# Patient Record
Sex: Female | Born: 1940
Health system: Southern US, Community
[De-identification: ages and names within clinical notes are randomized; demographics above are authoritative.]

## PROBLEM LIST (undated history)

## (undated) DIAGNOSIS — F329 Major depressive disorder, single episode, unspecified: Secondary | ICD-10-CM

## (undated) DIAGNOSIS — E785 Hyperlipidemia, unspecified: Secondary | ICD-10-CM

## (undated) DIAGNOSIS — F32A Depression, unspecified: Secondary | ICD-10-CM

## (undated) DIAGNOSIS — R413 Other amnesia: Secondary | ICD-10-CM

## (undated) DIAGNOSIS — I10 Essential (primary) hypertension: Secondary | ICD-10-CM

## (undated) HISTORY — DX: Hyperlipidemia, unspecified: E78.5

## (undated) HISTORY — DX: Depression, unspecified: F32.A

## (undated) HISTORY — DX: Major depressive disorder, single episode, unspecified: F32.9

## (undated) HISTORY — DX: Essential (primary) hypertension: I10

## (undated) HISTORY — DX: Other amnesia: R41.3

---

## 2004-11-10 ENCOUNTER — Ambulatory Visit: Payer: Self-pay | Admitting: Family Medicine

## 2005-11-30 ENCOUNTER — Ambulatory Visit: Payer: Self-pay | Admitting: Family Medicine

## 2005-12-26 ENCOUNTER — Emergency Department: Payer: Self-pay | Admitting: General Practice

## 2005-12-27 ENCOUNTER — Other Ambulatory Visit: Payer: Self-pay

## 2006-04-18 ENCOUNTER — Emergency Department: Payer: Self-pay | Admitting: Emergency Medicine

## 2006-07-07 ENCOUNTER — Ambulatory Visit: Payer: Self-pay | Admitting: Gastroenterology

## 2007-01-03 LAB — HM PAP SMEAR

## 2007-01-11 ENCOUNTER — Ambulatory Visit: Payer: Self-pay | Admitting: Family Medicine

## 2008-02-14 ENCOUNTER — Ambulatory Visit: Payer: Self-pay | Admitting: Family Medicine

## 2009-02-14 ENCOUNTER — Ambulatory Visit: Payer: Self-pay | Admitting: Family Medicine

## 2009-12-18 ENCOUNTER — Ambulatory Visit: Payer: Self-pay | Admitting: Internal Medicine

## 2010-01-16 ENCOUNTER — Ambulatory Visit: Payer: Self-pay | Admitting: Internal Medicine

## 2010-01-17 ENCOUNTER — Ambulatory Visit: Payer: Self-pay | Admitting: Internal Medicine

## 2010-01-27 ENCOUNTER — Ambulatory Visit: Payer: Self-pay | Admitting: Internal Medicine

## 2010-02-16 ENCOUNTER — Ambulatory Visit: Payer: Self-pay | Admitting: Family Medicine

## 2010-02-17 ENCOUNTER — Ambulatory Visit: Payer: Self-pay | Admitting: Internal Medicine

## 2010-03-18 ENCOUNTER — Ambulatory Visit: Payer: Self-pay | Admitting: Gastroenterology

## 2010-03-23 ENCOUNTER — Ambulatory Visit: Payer: Self-pay | Admitting: Gastroenterology

## 2010-04-23 ENCOUNTER — Ambulatory Visit: Payer: Self-pay | Admitting: Internal Medicine

## 2010-05-20 ENCOUNTER — Ambulatory Visit: Payer: Self-pay | Admitting: Internal Medicine

## 2010-07-23 ENCOUNTER — Ambulatory Visit: Payer: Self-pay | Admitting: Internal Medicine

## 2010-08-18 ENCOUNTER — Ambulatory Visit: Payer: Self-pay | Admitting: Internal Medicine

## 2010-08-20 ENCOUNTER — Ambulatory Visit: Payer: Self-pay | Admitting: Family Medicine

## 2010-08-21 ENCOUNTER — Ambulatory Visit: Payer: Self-pay | Admitting: Internal Medicine

## 2011-01-21 ENCOUNTER — Ambulatory Visit: Payer: Self-pay | Admitting: Internal Medicine

## 2011-02-16 ENCOUNTER — Ambulatory Visit: Payer: Self-pay | Admitting: Gastroenterology

## 2011-02-18 ENCOUNTER — Ambulatory Visit: Payer: Self-pay | Admitting: Internal Medicine

## 2011-04-26 ENCOUNTER — Ambulatory Visit: Payer: Self-pay | Admitting: Family Medicine

## 2011-07-22 ENCOUNTER — Ambulatory Visit: Payer: Self-pay | Admitting: Internal Medicine

## 2011-07-22 LAB — CBC CANCER CENTER
Basophil #: 0 x10 3/mm (ref 0.0–0.1)
Basophil %: 1.3 %
Eosinophil #: 0.1 x10 3/mm (ref 0.0–0.7)
Eosinophil %: 2 %
HCT: 42 % (ref 35.0–47.0)
HGB: 14.5 g/dL (ref 12.0–16.0)
Lymphocyte #: 1 x10 3/mm (ref 1.0–3.6)
Lymphocyte %: 28.9 %
MCHC: 34.6 g/dL (ref 32.0–36.0)
Monocyte #: 0.5 x10 3/mm (ref 0.0–0.7)
Neutrophil #: 1.9 x10 3/mm (ref 1.4–6.5)
Neutrophil %: 54.6 %
Platelet: 108 x10 3/mm — ABNORMAL LOW (ref 150–440)
RBC: 4.6 10*6/uL (ref 3.80–5.20)
RDW: 14.5 % (ref 11.5–14.5)
WBC: 3.5 x10 3/mm — ABNORMAL LOW (ref 3.6–11.0)

## 2011-07-22 LAB — COMPREHENSIVE METABOLIC PANEL
Alkaline Phosphatase: 58 U/L (ref 50–136)
Anion Gap: 8 (ref 7–16)
BUN: 9 mg/dL (ref 7–18)
Bilirubin,Total: 0.5 mg/dL (ref 0.2–1.0)
Chloride: 98 mmol/L (ref 98–107)
Creatinine: 0.61 mg/dL (ref 0.60–1.30)
EGFR (Non-African Amer.): >60
Glucose: 76 mg/dL (ref 65–99)
Sodium: 137 mmol/L (ref 136–145)
Total Protein: 7.6 g/dL (ref 6.4–8.2)

## 2011-08-18 ENCOUNTER — Ambulatory Visit: Payer: Self-pay | Admitting: Internal Medicine

## 2011-08-19 LAB — CBC CANCER CENTER
Basophil %: 0.6 %
Eosinophil #: 0.1 x10 3/mm (ref 0.0–0.7)
Eosinophil %: 1.7 %
HGB: 14.1 g/dL (ref 12.0–16.0)
Lymphocyte #: 1.2 x10 3/mm (ref 1.0–3.6)
Lymphocyte %: 28.3 %
MCV: 92 fL (ref 80–100)
Monocyte #: 0.6 x10 3/mm (ref 0.2–0.9)
Neutrophil #: 2.3 x10 3/mm (ref 1.4–6.5)
RBC: 4.6 10*6/uL (ref 3.80–5.20)
RDW: 13.7 % (ref 11.5–14.5)

## 2011-09-18 ENCOUNTER — Ambulatory Visit: Payer: Self-pay | Admitting: Internal Medicine

## 2011-10-20 ENCOUNTER — Ambulatory Visit: Payer: Self-pay | Admitting: Internal Medicine

## 2011-10-20 LAB — CBC CANCER CENTER
Basophil %: 0.6 %
Eosinophil #: 0.1 x10 3/mm (ref 0.0–0.7)
Eosinophil %: 1.8 %
HGB: 14.2 g/dL (ref 12.0–16.0)
Lymphocyte #: 0.8 x10 3/mm — ABNORMAL LOW (ref 1.0–3.6)
Lymphocyte %: 24.7 %
MCV: 92 fL (ref 80–100)
Monocyte #: 0.5 x10 3/mm (ref 0.2–0.9)
Neutrophil %: 58.9 %
Platelet: 107 x10 3/mm — ABNORMAL LOW (ref 150–440)
RBC: 4.65 10*6/uL (ref 3.80–5.20)
WBC: 3.3 x10 3/mm — ABNORMAL LOW (ref 3.6–11.0)

## 2011-10-20 LAB — HEPATIC FUNCTION PANEL A (ARMC)
Alkaline Phosphatase: 53 U/L (ref 50–136)
Bilirubin, Direct: 0.2 mg/dL (ref 0.00–0.20)
Bilirubin,Total: 0.6 mg/dL (ref 0.2–1.0)
SGOT(AST): 44 U/L — ABNORMAL HIGH (ref 15–37)
SGPT (ALT): 49 U/L
Total Protein: 7.5 g/dL (ref 6.4–8.2)

## 2011-11-18 ENCOUNTER — Ambulatory Visit: Payer: Self-pay | Admitting: Internal Medicine

## 2011-12-21 IMAGING — US ABDOMEN ULTRASOUND
1 series · 17 of 25 positions shown · non-contrast
Comparison: none

REASON FOR EXAM: abn lfts leg edema eval for liver or spleen abnormality
COMMENTS:

[Series 1: abdomen ultrasound · 17 of 55 slices shown]
[im 1/55]
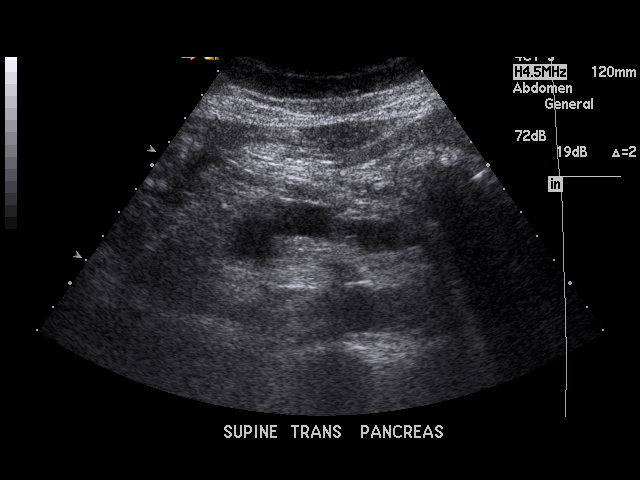
[im 5/55]
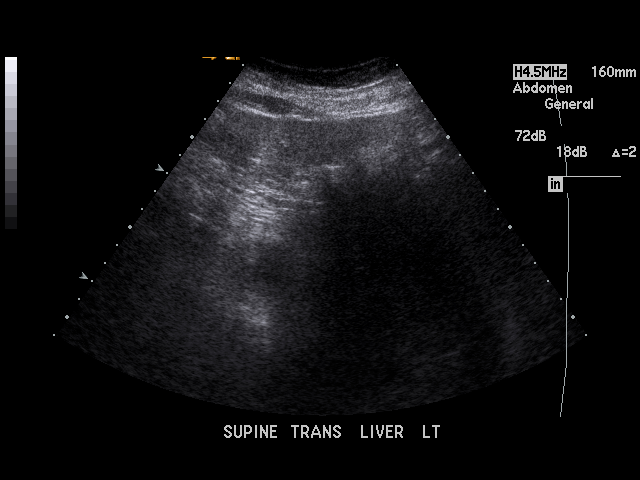
[im 7/55]
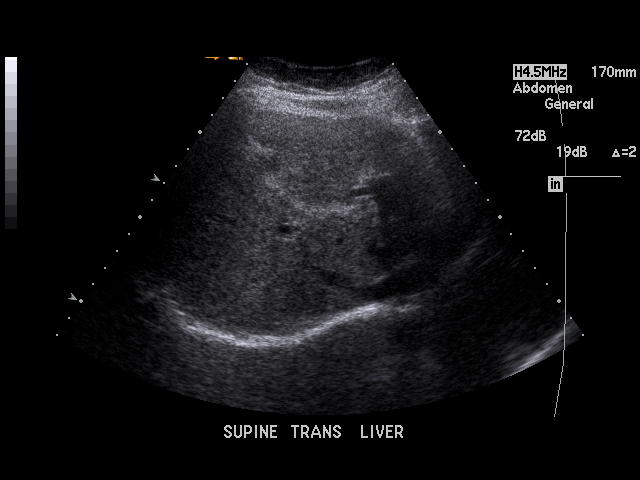
[im 12/55]
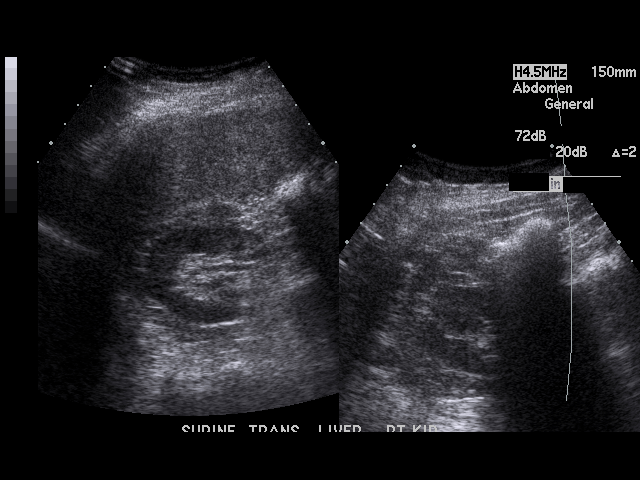
[im 14/55]
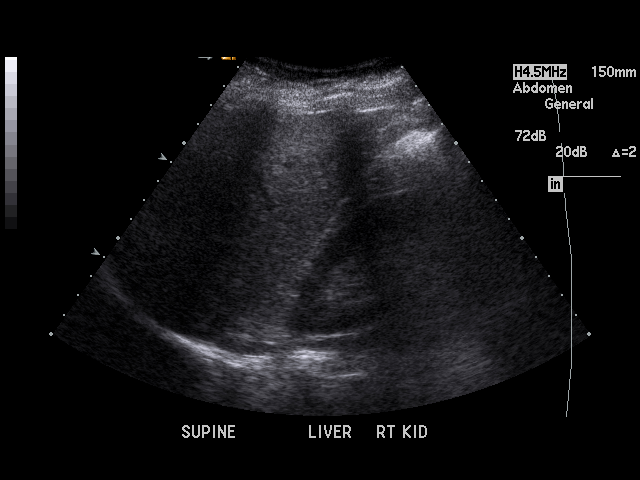
[im 19/55]
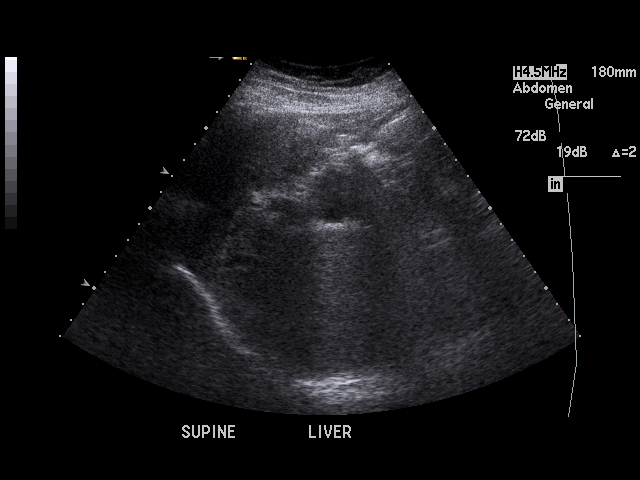
[im 21/55]
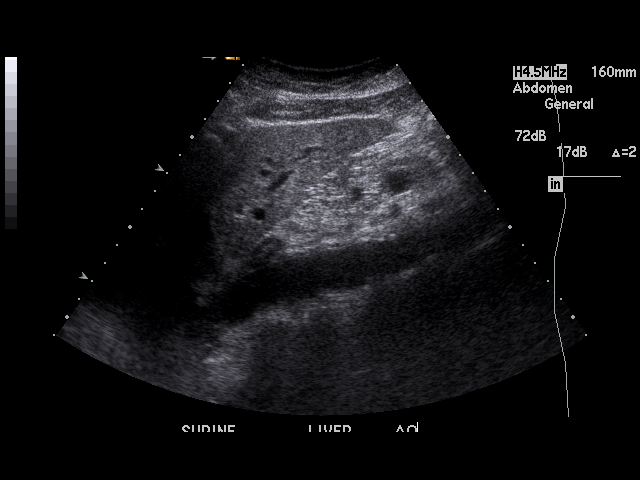
[im 25/55]
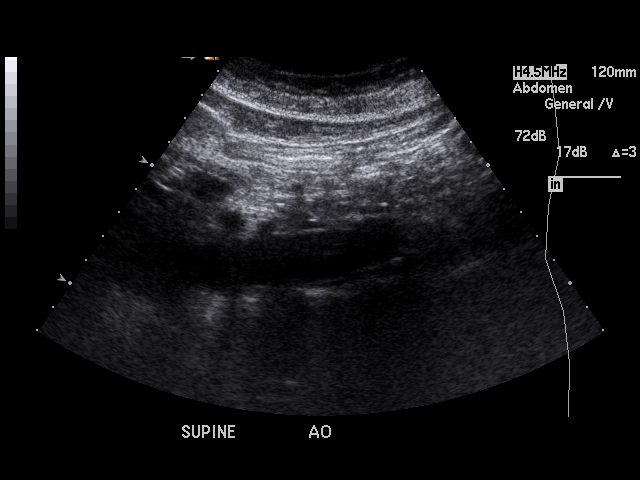
[im 28/55]
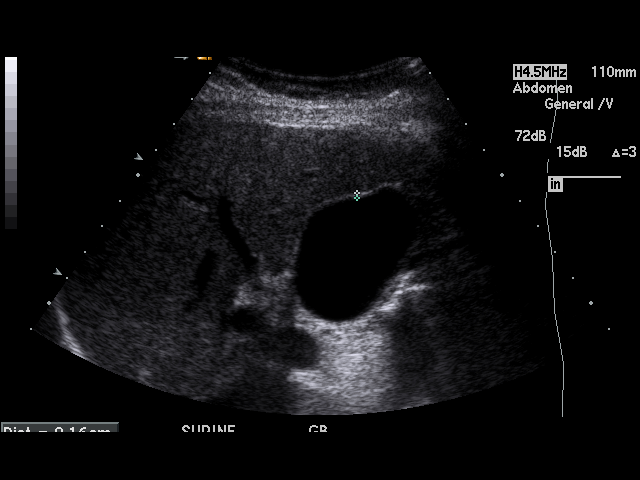
[im 30/55]
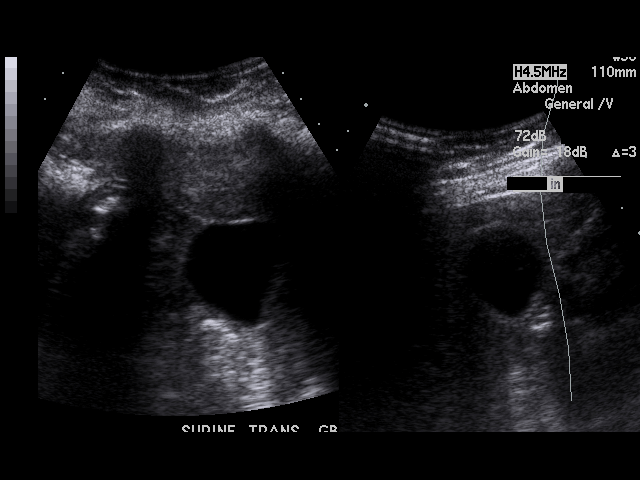
[im 34/55]
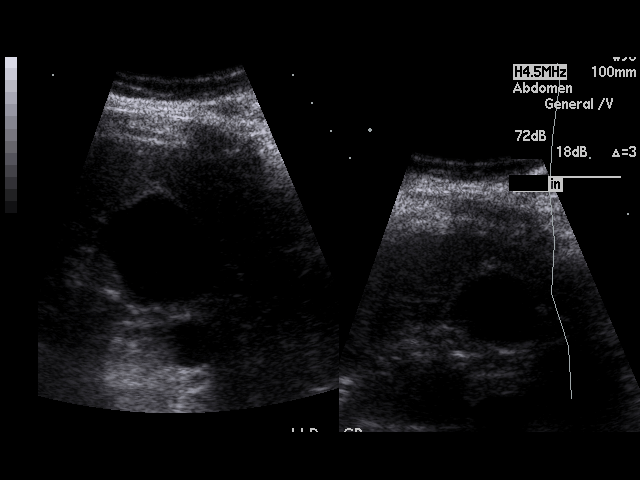
[im 37/55]
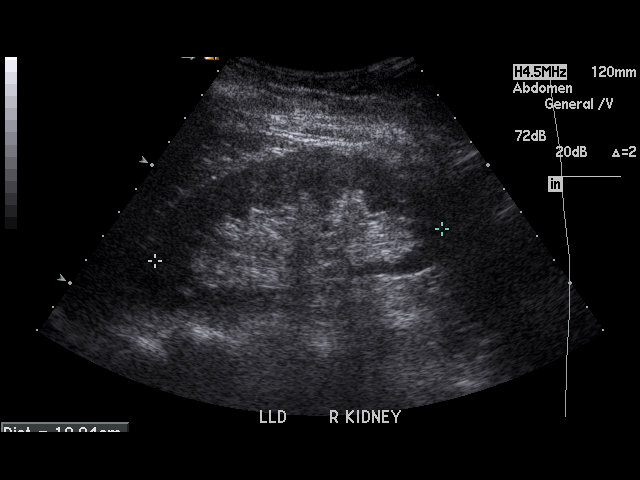
[im 41/55]
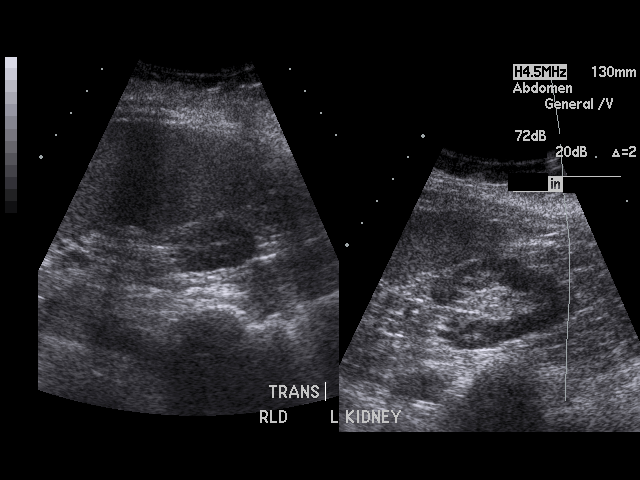
[im 43/55]
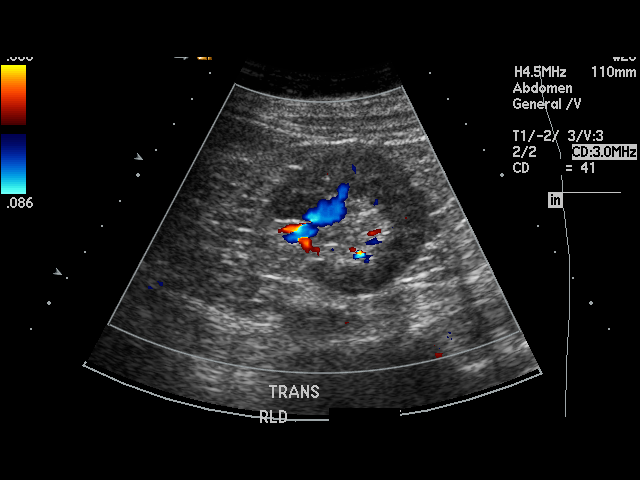
[im 48/55]
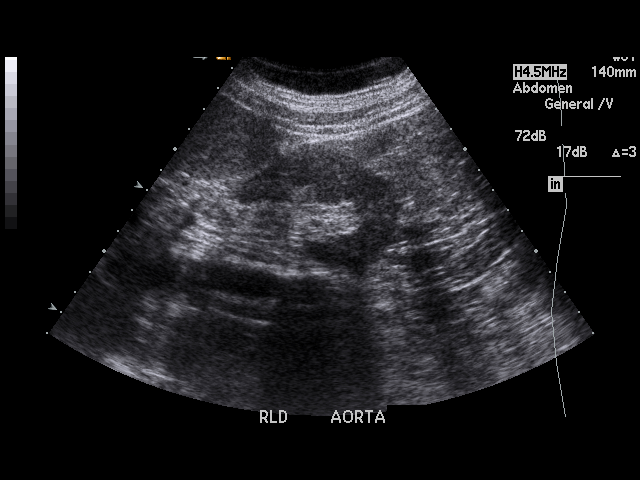
[im 50/55]
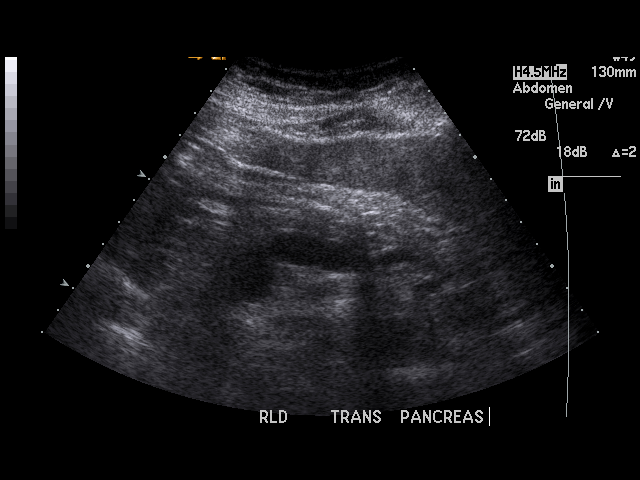
[im 55/55]
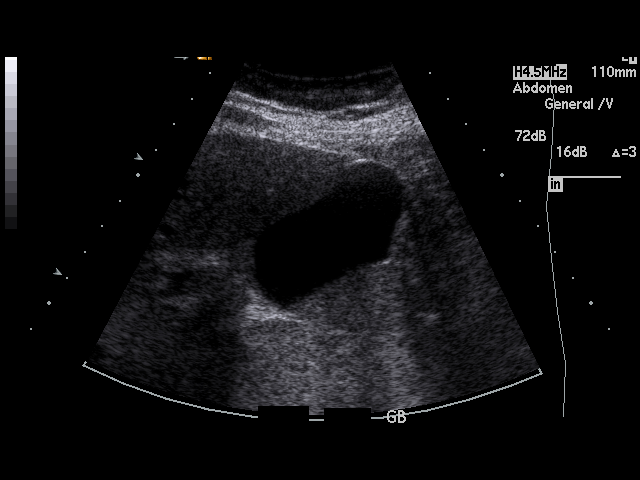

[17 of 25 positions shown; findings below may reference images not displayed]

PROCEDURE:     US  - US ABDOMEN GENERAL SURVEY  - January 27, 2010 [DATE]

RESULT:     The liver exhibits mildly increased echotexture which suggests
fatty infiltrative change. There is no focal mass nor ductal dilation.
Portal venous flow is normal in direction toward the liver. The gallbladder
is adequately distended with no evidence of stones, wall thickening, or
pericholecystic fluid. There is no positive sonographic Murphy's sign. The
common bile duct is normal at just under 3 mm in diameter. Portions of the
pancreas are obscured by bowel gas but the visualized portions appear normal.

There is splenomegaly with maximal measured dimension of 17 centimeters. The
abdominal aorta appears normal in caliber. The kidneys are normal in size
and contour with no evidence of obstruction.
IMPRESSION: 1. There is splenomegaly.
2. There are changes that suggests fatty infiltration of the liver.
3. I see no evidence of gallstones or common bile ductal dilation.

## 2011-12-28 IMAGING — CT CT ABD-PELV W/ CM
1 of 2 series · 15 of 32 positions shown, 19 images · non-contrast
Comparison: none

REASON FOR EXAM: Hx Abn LFT Splenomegaly Eval Liver Disease or Adenopathy
COMMENTS:

PROCEDURE:     KCT - KCT ABDOMEN/PELVIS W  - February 03, 2010 [DATE]
RESULT:
TECHNIQUE: Helical 5 mm sections are obtained from the lung bases through
the pubic symphysis status post intravenous administration of 85 mL
[id number] and oral contrast.

[Series 2: abd with 5.0 i40f · axial · 0.80mm/px · z∈[-1046,-641]mm · 15 of 89 slices shown, 19 images]
[im 4/89  soft-tissue]
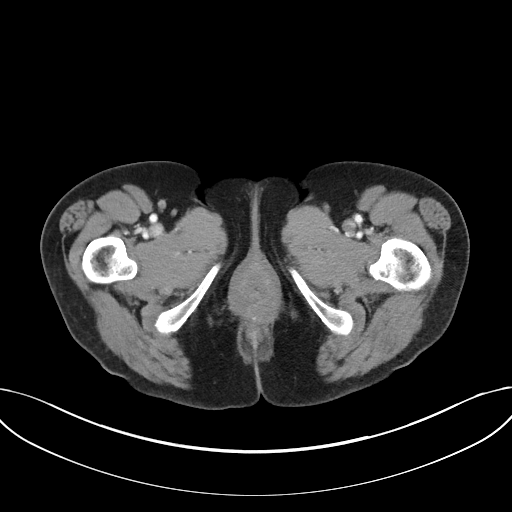
[im 4/89  bone]
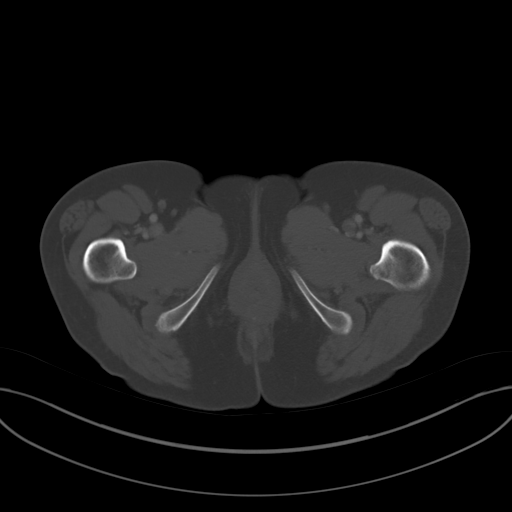
[im 12/89  soft-tissue]
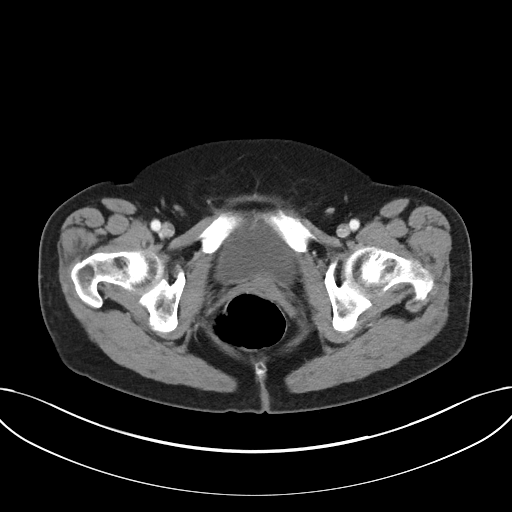
[im 19/89  soft-tissue]
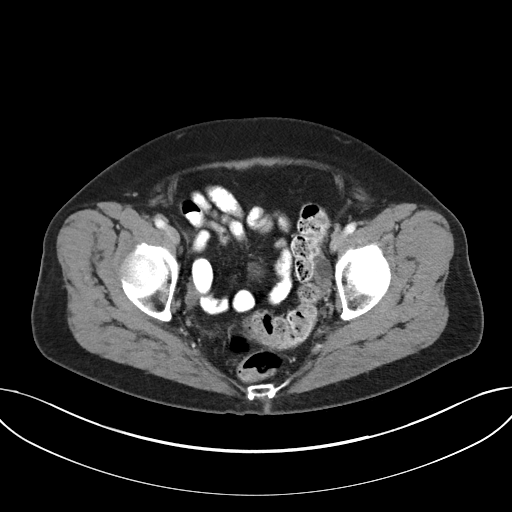
[im 26/89  soft-tissue]
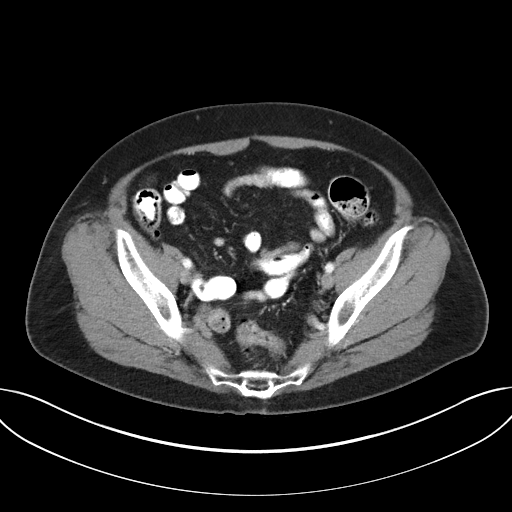
[im 30/89  soft-tissue]
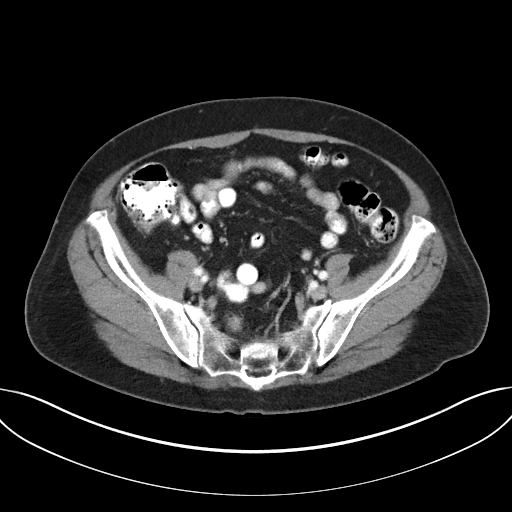
[im 37/89  soft-tissue]
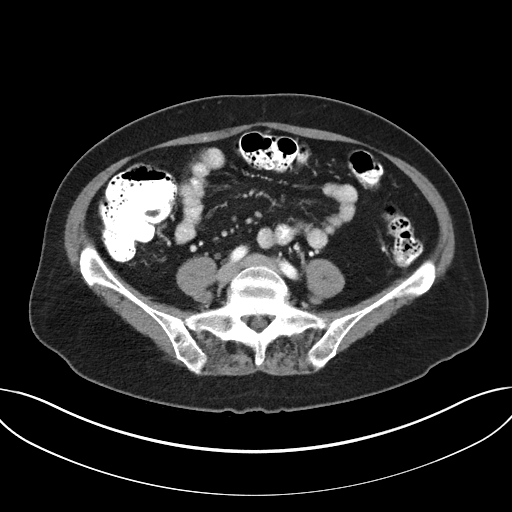
[im 45/89  soft-tissue]
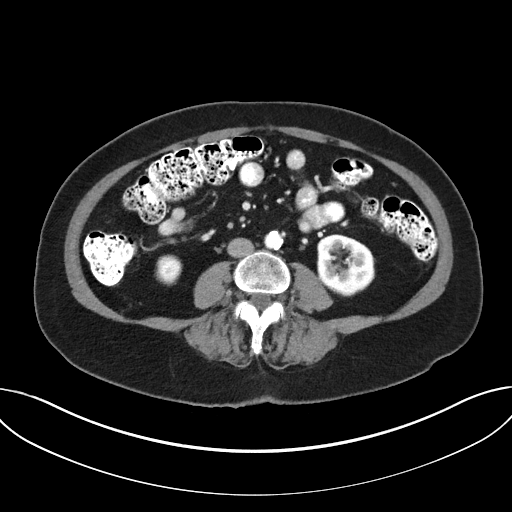
[im 52/89  soft-tissue]
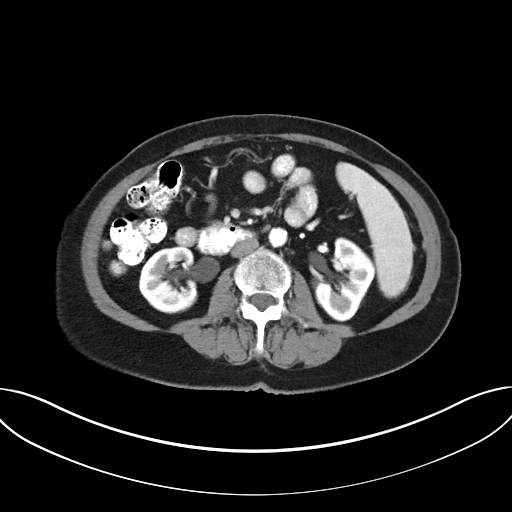
[im 59/89  soft-tissue]
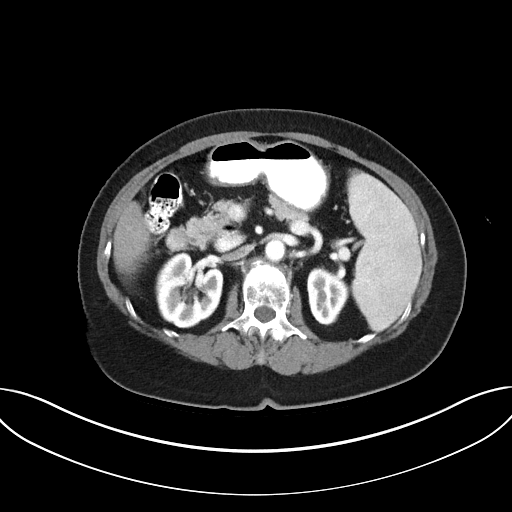
[im 59/89  bone]
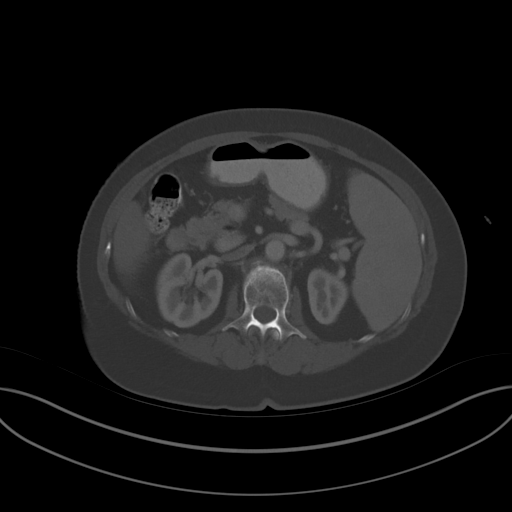
[im 63/89  soft-tissue]
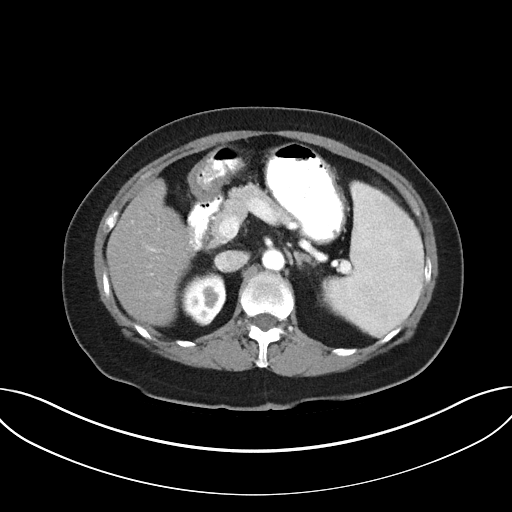
[im 70/89  soft-tissue]
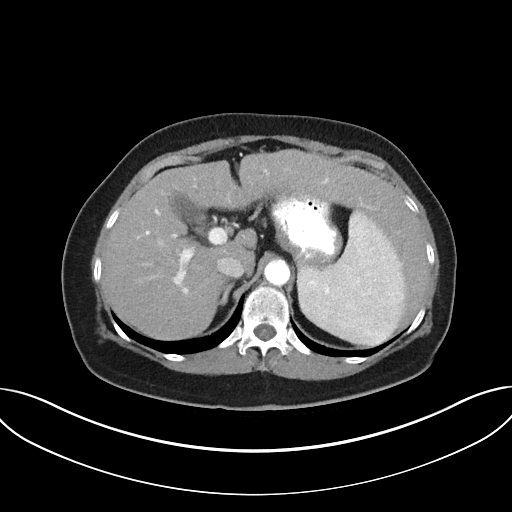
[im 74/89  lung]
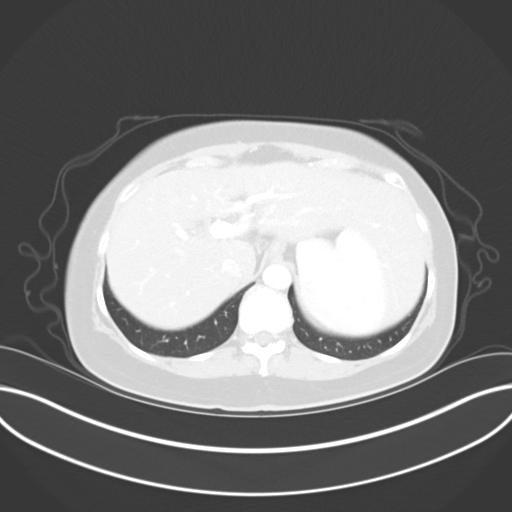
[im 78/89  soft-tissue]
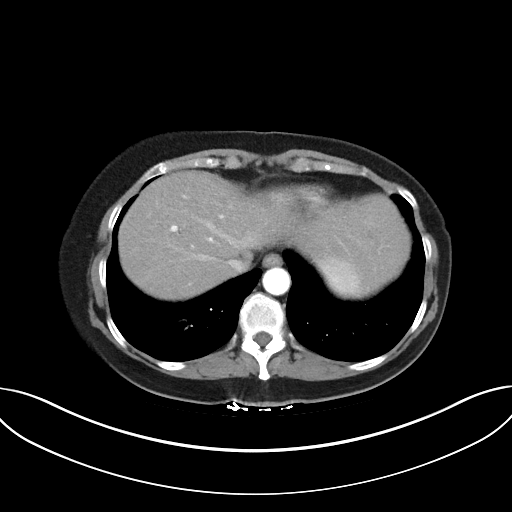
[im 78/89  lung]
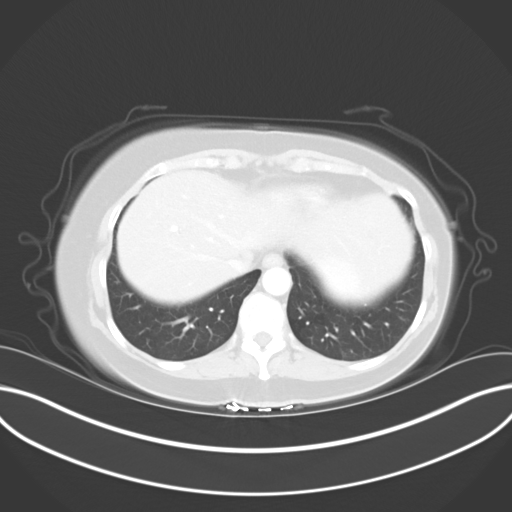
[im 81/89  lung]
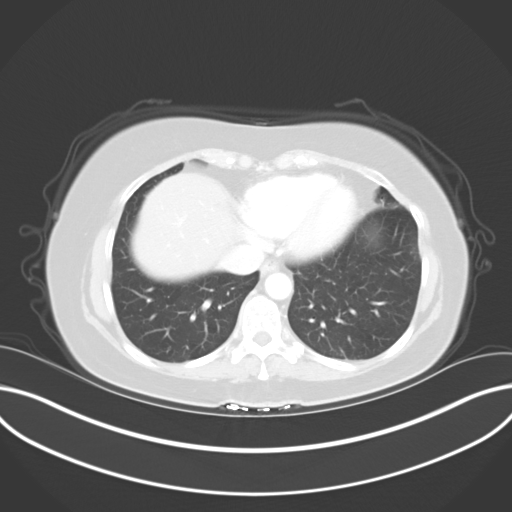
[im 85/89  soft-tissue]
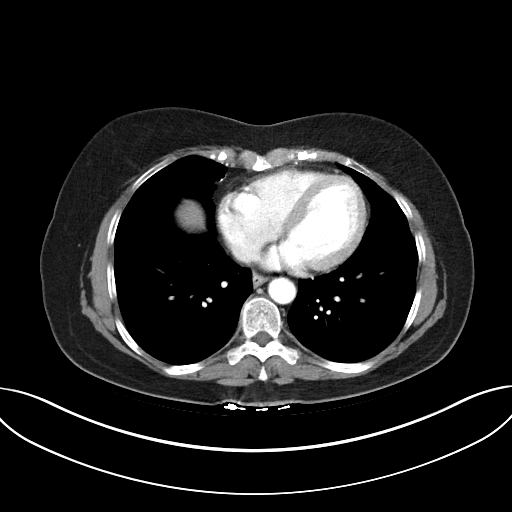
[im 85/89  lung]
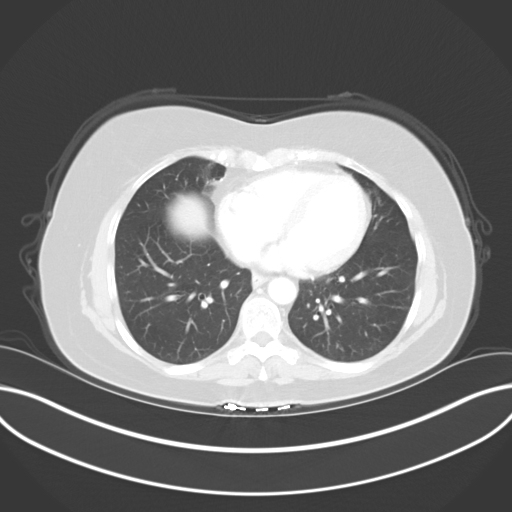

[15 of 32 positions shown; findings below may reference images not displayed]

FINDINGS: Evaluation of the lung bases demonstrates no gross abnormalities.

The liver is enlarged measuring approximately 24.86 cm in transverse
dimensions. The liver demonstrates a homogenous architecture with mild
diffuse low attenuation. The left lobe of the liver extends to the lateral
to posterior portion of the left upper quadrant. There is no evidence of
definite masses or contour abnormalities within the liver. The liver does
demonstrate a mild diffuse low attenuating architecture without further
architectural abnormality. The spleen is enlarged at 13.2 cm. The kidneys,
adrenals and pancreas is unremarkable.  Bilateral extrarenal pelves are
identified. There is no CT evidence of bowel obstruction or secondary signs
reflecting enteritis, colitis nor diverticulitis. There is no evidence of an
abdominal aortic aneurysm. There is no evidence of abdominal or pelvic
masses, free fluid or loculated fluid collections. There is no evidence of
bowel obstruction.
IMPRESSION: Hepatosplenomegaly as described above. There is no evidence of hepatic or
splenic masses. There are findings which may represent the sequela of a
component of hepatic steatosis.

## 2012-01-20 ENCOUNTER — Ambulatory Visit: Payer: Self-pay | Admitting: Internal Medicine

## 2012-01-20 LAB — CBC CANCER CENTER
Basophil #: 0 x10 3/mm (ref 0.0–0.1)
Basophil %: 0.8 %
Eosinophil #: 0.1 x10 3/mm (ref 0.0–0.7)
HCT: 42.3 % (ref 35.0–47.0)
HGB: 14 g/dL (ref 12.0–16.0)
Lymphocyte %: 27.2 %
MCH: 30.3 pg (ref 26.0–34.0)
MCHC: 33.1 g/dL (ref 32.0–36.0)
Monocyte #: 0.5 x10 3/mm (ref 0.2–0.9)
Monocyte %: 13.8 %
Neutrophil #: 1.9 x10 3/mm (ref 1.4–6.5)
Neutrophil %: 56.6 %
Platelet: 97 x10 3/mm — ABNORMAL LOW (ref 150–440)
RDW: 13.7 % (ref 11.5–14.5)

## 2012-02-18 ENCOUNTER — Ambulatory Visit: Payer: Self-pay | Admitting: Internal Medicine

## 2012-03-29 ENCOUNTER — Ambulatory Visit: Payer: Self-pay | Admitting: Family Medicine

## 2012-03-29 LAB — HM DEXA SCAN

## 2012-04-06 ENCOUNTER — Ambulatory Visit: Payer: Self-pay | Admitting: Internal Medicine

## 2012-04-06 LAB — HEPATIC FUNCTION PANEL A (ARMC)
Albumin: 4 g/dL (ref 3.4–5.0)
SGOT(AST): 45 U/L — ABNORMAL HIGH (ref 15–37)
SGPT (ALT): 51 U/L (ref 12–78)

## 2012-04-06 LAB — CBC CANCER CENTER
Basophil #: 0 x10 3/mm (ref 0.0–0.1)
Basophil %: 0.9 %
Eosinophil #: 0.1 x10 3/mm (ref 0.0–0.7)
HCT: 41.7 % (ref 35.0–47.0)
HGB: 14.3 g/dL (ref 12.0–16.0)
Lymphocyte %: 27.2 %
MCH: 30.7 pg (ref 26.0–34.0)
MCHC: 34.2 g/dL (ref 32.0–36.0)
Monocyte #: 0.5 x10 3/mm (ref 0.2–0.9)
Neutrophil #: 2.2 x10 3/mm (ref 1.4–6.5)
RBC: 4.65 10*6/uL (ref 3.80–5.20)
RDW: 13.7 % (ref 11.5–14.5)

## 2012-04-19 ENCOUNTER — Ambulatory Visit: Payer: Self-pay | Admitting: Internal Medicine

## 2012-05-01 ENCOUNTER — Ambulatory Visit: Payer: Self-pay | Admitting: Family Medicine

## 2012-06-17 ENCOUNTER — Ambulatory Visit: Payer: Self-pay | Admitting: Internal Medicine

## 2012-07-13 LAB — CBC CANCER CENTER
Basophil #: 0 x10 3/mm (ref 0.0–0.1)
Eosinophil %: 2.5 %
Lymphocyte #: 0.9 x10 3/mm — ABNORMAL LOW (ref 1.0–3.6)
Lymphocyte %: 24.9 %
MCHC: 34.5 g/dL (ref 32.0–36.0)
Monocyte #: 0.5 x10 3/mm (ref 0.2–0.9)
Monocyte %: 13.9 %
Neutrophil %: 57.7 %
RDW: 13.9 % (ref 11.5–14.5)
WBC: 3.7 x10 3/mm (ref 3.6–11.0)

## 2012-07-18 ENCOUNTER — Ambulatory Visit: Payer: Self-pay | Admitting: Internal Medicine

## 2012-10-04 ENCOUNTER — Ambulatory Visit: Payer: Self-pay | Admitting: Internal Medicine

## 2012-10-06 LAB — CBC CANCER CENTER
Basophil #: 0 x10 3/mm (ref 0.0–0.1)
Basophil %: 0.9 %
Eosinophil %: 3.8 %
HCT: 39.4 % (ref 35.0–47.0)
Lymphocyte #: 1 x10 3/mm (ref 1.0–3.6)
Lymphocyte %: 26.7 %
MCHC: 34.8 g/dL (ref 32.0–36.0)
Monocyte #: 0.4 x10 3/mm (ref 0.2–0.9)
Platelet: 97 x10 3/mm — ABNORMAL LOW (ref 150–440)
RBC: 4.35 10*6/uL (ref 3.80–5.20)
WBC: 3.8 x10 3/mm (ref 3.6–11.0)

## 2012-10-17 ENCOUNTER — Ambulatory Visit: Payer: Self-pay | Admitting: Internal Medicine

## 2013-01-11 ENCOUNTER — Ambulatory Visit: Payer: Self-pay | Admitting: Internal Medicine

## 2013-01-11 LAB — CBC CANCER CENTER
Basophil %: 0.8 %
Eosinophil %: 1.9 %
HCT: 39.1 % (ref 35.0–47.0)
Lymphocyte #: 1 x10 3/mm (ref 1.0–3.6)
Lymphocyte %: 23.6 %
Monocyte %: 12 %
Neutrophil %: 61.7 %
RBC: 4.3 10*6/uL (ref 3.80–5.20)
RDW: 13.9 % (ref 11.5–14.5)

## 2013-01-17 ENCOUNTER — Ambulatory Visit: Payer: Self-pay | Admitting: Internal Medicine

## 2013-02-06 ENCOUNTER — Ambulatory Visit: Payer: Self-pay | Admitting: Gastroenterology

## 2013-05-07 ENCOUNTER — Ambulatory Visit: Payer: Self-pay | Admitting: Family Medicine

## 2013-06-19 ENCOUNTER — Ambulatory Visit: Payer: Self-pay | Admitting: Gastroenterology

## 2013-06-19 LAB — CBC WITH DIFFERENTIAL/PLATELET
BASOS ABS: 0 10*3/uL (ref 0.0–0.1)
Basophil %: 0.9 %
Eosinophil #: 0.1 10*3/uL (ref 0.0–0.7)
Eosinophil %: 2.4 %
HCT: 44.1 % (ref 35.0–47.0)
HGB: 14.5 g/dL (ref 12.0–16.0)
LYMPHS PCT: 25.2 %
Lymphocyte #: 1.2 10*3/uL (ref 1.0–3.6)
MCH: 30.2 pg (ref 26.0–34.0)
MCHC: 32.8 g/dL (ref 32.0–36.0)
MCV: 92 fL (ref 80–100)
MONOS PCT: 9.2 %
Monocyte #: 0.4 x10 3/mm (ref 0.2–0.9)
Neutrophil #: 3 10*3/uL (ref 1.4–6.5)
Neutrophil %: 62.3 %
Platelet: 104 10*3/uL — ABNORMAL LOW (ref 150–440)
RBC: 4.78 10*6/uL (ref 3.80–5.20)
RDW: 14.3 % (ref 11.5–14.5)
WBC: 4.9 10*3/uL (ref 3.6–11.0)

## 2013-06-19 LAB — PROTIME-INR
INR: 1.1
Prothrombin Time: 14.2 secs (ref 11.5–14.7)

## 2013-06-19 LAB — HM COLONOSCOPY

## 2014-01-14 ENCOUNTER — Ambulatory Visit: Payer: Self-pay | Admitting: Gastroenterology

## 2014-02-21 LAB — LIPID PANEL
Cholesterol: 122 mg/dL (ref 0–200)
HDL: 59 mg/dL (ref 35–70)
LDL Cholesterol: 52 mg/dL
LDl/HDL Ratio: 0.9
Triglycerides: 53 mg/dL (ref 40–160)

## 2014-02-21 LAB — BASIC METABOLIC PANEL
BUN: 10 mg/dL (ref 4–21)
Creatinine: 0.5 mg/dL (ref 0.5–1.1)
GLUCOSE: 92 mg/dL
Potassium: 4.2 mmol/L (ref 3.4–5.3)
Sodium: 137 mmol/L (ref 137–147)

## 2014-02-21 LAB — HEPATIC FUNCTION PANEL
ALK PHOS: 32 U/L (ref 25–125)
ALT: 28 U/L (ref 7–35)
AST: 34 U/L (ref 13–35)
Bilirubin, Total: 0.6 mg/dL

## 2014-05-21 LAB — CBC AND DIFFERENTIAL
HEMATOCRIT: 42 % (ref 36–46)
HEMOGLOBIN: 14.7 g/dL (ref 12.0–16.0)
Neutrophils Absolute: 3 /uL
PLATELETS: 114 10*3/uL — AB (ref 150–399)
WBC: 5.9 10*3/mL

## 2014-05-31 ENCOUNTER — Ambulatory Visit: Payer: Self-pay | Admitting: Family Medicine

## 2014-05-31 LAB — HM MAMMOGRAPHY

## 2014-07-25 DIAGNOSIS — E785 Hyperlipidemia, unspecified: Secondary | ICD-10-CM | POA: Insufficient documentation

## 2014-07-25 DIAGNOSIS — Z8679 Personal history of other diseases of the circulatory system: Secondary | ICD-10-CM | POA: Insufficient documentation

## 2014-07-29 ENCOUNTER — Ambulatory Visit
Admit: 2014-07-29 | Disposition: A | Discharge status: Home / Self Care | Payer: Self-pay | Attending: Gastroenterology | Admitting: Gastroenterology

## 2014-08-01 LAB — TSH: TSH: 0.42 u[IU]/mL (ref <=5.90)

## 2014-10-17 ENCOUNTER — Encounter: Payer: Self-pay | Admitting: Emergency Medicine

## 2014-10-17 DIAGNOSIS — M810 Age-related osteoporosis without current pathological fracture: Secondary | ICD-10-CM | POA: Insufficient documentation

## 2014-10-17 DIAGNOSIS — I10 Essential (primary) hypertension: Secondary | ICD-10-CM | POA: Insufficient documentation

## 2014-10-17 DIAGNOSIS — F329 Major depressive disorder, single episode, unspecified: Secondary | ICD-10-CM | POA: Insufficient documentation

## 2014-10-17 DIAGNOSIS — E559 Vitamin D deficiency, unspecified: Secondary | ICD-10-CM | POA: Insufficient documentation

## 2014-10-17 DIAGNOSIS — D72819 Decreased white blood cell count, unspecified: Secondary | ICD-10-CM | POA: Insufficient documentation

## 2014-10-17 DIAGNOSIS — E782 Mixed hyperlipidemia: Secondary | ICD-10-CM | POA: Insufficient documentation

## 2014-10-17 DIAGNOSIS — E039 Hypothyroidism, unspecified: Secondary | ICD-10-CM | POA: Insufficient documentation

## 2014-10-17 DIAGNOSIS — K760 Fatty (change of) liver, not elsewhere classified: Secondary | ICD-10-CM | POA: Insufficient documentation

## 2014-10-17 DIAGNOSIS — F32A Depression, unspecified: Secondary | ICD-10-CM | POA: Insufficient documentation

## 2014-10-17 DIAGNOSIS — I8390 Asymptomatic varicose veins of unspecified lower extremity: Secondary | ICD-10-CM | POA: Insufficient documentation

## 2014-10-17 DIAGNOSIS — J309 Allergic rhinitis, unspecified: Secondary | ICD-10-CM | POA: Insufficient documentation

## 2014-10-17 DIAGNOSIS — R161 Splenomegaly, not elsewhere classified: Secondary | ICD-10-CM | POA: Insufficient documentation

## 2014-10-17 DIAGNOSIS — D649 Anemia, unspecified: Secondary | ICD-10-CM | POA: Insufficient documentation

## 2014-10-17 DIAGNOSIS — E041 Nontoxic single thyroid nodule: Secondary | ICD-10-CM | POA: Insufficient documentation

## 2014-10-28 ENCOUNTER — Other Ambulatory Visit: Payer: Self-pay | Admitting: Family Medicine

## 2014-12-05 ENCOUNTER — Ambulatory Visit (INDEPENDENT_AMBULATORY_CARE_PROVIDER_SITE_OTHER): Payer: Medicare Other | Admitting: Family Medicine

## 2014-12-05 ENCOUNTER — Encounter: Payer: Self-pay | Admitting: Family Medicine

## 2014-12-05 VITALS — BP 134/62 | HR 64 | Temp 97.7°F | Resp 12 | Ht 60.75 in | Wt 123.0 lb

## 2014-12-05 DIAGNOSIS — M810 Age-related osteoporosis without current pathological fracture: Secondary | ICD-10-CM

## 2014-12-05 DIAGNOSIS — Z124 Encounter for screening for malignant neoplasm of cervix: Secondary | ICD-10-CM

## 2014-12-05 DIAGNOSIS — Z0000 Encounter for general adult medical examination without abnormal findings: Secondary | ICD-10-CM

## 2014-12-05 NOTE — Progress Notes (Signed)
Patient ID: Janani Chamber Delbridge, female   DOB: [birth year], 74 y.o.   MRN: [id number] Patient: LAKETHIA COPPESS, Female    DOB: June 06, 1940, 74 y.o.   MRN: [id number] Visit Date: 12/05/2014  Today's Provider: [name], MD   Chief Complaint  Patient presents with  . Medicare Wellness   Subjective:   Gabryella C Wachtel is a 74 y.o. female who presents today for her Subsequent Annual Wellness Visit. She feels well. She reports exercising x 7 walking-mostly daily. She reports she is sleeping well.  LAST: Mamogram 05/07/13-per patient she had one Jan or Feb in 2016.  Colonoscopy-06/19/13-diverticulosis otherwise normal exam, repeat in 10 years.  EKG-01/07/10  BMD- 03/29/12 progression of osteoporosis  Labs-02/21/14, TSH was done also on 08/01/14 and Synthroid was decreased to 88 mcg.  Pap smear-could not find a record with [place] but patient states she never had hysterectomy or abnormal pap smears as far as she remembers.    Review of Systems  Constitutional: Negative.   HENT: Negative.   Eyes: Negative.   Respiratory: Negative.   Cardiovascular: Negative.   Gastrointestinal: Negative.   Endocrine: Negative.   Genitourinary: Negative.   Musculoskeletal: Negative.   Skin: Negative.   Allergic/Immunologic: Negative.   Neurological: Negative.   Hematological: Negative.   Psychiatric/Behavioral: Negative.     Patient Active Problem List   Diagnosis Date Noted  . Allergic rhinitis 10/17/2014  . Absolute anemia 10/17/2014  . Cyst of thyroid 10/17/2014  . Clinical depression 10/17/2014  . Essential (primary) hypertension 10/17/2014  . Fatty infiltration of liver 10/17/2014  . Combined fat and carbohydrate induced hyperlipemia 10/17/2014  . Acquired hypothyroidism 10/17/2014  . Decreased leukocytes 10/17/2014  . OP (osteoporosis) 10/17/2014  . Enlargement of spleen 10/17/2014  . Phlebectasia 10/17/2014  . Avitaminosis D 10/17/2014  . HLD (hyperlipidemia) 07/25/2014    Social History   Social  History  . Marital Status: Married    Spouse Name: Alfredo Bach  . Number of Children: 2  . Years of Education: 16   Occupational History  . retired    Social History Main Topics  . Smoking status: Never Smoker   . Smokeless tobacco: Never Used  . Alcohol Use: No  . Drug Use: No  . Sexual Activity: No   Other Topics Concern  . Not on file   Social History Narrative    History reviewed. No pertinent past surgical history.  Her family history includes Cancer in her mother; Dementia in her father; Gout in her father; Heart disease in her brother, father, and mother; Hypertension in her father.    Outpatient Prescriptions Prior to Visit  Medication Sig Dispense Refill  . Ascorbic Acid 500 MG CAPS Take by mouth.    . Cholecalciferol 1000 UNITS TBDP Take by mouth.    . fluticasone (FLONASE) 50 MCG/ACT nasal spray Place into the nose.    . hydrochlorothiazide (HYDRODIURIL) 25 MG tablet Take by mouth.    . levothyroxine (SYNTHROID, LEVOTHROID) 88 MCG tablet TAKE 1 TABLET BY MOUTH DAILY 30 tablet 2  . losartan (COZAAR) 50 MG tablet Take by mouth.    . Magnesium 400 MG CAPS Take by mouth.    . Milk Thistle-Dand-Fennel-Licor (MILK THISTLE XTRA) CAPS Take by mouth.    . vitamin E 400 UNIT capsule Take by mouth.    . raloxifene (EVISTA) 60 MG tablet Take by mouth.     No facility-administered medications prior to visit.    No Known Allergies  Patient Care Team:  Richard Hulen Shouts., MD as PCP - General (Family Medicine)  Objective:   Vitals:  Filed Vitals:   12/05/14 0956  BP: 134/62  Pulse: 64  Temp: 97.7 F (36.5 C)  Resp: 12  Height: 5' 0.75" (1.543 m)  Weight: 123 lb (55.792 kg)    Physical Exam  Constitutional: She appears well-developed and well-nourished.  HENT:  Head: Normocephalic and atraumatic.  Right Ear: External ear normal.  Left Ear: External ear normal.  Nose: Nose normal.  Mouth/Throat: Oropharynx is clear and moist.  Eyes: Conjunctivae and EOM are  normal. Pupils are equal, round, and reactive to light.  Neck: Normal range of motion. Neck supple.  Cardiovascular: Normal rate, regular rhythm, normal heart sounds and intact distal pulses.   Pulmonary/Chest: Effort normal and breath sounds normal.  Abdominal: Soft. Bowel sounds are normal.  Genitourinary: Vagina normal and uterus normal.  Large cystocoele present.    Activities of Daily Living In your present state of health, do you have any difficulty performing the following activities: 12/05/2014  Hearing? N  Vision? N  Difficulty concentrating or making decisions? Y  Walking or climbing stairs? N  Dressing or bathing? N  Doing errands, shopping? N    Fall Risk Assessment Fall Risk  12/05/2014  Falls in the past year? No     Depression Screen PHQ 2/9 Scores 12/05/2014  PHQ - 2 Score 0    Cognitive Testing - 6-CIT    Year: 0 4 points  Month: 0 3 points  Memorize "Floyde Parkins, [address], [place]"  Time (within 1 hour:) 0 3 points  Count backwards from 20: 0 2 4 points  Name months of year: 0 2 4 points  Repeat Address: 0 2 4 6 8 10  points   Total Score: 3/28  Interpretation : Normal (0-7) Abnormal (8-28)    Assessment & Plan:  1. Medicare annual wellness visit, subsequent   2. Osteoporosis  - DG Bone Density; Future  3. Pap smear for cervical cancer screening Last PAP - Pap IG (Image Guided) I have done the exam and reviewed the above chart and it is accurate to the best of my knowledge.   Julieanne Manson MD [facility] 12/05/2014 9:58 AM  ------------------------------------------------------------------------------------------------------------

## 2014-12-08 LAB — PAP IG (IMAGE GUIDED): PAP Smear Comment: 0

## 2014-12-10 ENCOUNTER — Ambulatory Visit
Admission: RE | Admit: 2014-12-10 | Discharge: 2014-12-10 | Disposition: A | Discharge status: Home / Self Care | Payer: Medicare Other | Source: Ambulatory Visit | Attending: Family Medicine | Admitting: Family Medicine

## 2014-12-10 DIAGNOSIS — M810 Age-related osteoporosis without current pathological fracture: Secondary | ICD-10-CM | POA: Insufficient documentation

## 2014-12-12 ENCOUNTER — Other Ambulatory Visit: Payer: Self-pay | Admitting: Family Medicine

## 2014-12-22 ENCOUNTER — Other Ambulatory Visit: Payer: Self-pay | Admitting: Family Medicine

## 2014-12-24 NOTE — Telephone Encounter (Signed)
See refill request.

## 2014-12-26 ENCOUNTER — Other Ambulatory Visit: Payer: Self-pay

## 2014-12-26 MED ORDER — LEVOTHYROXINE SODIUM 88 MCG PO TABS: 88.0000 ug | ORAL_TABLET | Freq: Every day | ORAL | Status: DC

## 2014-12-26 NOTE — Telephone Encounter (Signed)
Fax from pharmacy for 90 day supply

## 2015-01-30 ENCOUNTER — Other Ambulatory Visit: Payer: Self-pay | Admitting: Gastroenterology

## 2015-01-30 DIAGNOSIS — K760 Fatty (change of) liver, not elsewhere classified: Secondary | ICD-10-CM

## 2015-02-04 ENCOUNTER — Ambulatory Visit (INDEPENDENT_AMBULATORY_CARE_PROVIDER_SITE_OTHER): Payer: Medicare Other | Admitting: Family Medicine

## 2015-02-04 ENCOUNTER — Encounter: Payer: Self-pay | Admitting: Family Medicine

## 2015-02-04 VITALS — BP 164/82 | HR 100 | Temp 98.5°F | Resp 12 | Wt 127.0 lb

## 2015-02-04 DIAGNOSIS — E785 Hyperlipidemia, unspecified: Secondary | ICD-10-CM

## 2015-02-04 DIAGNOSIS — D649 Anemia, unspecified: Secondary | ICD-10-CM

## 2015-02-04 DIAGNOSIS — Z23 Encounter for immunization: Secondary | ICD-10-CM

## 2015-02-04 DIAGNOSIS — I10 Essential (primary) hypertension: Secondary | ICD-10-CM | POA: Diagnosis not present

## 2015-02-04 DIAGNOSIS — E039 Hypothyroidism, unspecified: Secondary | ICD-10-CM | POA: Diagnosis not present

## 2015-02-04 MED ORDER — METOPROLOL SUCCINATE ER 25 MG PO TB24: 25.0000 mg | ORAL_TABLET | Freq: Every day | ORAL | Status: DC

## 2015-02-04 NOTE — Progress Notes (Signed)
Patient ID: Lauren Lawrence, female   DOB: 09-26-1940, 74 y.o.   MRN: [id number]    Subjective:  HPI  Hypertension follow up: Patient is not checking her B/P often but does maybe once a week or so. She states her readings last week were around 140s/60s, she use to have readings in 120s when she use to check it often. Tolerating medications well. She has stopped exercising/walking but she is working in the garden and keeps active. BP Readings from Last 3 Encounters:  02/04/15 164/82  12/05/14 134/62   Hypothyroidism follow up: Lab Results  Component Value Date   TSH 0.42 08/01/2014   Hypothyroidism follow up: Lab Results  Component Value Date   CHOL 122 02/21/2014   HDL 59 02/21/2014   LDLCALC 52 02/21/2014   TRIG 53 02/21/2014   Patient was seen cancer center and had her platelets checked there and she was released a while back since platelets were stable and she wanted to get this followed up with [place].  Prior to Admission medications   Medication Sig Start Date End Date Taking? Authorizing Provider  Ascorbic Acid 500 MG CAPS Take by mouth. 11/13/12  Yes Historical Provider, MD  Cholecalciferol 1000 UNITS TBDP Take by mouth.   Yes Historical Provider, MD  fluticasone (FLONASE) 50 MCG/ACT nasal spray Place into the nose. 11/28/13  Yes Historical Provider, MD  hydrochlorothiazide (HYDRODIURIL) 25 MG tablet TAKE 1 TABLET BY MOUTH EVERY DAY AS NEEDED 12/24/14  Yes [name], MD  levothyroxine (SYNTHROID, LEVOTHROID) 88 MCG tablet Take 1 tablet (88 mcg total) by mouth daily. 12/26/14  Yes Richard Hulen Shouts., MD  losartan (COZAAR) 50 MG tablet TAKE 1 TABLET BY MOUTH EVERY DAY 12/12/14  Yes [name], MD  Magnesium 400 MG CAPS Take by mouth. 11/13/12  Yes Historical Provider, MD  Milk Thistle-Dand-Fennel-Licor (MILK THISTLE XTRA) CAPS Take by mouth. 07/07/10  Yes Historical Provider, MD  vitamin E 400 UNIT capsule Take by mouth. 07/07/10  Yes Historical Provider, MD     Patient Active Problem List   Diagnosis Date Noted  . Allergic rhinitis 10/17/2014  . Absolute anemia 10/17/2014  . Cyst of thyroid 10/17/2014  . Clinical depression 10/17/2014  . Essential (primary) hypertension 10/17/2014  . Fatty infiltration of liver 10/17/2014  . Combined fat and carbohydrate induced hyperlipemia 10/17/2014  . Acquired hypothyroidism 10/17/2014  . Decreased leukocytes 10/17/2014  . OP (osteoporosis) 10/17/2014  . Enlargement of spleen 10/17/2014  . Phlebectasia 10/17/2014  . Avitaminosis D 10/17/2014  . HLD (hyperlipidemia) 07/25/2014    No past medical history on file.  Social History   Social History  . Marital Status: Married    Spouse Name: Alfredo Bach  . Number of Children: 2  . Years of Education: 16   Occupational History  . retired    Social History Main Topics  . Smoking status: Never Smoker   . Smokeless tobacco: Never Used  . Alcohol Use: No  . Drug Use: No  . Sexual Activity: No   Other Topics Concern  . Not on file   Social History Narrative    No Known Allergies  Review of Systems  Constitutional: Negative.   Respiratory: Negative.   Cardiovascular: Positive for leg swelling. Negative for chest pain and palpitations. Claudication: sometimes in her ankles.  Musculoskeletal: Positive for joint pain (sometimes in her knees).  Neurological: Negative for headaches.  Psychiatric/Behavioral: Hallucinations: due to husband health-possible dementia. The patient is nervous/anxious and has  insomnia (wakes up early and can not go back to sleep).     Immunization History  Administered Date(s) Administered  . Hepatitis B 03/04/2011, 04/08/2011  . Pneumococcal Conjugate-13 05/21/2014  . Pneumococcal Polysaccharide-23 06/29/2011  . Tdap 01/03/2007   Objective:  BP 164/82 mmHg  Pulse 100  Temp(Src) 98.5 F (36.9 C)  Resp 12  Wt 127 lb (57.607 kg)  Physical Exam  Constitutional: She is oriented to person, place, and time and  well-developed, well-nourished, and in no distress.  HENT:  Head: Normocephalic and atraumatic.  Right Ear: External ear normal.  Left Ear: External ear normal.  Nose: Nose normal.  Eyes: Conjunctivae are normal.  Neck: Neck supple.  Cardiovascular: Normal rate, regular rhythm and normal heart sounds.   Pulmonary/Chest: Effort normal and breath sounds normal.  Abdominal: Soft.  Neurological: She is oriented to person, place, and time.  Skin: Skin is dry.  Psychiatric: Mood, memory, affect and judgment normal.    Lab Results  Component Value Date   WBC 5.9 05/21/2014   HGB 14.7 05/21/2014   HCT 42 05/21/2014   PLT 114* 05/21/2014   GLUCOSE 76 07/22/2011   CHOL 122 02/21/2014   TRIG 53 02/21/2014   HDL 59 02/21/2014   LDLCALC 52 02/21/2014   TSH 0.42 08/01/2014   INR 1.1 06/19/2013    CMP     Component Value Date/Time   NA 137 02/21/2014   NA 137 07/22/2011 0952   K 4.2 02/21/2014   K 3.9 07/22/2011 0952   CL 98 07/22/2011 0952   CO2 31 07/22/2011 0952   GLUCOSE 76 07/22/2011 0952   BUN 10 02/21/2014   BUN 9 07/22/2011 0952   CREATININE 0.5 02/21/2014   CREATININE 0.61 07/22/2011 0952   CALCIUM 9.2 07/22/2011 0952   PROT 7.4 04/06/2012 0941   ALBUMIN 4.0 04/06/2012 0941   AST 34 02/21/2014   AST 45* 04/06/2012 0941   ALT 28 02/21/2014   ALT 51 04/06/2012 0941   ALKPHOS 32 02/21/2014   ALKPHOS 63 04/06/2012 0941   BILITOT 0.6 04/06/2012 0941   GFRNONAA >60 07/22/2011 0952   GFRAA >60 07/22/2011 0952    Assessment and Plan :  1. Essential (primary) hypertension Add Metoprolol and re check on the next visit.25 mg daily - CBC with Differential/Platelet - Comprehensive metabolic panel  2. Acquired hypothyroidism - TSH  3. HLD (hyperlipidemia) - Lipid Panel With LDL/HDL Ratio  4. Anemia, unspecified anemia type - CBC with Differential/Platelet  5. Need for influenza vaccination  - Flu vaccine HIGH DOSE PF (Fluzone High dose)  6. Osteoporosis BMD  in August shows osteoporosis still, patient was on Fosamax but for a long time so this was stopped not too long ago. Will follow up in 2 years and re check, for now advised patient to walk.       Julieanne Mansonichard Gilbert MD [facility] 02/04/2015 2:53 PM

## 2015-02-05 ENCOUNTER — Ambulatory Visit
Admission: RE | Admit: 2015-02-05 | Discharge: 2015-02-05 | Disposition: A | Discharge status: Home / Self Care | Payer: Medicare Other | Source: Ambulatory Visit | Attending: Gastroenterology | Admitting: Gastroenterology

## 2015-02-05 DIAGNOSIS — K760 Fatty (change of) liver, not elsewhere classified: Secondary | ICD-10-CM | POA: Diagnosis present

## 2015-02-05 DIAGNOSIS — R161 Splenomegaly, not elsewhere classified: Secondary | ICD-10-CM | POA: Insufficient documentation

## 2015-02-06 LAB — CBC WITH DIFFERENTIAL/PLATELET
Basophils Absolute: 0 10*3/uL (ref 0.0–0.2)
Basos: 0 %
EOS (ABSOLUTE): 0.1 10*3/uL (ref 0.0–0.4)
EOS: 2 %
Hematocrit: 41.2 % (ref 34.0–46.6)
Hemoglobin: 14 g/dL (ref 11.1–15.9)
IMMATURE GRANULOCYTES: 0 %
Immature Grans (Abs): 0 10*3/uL (ref 0.0–0.1)
Lymphocytes Absolute: 0.5 10*3/uL — ABNORMAL LOW (ref 0.7–3.1)
Lymphs: 13 %
MCH: 30.6 pg (ref 26.6–33.0)
MCHC: 34 g/dL (ref 31.5–35.7)
MCV: 90 fL (ref 79–97)
MONOS ABS: 0.4 10*3/uL (ref 0.1–0.9)
Monocytes: 11 %
NEUTROS PCT: 74 %
Neutrophils Absolute: 2.7 10*3/uL (ref 1.4–7.0)
PLATELETS: 78 10*3/uL — AB (ref 150–379)
RBC: 4.57 x10E6/uL (ref 3.77–5.28)
RDW: 15.3 % (ref 12.3–15.4)
WBC: 3.6 10*3/uL (ref 3.4–10.8)

## 2015-02-06 LAB — COMPREHENSIVE METABOLIC PANEL
ALK PHOS: 47 IU/L (ref 39–117)
ALT: 32 IU/L (ref 0–32)
AST: 35 IU/L (ref 0–40)
Albumin/Globulin Ratio: 2 (ref 1.1–2.5)
Albumin: 4.3 g/dL (ref 3.5–4.8)
BUN/Creatinine Ratio: 19 (ref 11–26)
BUN: 10 mg/dL (ref 8–27)
Bilirubin Total: 0.9 mg/dL (ref 0.0–1.2)
CO2: 28 mmol/L (ref 18–29)
CREATININE: 0.54 mg/dL — AB (ref 0.57–1.00)
Calcium: 9.5 mg/dL (ref 8.7–10.3)
Chloride: 103 mmol/L (ref 97–106)
GFR calc Af Amer: 108 mL/min/{1.73_m2} (ref >59)
GFR calc non Af Amer: 93 mL/min/{1.73_m2} (ref >59)
GLUCOSE: 95 mg/dL (ref 65–99)
Globulin, Total: 2.1 g/dL (ref 1.5–4.5)
Potassium: 5.3 mmol/L — ABNORMAL HIGH (ref 3.5–5.2)
SODIUM: 141 mmol/L (ref 136–144)
Total Protein: 6.4 g/dL (ref 6.0–8.5)

## 2015-02-06 LAB — LIPID PANEL WITH LDL/HDL RATIO
CHOLESTEROL TOTAL: 142 mg/dL (ref 100–199)
HDL: 60 mg/dL (ref >39)
LDL Calculated: 71 mg/dL (ref 0–99)
LDl/HDL Ratio: 1.2 ratio units (ref 0.0–3.2)
Triglycerides: 56 mg/dL (ref 0–149)
VLDL CHOLESTEROL CAL: 11 mg/dL (ref 5–40)

## 2015-02-06 LAB — TSH: TSH: 0.899 u[IU]/mL (ref 0.450–4.500)

## 2015-02-26 ENCOUNTER — Other Ambulatory Visit: Payer: Self-pay | Admitting: Family Medicine

## 2015-02-27 NOTE — Telephone Encounter (Signed)
See refill request.

## 2015-03-10 ENCOUNTER — Ambulatory Visit (INDEPENDENT_AMBULATORY_CARE_PROVIDER_SITE_OTHER): Payer: Medicare Other | Admitting: Family Medicine

## 2015-03-10 VITALS — BP 168/70 | HR 74 | Temp 98.0°F | Resp 16 | Wt 129.0 lb

## 2015-03-10 DIAGNOSIS — D696 Thrombocytopenia, unspecified: Secondary | ICD-10-CM

## 2015-03-10 DIAGNOSIS — M810 Age-related osteoporosis without current pathological fracture: Secondary | ICD-10-CM

## 2015-03-10 DIAGNOSIS — I10 Essential (primary) hypertension: Secondary | ICD-10-CM

## 2015-03-10 NOTE — Progress Notes (Signed)
Patient ID: Lauren DurhamMitzy C Regala, female   DOB: 1941/01/26, 74 y.o.   MRN: [id number]   Lauren DurhamMitzy C Elliott  MRN: [id number] DOB: 1941/01/26  Subjective:  HPI   1. Essential (primary) hypertension Patient is a 74 year old female who presents for follow up of her hypertension.  She was last seen on 02/04/15 and at that time her blood pressure was 164/82.  She was started on Metoprolol 25 mg daily in addition to her other medications.  She has been checking her blood pressure and states at at first her pressure was doing pretty good until last week when it started going back up.    2. Thrombocytopenia (HCC) Patient has had low platelets for several years.  She had been seen by the oncology department about 2 years ago. On her last labs it was noted that she needed referral to be done today.   Patient Active Problem List   Diagnosis Date Noted  . Allergic rhinitis 10/17/2014  . Absolute anemia 10/17/2014  . Cyst of thyroid 10/17/2014  . Clinical depression 10/17/2014  . Essential (primary) hypertension 10/17/2014  . Fatty infiltration of liver 10/17/2014  . Combined fat and carbohydrate induced hyperlipemia 10/17/2014  . Acquired hypothyroidism 10/17/2014  . Decreased leukocytes 10/17/2014  . OP (osteoporosis) 10/17/2014  . Enlargement of spleen 10/17/2014  . Phlebectasia 10/17/2014  . Avitaminosis D 10/17/2014  . HLD (hyperlipidemia) 07/25/2014    No past medical history on file.  Social History   Social History  . Marital Status: Married    Spouse Name: Alfredo BachCecil  . Number of Children: 2  . Years of Education: 16   Occupational History  . retired    Social History Main Topics  . Smoking status: Never Smoker   . Smokeless tobacco: Never Used  . Alcohol Use: No  . Drug Use: No  . Sexual Activity: No   Other Topics Concern  . Not on file   Social History Narrative    Outpatient Prescriptions Prior to Visit  Medication Sig Dispense Refill  . Ascorbic Acid 500 MG CAPS Take by mouth.     . Cholecalciferol 1000 UNITS TBDP Take by mouth.    . fluticasone (FLONASE) 50 MCG/ACT nasal spray USE 1-2 SQUIRTS EACH NOSTRIL DAILY 16 g 12  . hydrochlorothiazide (HYDRODIURIL) 25 MG tablet TAKE 1 TABLET BY MOUTH EVERY DAY AS NEEDED 30 tablet 5  . levothyroxine (SYNTHROID, LEVOTHROID) 88 MCG tablet Take 1 tablet (88 mcg total) by mouth daily. 90 tablet 2  . losartan (COZAAR) 50 MG tablet TAKE 1 TABLET BY MOUTH EVERY DAY 90 tablet 3  . Magnesium 400 MG CAPS Take by mouth.    . metoprolol succinate (TOPROL-XL) 25 MG 24 hr tablet Take 1 tablet (25 mg total) by mouth daily. 30 tablet 12  . Milk Thistle-Dand-Fennel-Licor (MILK THISTLE XTRA) CAPS Take by mouth.    . vitamin E 400 UNIT capsule Take by mouth.     No facility-administered medications prior to visit.    No Known Allergies  Review of Systems  Constitutional: Negative.   Eyes: Negative.   Respiratory: Negative for cough, hemoptysis, sputum production, shortness of breath and wheezing.   Cardiovascular: Negative for chest pain, palpitations, claudication and leg swelling.  Gastrointestinal: Negative.   Neurological: Negative for dizziness and headaches.  Psychiatric/Behavioral: Negative.    Objective:  BP 168/70 mmHg  Pulse 74  Temp(Src) 98 F (36.7 C) (Oral)  Resp 16  Wt 129 lb (58.514 kg)  Physical Exam  Constitutional: She is oriented to person, place, and time and well-developed, well-nourished, and in no distress.  HENT:  Head: Normocephalic and atraumatic.  Right Ear: External ear normal.  Left Ear: External ear normal.  Nose: Nose normal.  Eyes: Conjunctivae are normal.  Neck: Neck supple.  Cardiovascular: Normal rate, regular rhythm and normal heart sounds.   Pulmonary/Chest: Effort normal and breath sounds normal.  Abdominal: Soft.  Neurological: She is alert and oriented to person, place, and time.  Skin: Skin is warm and dry.  Psychiatric: Mood, memory, affect and judgment normal.    Assessment  and Plan :  Essential (primary) hypertension  Thrombocytopenia (HCC)  Depression Stable--husband with progressive dementia. Fatty Infiltrate of Liver--NASH Per Dr Marva Panda. Osteoporosis I have done the exam and reviewed the above chart and it is accurate to the best of my knowledge.  Julieanne Manson MD [facility] 03/10/2015 3:24 PM

## 2015-03-11 LAB — CBC WITH DIFFERENTIAL/PLATELET
BASOS: 0 %
Basophils Absolute: 0 10*3/uL (ref 0.0–0.2)
EOS (ABSOLUTE): 0.1 10*3/uL (ref 0.0–0.4)
Eos: 2 %
HEMATOCRIT: 41.3 % (ref 34.0–46.6)
HEMOGLOBIN: 13.6 g/dL (ref 11.1–15.9)
IMMATURE GRANS (ABS): 0 10*3/uL (ref 0.0–0.1)
Immature Granulocytes: 0 %
LYMPHS: 24 %
Lymphocytes Absolute: 0.9 10*3/uL (ref 0.7–3.1)
MCH: 30 pg (ref 26.6–33.0)
MCHC: 32.9 g/dL (ref 31.5–35.7)
MCV: 91 fL (ref 79–97)
MONOCYTES: 14 %
Monocytes Absolute: 0.5 10*3/uL (ref 0.1–0.9)
NEUTROS ABS: 2.1 10*3/uL (ref 1.4–7.0)
Neutrophils: 60 %
Platelets: 92 10*3/uL — CL (ref 150–379)
RBC: 4.54 x10E6/uL (ref 3.77–5.28)
RDW: 14.8 % (ref 12.3–15.4)
WBC: 3.6 10*3/uL (ref 3.4–10.8)

## 2015-05-12 ENCOUNTER — Ambulatory Visit (INDEPENDENT_AMBULATORY_CARE_PROVIDER_SITE_OTHER): Payer: Medicare Other | Admitting: Family Medicine

## 2015-05-12 ENCOUNTER — Encounter: Payer: Self-pay | Admitting: Family Medicine

## 2015-05-12 VITALS — BP 164/72 | HR 60 | Temp 97.8°F | Resp 16 | Wt 129.0 lb

## 2015-05-12 DIAGNOSIS — D696 Thrombocytopenia, unspecified: Secondary | ICD-10-CM

## 2015-05-12 DIAGNOSIS — I10 Essential (primary) hypertension: Secondary | ICD-10-CM | POA: Diagnosis not present

## 2015-05-12 DIAGNOSIS — F329 Major depressive disorder, single episode, unspecified: Secondary | ICD-10-CM | POA: Diagnosis not present

## 2015-05-12 DIAGNOSIS — F32A Depression, unspecified: Secondary | ICD-10-CM

## 2015-05-12 MED ORDER — LOSARTAN POTASSIUM 100 MG PO TABS: 100.0000 mg | ORAL_TABLET | Freq: Every day | ORAL | Status: DC

## 2015-05-12 NOTE — Progress Notes (Signed)
Patient ID: Lauren Lawrence, female   DOB: 08-28-1940, 75 y.o.   MRN: [id number]    Subjective:  HPI  Hypertension, follow-up:  BP Readings from Last 3 Encounters:  05/12/15 164/72  03/10/15 168/70  02/04/15 164/82    She was last seen for hypertension 2 months ago.  BP at that visit was 168/70. Management since that visit includes none, but at October visit Metoprolol was added. She reports good compliance with treatment. She is not having side effects.  She is exercising, but not consistently. She is adherent to low salt diet.   Outside blood pressures are running 120-140's- 60's. She is experiencing none.  Patient denies chest pain, chest pressure/discomfort, claudication, dyspnea, irregular heart beat, lower extremity edema, near-syncope and palpitations.   Cardiovascular risk factors include advanced age (older than 54 for men, 25 for women) and hypertension.  Wt Readings from Last 3 Encounters:  05/12/15 129 lb (58.514 kg)  03/10/15 129 lb (58.514 kg)  02/04/15 127 lb (57.607 kg)    ------------------------------------------------------------------------ Pt reports that she is doing ok, emotionally.   Thrombocytopenia- found on labs in October, repeat CBC in 3 months   Prior to Admission medications   Medication Sig Start Date End Date Taking? Authorizing Provider  Ascorbic Acid 500 MG CAPS Take by mouth. 11/13/12  Yes Historical Provider, MD  Cholecalciferol 1000 UNITS TBDP Take by mouth.   Yes Historical Provider, MD  fluticasone (FLONASE) 50 MCG/ACT nasal spray USE 1-2 SQUIRTS EACH NOSTRIL DAILY 02/27/15  Yes [name], MD  hydrochlorothiazide (HYDRODIURIL) 25 MG tablet TAKE 1 TABLET BY MOUTH EVERY DAY AS NEEDED 12/24/14  Yes [name], MD  levothyroxine (SYNTHROID, LEVOTHROID) 88 MCG tablet Take 1 tablet (88 mcg total) by mouth daily. 12/26/14  Yes Richard Hulen Shouts., MD  losartan (COZAAR) 50 MG tablet TAKE 1 TABLET BY MOUTH EVERY DAY 12/12/14   Yes [name], MD  Magnesium 400 MG CAPS Take by mouth. 11/13/12  Yes Historical Provider, MD  metoprolol succinate (TOPROL-XL) 25 MG 24 hr tablet Take 1 tablet (25 mg total) by mouth daily. 02/04/15  Yes Richard Hulen Shouts., MD  Milk Thistle-Dand-Fennel-Licor (MILK THISTLE XTRA) CAPS Take by mouth. 07/07/10  Yes Historical Provider, MD  raloxifene (EVISTA) 60 MG tablet Take 60 mg by mouth daily. 02/16/15  Yes Historical Provider, MD  vitamin E 400 UNIT capsule Take by mouth. 07/07/10  Yes Historical Provider, MD    Patient Active Problem List   Diagnosis Date Noted  . Allergic rhinitis 10/17/2014  . Absolute anemia 10/17/2014  . Cyst of thyroid 10/17/2014  . Clinical depression 10/17/2014  . Essential (primary) hypertension 10/17/2014  . Fatty infiltration of liver 10/17/2014  . Combined fat and carbohydrate induced hyperlipemia 10/17/2014  . Acquired hypothyroidism 10/17/2014  . Decreased leukocytes 10/17/2014  . OP (osteoporosis) 10/17/2014  . Enlargement of spleen 10/17/2014  . Phlebectasia 10/17/2014  . Avitaminosis D 10/17/2014  . HLD (hyperlipidemia) 07/25/2014    History reviewed. No pertinent past medical history.  Social History   Social History  . Marital Status: Married    Spouse Name: Alfredo Bach  . Number of Children: 2  . Years of Education: 16   Occupational History  . retired    Social History Main Topics  . Smoking status: Never Smoker   . Smokeless tobacco: Never Used  . Alcohol Use: No  . Drug Use: No  . Sexual Activity: No   Other Topics Concern  .  Not on file   Social History Narrative    No Known Allergies  Review of Systems  Constitutional: Negative.   HENT: Negative.   Eyes: Negative.   Respiratory: Negative.   Cardiovascular: Negative.   Gastrointestinal: Negative.   Genitourinary: Negative.   Musculoskeletal: Negative.   Skin: Negative.   Neurological: Negative.   Endo/Heme/Allergies: Negative.   Psychiatric/Behavioral:  Negative.     Immunization History  Administered Date(s) Administered  . Hepatitis B 03/04/2011, 04/08/2011  . Influenza, High Dose Seasonal PF 02/04/2015  . Pneumococcal Conjugate-13 05/21/2014  . Pneumococcal Polysaccharide-23 06/29/2011  . Tdap 01/03/2007   Objective:  BP 164/72 mmHg  Pulse 60  Temp(Src) 97.8 F (36.6 C) (Oral)  Resp 16  Wt 129 lb (58.514 kg)  Physical Exam  Constitutional: She is oriented to person, place, and time and well-developed, well-nourished, and in no distress.  Eyes: Conjunctivae and EOM are normal. Pupils are equal, round, and reactive to light.  Neck: Normal range of motion. Neck supple.  Cardiovascular: Normal rate, regular rhythm, normal heart sounds and intact distal pulses.   Pulmonary/Chest: Effort normal and breath sounds normal.  Musculoskeletal: Normal range of motion.  Neurological: She is alert and oriented to person, place, and time. She has normal reflexes. Gait normal. GCS score is 15.  Skin: Skin is warm and dry.  Psychiatric: Mood, memory, affect and judgment normal.    Lab Results  Component Value Date   WBC 3.6 03/10/2015   HGB 14.7 05/21/2014   HCT 41.3 03/10/2015   PLT 92* 03/10/2015   GLUCOSE 95 02/05/2015   CHOL 142 02/05/2015   TRIG 56 02/05/2015   HDL 60 02/05/2015   LDLCALC 71 02/05/2015   TSH 0.899 02/05/2015   INR 1.1 06/19/2013    CMP     Component Value Date/Time   NA 141 02/05/2015 0908   NA 137 07/22/2011 0952   K 5.3* 02/05/2015 0908   K 3.9 07/22/2011 0952   CL 103 02/05/2015 0908   CL 98 07/22/2011 0952   CO2 28 02/05/2015 0908   CO2 31 07/22/2011 0952   GLUCOSE 95 02/05/2015 0908   GLUCOSE 76 07/22/2011 0952   BUN 10 02/05/2015 0908   BUN 9 07/22/2011 0952   CREATININE 0.54* 02/05/2015 0908   CREATININE 0.5 02/21/2014   CREATININE 0.61 07/22/2011 0952   CALCIUM 9.5 02/05/2015 0908   CALCIUM 9.2 07/22/2011 0952   PROT 6.4 02/05/2015 0908   PROT 7.4 04/06/2012 0941   ALBUMIN 4.3  02/05/2015 0908   ALBUMIN 4.0 04/06/2012 0941   AST 35 02/05/2015 0908   AST 45* 04/06/2012 0941   ALT 32 02/05/2015 0908   ALT 51 04/06/2012 0941   ALKPHOS 47 02/05/2015 0908   ALKPHOS 63 04/06/2012 0941   BILITOT 0.9 02/05/2015 0908   BILITOT 0.6 04/06/2012 0941   GFRNONAA 93 02/05/2015 0908   GFRNONAA >60 07/22/2011 0952   GFRAA 108 02/05/2015 0908   GFRAA >60 07/22/2011 0952    Assessment and Plan :  1. Essential (primary) hypertension Still elevated, increase Losartan to 100 mg follow up in 2-3 months. - losartan (COZAAR) 100 MG tablet; Take 1 tablet (100 mg total) by mouth daily.  Dispense: 30 tablet; Refill: 12  2. Clinical depression Stable.  3. Thrombocytopenia (HCC)  - CBC with Differential/Platelet  Patient was seen and examined by Dr. Julieanne Manson, and noted scribed by Dimas Chyle, CMA  Julieanne Manson MD [facility] 05/12/2015 9:53  AM

## 2015-05-13 ENCOUNTER — Telehealth: Payer: Self-pay

## 2015-05-13 LAB — CBC WITH DIFFERENTIAL/PLATELET
BASOS: 1 %
Basophils Absolute: 0 10*3/uL (ref 0.0–0.2)
EOS (ABSOLUTE): 0.1 10*3/uL (ref 0.0–0.4)
EOS: 2 %
HEMATOCRIT: 39.8 % (ref 34.0–46.6)
Hemoglobin: 13.4 g/dL (ref 11.1–15.9)
IMMATURE GRANULOCYTES: 0 %
Immature Grans (Abs): 0 10*3/uL (ref 0.0–0.1)
LYMPHS ABS: 0.9 10*3/uL (ref 0.7–3.1)
Lymphs: 22 %
MCH: 30.3 pg (ref 26.6–33.0)
MCHC: 33.7 g/dL (ref 31.5–35.7)
MCV: 90 fL (ref 79–97)
MONOS ABS: 0.5 10*3/uL (ref 0.1–0.9)
Monocytes: 13 %
NEUTROS PCT: 62 %
Neutrophils Absolute: 2.7 10*3/uL (ref 1.4–7.0)
PLATELETS: 100 10*3/uL — AB (ref 150–379)
RBC: 4.42 x10E6/uL (ref 3.77–5.28)
RDW: 14.3 % (ref 12.3–15.4)
WBC: 4.2 10*3/uL (ref 3.4–10.8)

## 2015-05-13 NOTE — Telephone Encounter (Signed)
Pt advised.   Thanks,   -Laura  

## 2015-05-13 NOTE — Telephone Encounter (Signed)
LMTCB 05/13/2015  Thanks,   -Laura  

## 2015-05-13 NOTE — Telephone Encounter (Signed)
-----   Message from [name], MD sent at 05/13/2015 10:25 AM EST ----- Platelets stable.

## 2015-07-14 ENCOUNTER — Telehealth: Payer: Self-pay | Admitting: Family Medicine

## 2015-07-14 ENCOUNTER — Ambulatory Visit (INDEPENDENT_AMBULATORY_CARE_PROVIDER_SITE_OTHER): Payer: Medicare Other | Admitting: Family Medicine

## 2015-07-14 VITALS — BP 170/72 | HR 72 | Temp 97.7°F | Resp 16 | Wt 131.0 lb

## 2015-07-14 DIAGNOSIS — G4700 Insomnia, unspecified: Secondary | ICD-10-CM

## 2015-07-14 DIAGNOSIS — I10 Essential (primary) hypertension: Secondary | ICD-10-CM

## 2015-07-14 MED ORDER — METOPROLOL SUCCINATE ER 50 MG PO TB24: 50.0000 mg | ORAL_TABLET | Freq: Every day | ORAL | Status: DC

## 2015-07-14 MED ORDER — TRAZODONE HCL 50 MG PO TABS: 25.0000 mg | ORAL_TABLET | Freq: Every evening | ORAL | Status: DC | PRN

## 2015-07-14 NOTE — Telephone Encounter (Signed)
Pt is returning cal to Lauren Lawrence.  CB#[phone]/MW

## 2015-07-14 NOTE — Progress Notes (Signed)
Patient ID: Lauren DurhamMitzy C Pracht, female   DOB: March 15, 1941, 75 y.o.   MRN: [id number]   Lauren DurhamMitzy C Pippen  MRN: [id number] DOB: March 15, 1941  Subjective:  HPI   1. Essential (primary) hypertension The patient is a 75 year old female who presents for follow up of her hypertension.  She was last seen on 05/12/15.  Management changes at that time were that we increased her Losartan from 50 mg to 100 mg.  The patient states she thinks she is taking a different medication.  She states she takes one in the morning and one at night.  I have checked with the pharmacy and they have only filled the Losartan 100 mg since her last visit.  However she did get her Losartan 50 mg filled in December for a 90 day supply.  The patient is also on Metoprolol 25 mg daily and HCTZ 25 mg daily as needed.  The patient did not bring her bottles today but is going to check everything she has at home and compare it to our list.   Patient Active Problem List   Diagnosis Date Noted  . Allergic rhinitis 10/17/2014  . Absolute anemia 10/17/2014  . Cyst of thyroid 10/17/2014  . Clinical depression 10/17/2014  . Essential (primary) hypertension 10/17/2014  . Fatty infiltration of liver 10/17/2014  . Combined fat and carbohydrate induced hyperlipemia 10/17/2014  . Acquired hypothyroidism 10/17/2014  . Decreased leukocytes 10/17/2014  . OP (osteoporosis) 10/17/2014  . Enlargement of spleen 10/17/2014  . Phlebectasia 10/17/2014  . Avitaminosis D 10/17/2014  . HLD (hyperlipidemia) 07/25/2014    No past medical history on file.  Social History   Social History  . Marital Status: Married    Spouse Name: Alfredo BachCecil  . Number of Children: 2  . Years of Education: 16   Occupational History  . retired    Social History Main Topics  . Smoking status: Never Smoker   . Smokeless tobacco: Never Used  . Alcohol Use: No  . Drug Use: No  . Sexual Activity: No   Other Topics Concern  . Not on file   Social History Narrative     Outpatient Prescriptions Prior to Visit  Medication Sig Dispense Refill  . Ascorbic Acid 500 MG CAPS Take by mouth.    . Cholecalciferol 1000 UNITS TBDP Take by mouth.    . fluticasone (FLONASE) 50 MCG/ACT nasal spray USE 1-2 SQUIRTS EACH NOSTRIL DAILY 16 g 12  . hydrochlorothiazide (HYDRODIURIL) 25 MG tablet TAKE 1 TABLET BY MOUTH EVERY DAY AS NEEDED 30 tablet 5  . levothyroxine (SYNTHROID, LEVOTHROID) 88 MCG tablet Take 1 tablet (88 mcg total) by mouth daily. 90 tablet 2  . losartan (COZAAR) 100 MG tablet Take 1 tablet (100 mg total) by mouth daily. 30 tablet 12  . Magnesium 400 MG CAPS Take by mouth.    . metoprolol succinate (TOPROL-XL) 25 MG 24 hr tablet Take 1 tablet (25 mg total) by mouth daily. 30 tablet 12  . Milk Thistle-Dand-Fennel-Licor (MILK THISTLE XTRA) CAPS Take by mouth.    . raloxifene (EVISTA) 60 MG tablet Take 60 mg by mouth daily.  3  . vitamin E 400 UNIT capsule Take by mouth.     No facility-administered medications prior to visit.    No Known Allergies  Review of Systems  Constitutional: Positive for malaise/fatigue (Patient states she does not get enough sleep due to caring for her husband iwth dementia). Negative for fever.  Respiratory: Negative for cough, shortness of  breath and wheezing.   Cardiovascular: Negative for chest pain, palpitations, orthopnea and leg swelling.  Neurological: Negative for dizziness, weakness and headaches.   Objective:  BP 166/70 mmHg  Pulse 72  Temp(Src) 97.7 F (36.5 C) (Oral)  Resp 16  Wt 131 lb (59.421 kg)  Physical Exam  Constitutional: She is oriented to person, place, and time and well-developed, well-nourished, and in no distress.  HENT:  Head: Normocephalic and atraumatic.  Right Ear: External ear normal.  Left Ear: External ear normal.  Nose: Nose normal.  Cardiovascular: Normal rate, regular rhythm and normal heart sounds.   Pulmonary/Chest: Effort normal and breath sounds normal.  Abdominal: Soft.   Musculoskeletal: She exhibits edema (Trace).  Neurological: She is alert and oriented to person, place, and time. Gait normal.  Skin: Skin is warm and dry.  Fair skin.  Psychiatric: Mood, memory and judgment normal.  Affect slightly flattened    Assessment and Plan :   1. Essential (primary) hypertension Try to get systolic down a little. - metoprolol succinate (TOPROL-XL) 50 MG 24 hr tablet; Take 1 tablet (50 mg total) by mouth daily. Take with or immediately following a meal.  Dispense: 30 tablet; Refill: 12 Return to clinic 1-2 months. 2. Insomnia - traZODone (DESYREL) 50 MG tablet; Take 0.5-1 tablets (25-50 mg total) by mouth at bedtime as needed for sleep.  Dispense: 30 tablet; Refill: 3 Patient is very stressed as her husband has progressive dementia. I have done the exam and reviewed the above chart and it is accurate to the best of my knowledge.  Julieanne Manson MD [facility] 07/14/2015 10:53 AM

## 2015-07-14 NOTE — Telephone Encounter (Signed)
Pt is returning a call to [place].  CB#[phone]/MW

## 2015-07-15 NOTE — Telephone Encounter (Signed)
Lauren Lawrence spoke to patient already-aa

## 2015-07-31 ENCOUNTER — Other Ambulatory Visit: Payer: Self-pay | Admitting: Gastroenterology

## 2015-07-31 DIAGNOSIS — K760 Fatty (change of) liver, not elsewhere classified: Secondary | ICD-10-CM

## 2015-08-06 ENCOUNTER — Ambulatory Visit: Payer: Medicare Other

## 2015-08-08 ENCOUNTER — Ambulatory Visit
Admission: RE | Admit: 2015-08-08 | Discharge: 2015-08-08 | Disposition: A | Discharge status: Home / Self Care | Payer: Medicare Other | Source: Ambulatory Visit | Attending: Gastroenterology | Admitting: Gastroenterology

## 2015-08-08 ENCOUNTER — Ambulatory Visit: Payer: Medicare Other

## 2015-08-08 DIAGNOSIS — K760 Fatty (change of) liver, not elsewhere classified: Secondary | ICD-10-CM | POA: Diagnosis present

## 2015-08-08 DIAGNOSIS — R161 Splenomegaly, not elsewhere classified: Secondary | ICD-10-CM | POA: Diagnosis not present

## 2015-08-08 DIAGNOSIS — R932 Abnormal findings on diagnostic imaging of liver and biliary tract: Secondary | ICD-10-CM | POA: Insufficient documentation

## 2015-09-16 ENCOUNTER — Ambulatory Visit (INDEPENDENT_AMBULATORY_CARE_PROVIDER_SITE_OTHER): Payer: Medicare Other | Admitting: Family Medicine

## 2015-09-16 VITALS — BP 162/58 | HR 56 | Temp 98.2°F | Resp 14 | Wt 130.0 lb

## 2015-09-16 DIAGNOSIS — F32A Depression, unspecified: Secondary | ICD-10-CM

## 2015-09-16 DIAGNOSIS — I10 Essential (primary) hypertension: Secondary | ICD-10-CM

## 2015-09-16 DIAGNOSIS — F329 Major depressive disorder, single episode, unspecified: Secondary | ICD-10-CM | POA: Diagnosis not present

## 2015-09-16 DIAGNOSIS — D649 Anemia, unspecified: Secondary | ICD-10-CM | POA: Diagnosis not present

## 2015-09-16 DIAGNOSIS — G4700 Insomnia, unspecified: Secondary | ICD-10-CM

## 2015-09-16 NOTE — Progress Notes (Signed)
Patient ID: Lauren Lawrence, female   DOB: [birth year], 75 y.o.   MRN: [id number]    Subjective:  HPI  Patient is here for 2 months follow up. Last office visit in March for hypertension Metoprolol was increased to 50 mg and for insomnia Trazodone was started. Hypertension: patient has been checking her b/p and readings vary in the morning readings are 134/61, 107/57, 143/63 and in the evenings 124/60, 145/82. No cardiac symptoms. BP Readings from Last 3 Encounters:  09/16/15 162/58  07/14/15 170/72  05/12/15 164/72   Insomnia: patient has taking Trazodone maybe 1 to 2 tablets a week if that, sleeping issue has not really changed and feels like if she takes this medication regularly at least at the beginning she will sleep better.   Prior to Admission medications   Medication Sig Start Date End Date Taking? Authorizing Provider  Ascorbic Acid 500 MG CAPS Take by mouth. 11/13/12   Historical Provider, MD  Cholecalciferol 1000 UNITS TBDP Take by mouth.    Historical Provider, MD  fluticasone [facility]) 50 MCG/ACT nasal spray USE 1-2 SQUIRTS EACH NOSTRIL DAILY 02/27/15   [name], MD  hydrochlorothiazide (HYDRODIURIL) 25 MG tablet TAKE 1 TABLET BY MOUTH EVERY DAY AS NEEDED 12/24/14   [name], MD  levothyroxine (SYNTHROID, LEVOTHROID) 88 MCG tablet Take 1 tablet (88 mcg total) by mouth daily. 12/26/14   Richard Hulen Shouts., MD  losartan (COZAAR) 100 MG tablet Take 1 tablet (100 mg total) by mouth daily. 05/12/15   Richard Hulen Shouts., MD  Magnesium 400 MG CAPS Take by mouth. 11/13/12   Historical Provider, MD  metoprolol succinate (TOPROL-XL) 50 MG 24 hr tablet Take 1 tablet (50 mg total) by mouth daily. Take with or immediately following a meal. 07/14/15   [name], MD  Milk Thistle-Dand-Fennel-Licor (MILK THISTLE XTRA) CAPS Take by mouth. 07/07/10   Historical Provider, MD  raloxifene (EVISTA) 60 MG tablet Take 60 mg by mouth daily. 02/16/15   Historical Provider,  MD  traZODone (DESYREL) 50 MG tablet Take 0.5-1 tablets (25-50 mg total) by mouth at bedtime as needed for sleep. 07/14/15   [name], MD  vitamin E 400 UNIT capsule Take by mouth. 07/07/10   Historical Provider, MD    Patient Active Problem List   Diagnosis Date Noted  . Allergic rhinitis 10/17/2014  . Absolute anemia 10/17/2014  . Cyst of thyroid 10/17/2014  . Clinical depression 10/17/2014  . Essential (primary) hypertension 10/17/2014  . Fatty infiltration of liver 10/17/2014  . Combined fat and carbohydrate induced hyperlipemia 10/17/2014  . Acquired hypothyroidism 10/17/2014  . Decreased leukocytes 10/17/2014  . OP (osteoporosis) 10/17/2014  . Enlargement of spleen 10/17/2014  . Phlebectasia 10/17/2014  . Avitaminosis D 10/17/2014  . HLD (hyperlipidemia) 07/25/2014    No past medical history on file.  Social History   Social History  . Marital Status: Married    Spouse Name: Alfredo Bach  . Number of Children: 2  . Years of Education: 16   Occupational History  . retired    Social History Main Topics  . Smoking status: Never Smoker   . Smokeless tobacco: Never Used  . Alcohol Use: No  . Drug Use: No  . Sexual Activity: No   Other Topics Concern  . Not on file   Social History Narrative    No Known Allergies  Review of Systems  Constitutional: Negative.   Respiratory: Negative.   Cardiovascular:  Negative.   Musculoskeletal: Negative.   Psychiatric/Behavioral: Positive for depression. The patient has insomnia.     Immunization History  Administered Date(s) Administered  . Hepatitis B 03/04/2011, 04/08/2011  . Influenza, High Dose Seasonal PF 02/04/2015  . Pneumococcal Conjugate-13 05/21/2014  . Pneumococcal Polysaccharide-23 06/29/2011  . Tdap 01/03/2007   Objective:  BP 162/58 mmHg  Pulse 56  Temp(Src) 98.2 F (36.8 C)  Resp 14  Wt 130 lb (58.968 kg)  Physical Exam  Constitutional: She is oriented to person, place, and time and  well-developed, well-nourished, and in no distress.  HENT:  Head: Normocephalic and atraumatic.  Right Ear: External ear normal.  Left Ear: External ear normal.  Eyes: Conjunctivae are normal. Pupils are equal, round, and reactive to light.  Neck: Normal range of motion. Neck supple.  Cardiovascular: Normal rate, regular rhythm, normal heart sounds and intact distal pulses.   No murmur heard. Pulmonary/Chest: Effort normal and breath sounds normal. No respiratory distress. She has no wheezes.  Musculoskeletal: She exhibits no edema or tenderness.  Neurological: She is alert and oriented to person, place, and time. Gait normal.  Psychiatric: Mood, memory, affect and judgment normal.    Lab Results  Component Value Date   WBC 4.2 05/12/2015   HGB 14.7 05/21/2014   HCT 39.8 05/12/2015   PLT 100* 05/12/2015   GLUCOSE 95 02/05/2015   CHOL 142 02/05/2015   TRIG 56 02/05/2015   HDL 60 02/05/2015   LDLCALC 71 02/05/2015   TSH 0.899 02/05/2015   INR 1.1 06/19/2013    CMP     Component Value Date/Time   NA 141 02/05/2015 0908   NA 137 07/22/2011 0952   K 5.3* 02/05/2015 0908   K 3.9 07/22/2011 0952   CL 103 02/05/2015 0908   CL 98 07/22/2011 0952   CO2 28 02/05/2015 0908   CO2 31 07/22/2011 0952   GLUCOSE 95 02/05/2015 0908   GLUCOSE 76 07/22/2011 0952   BUN 10 02/05/2015 0908   BUN 9 07/22/2011 0952   CREATININE 0.54* 02/05/2015 0908   CREATININE 0.5 02/21/2014   CREATININE 0.61 07/22/2011 0952   CALCIUM 9.5 02/05/2015 0908   CALCIUM 9.2 07/22/2011 0952   PROT 6.4 02/05/2015 0908   PROT 7.4 04/06/2012 0941   ALBUMIN 4.3 02/05/2015 0908   ALBUMIN 4.0 04/06/2012 0941   AST 35 02/05/2015 0908   AST 45* 04/06/2012 0941   ALT 32 02/05/2015 0908   ALT 51 04/06/2012 0941   ALKPHOS 47 02/05/2015 0908   ALKPHOS 63 04/06/2012 0941   BILITOT 0.9 02/05/2015 0908   BILITOT 0.6 04/06/2012 0941   GFRNONAA 93 02/05/2015 0908   GFRNONAA >60 07/22/2011 0952   GFRAA 108 02/05/2015  0908   GFRAA >60 07/22/2011 0952    Assessment and Plan :  1. Essential (primary) hypertension Still elevated in the office but patient readings at home are better. Will not make any medication adjustments for this right now and just follow. Patient does not want to make medication changes right now.  2. Insomnia Advised patient she can take Trazodone every night. Will follow.  3. Clinical depression Stable per patient. Patient is dealing with husband having dementia and adjusting to he situation. PHQ9 score today is 3.  4. Anemia, unspecified anemia type Stable on February  Patient was seen and examined by Dr. Bosie Closichard L Gilbert and note was scribed by Samara DeistAnastasiya Aleksandrova, RMA.    Julieanne Mansonichard Gilbert MD [facility] 09/16/2015  10:14 AM

## 2015-09-27 ENCOUNTER — Other Ambulatory Visit: Payer: Self-pay | Admitting: Family Medicine

## 2015-12-09 ENCOUNTER — Other Ambulatory Visit: Payer: Self-pay

## 2015-12-09 DIAGNOSIS — G4700 Insomnia, unspecified: Secondary | ICD-10-CM

## 2015-12-09 MED ORDER — TRAZODONE HCL 50 MG PO TABS: 25.0000 mg | ORAL_TABLET | Freq: Every evening | ORAL | 5 refills | Status: DC | PRN

## 2015-12-09 NOTE — Telephone Encounter (Signed)
Refill request for Trazodone for 90 day supply from [facility], please review-aa

## 2015-12-15 ENCOUNTER — Other Ambulatory Visit: Payer: Self-pay

## 2015-12-15 DIAGNOSIS — G4700 Insomnia, unspecified: Secondary | ICD-10-CM

## 2015-12-15 DIAGNOSIS — I10 Essential (primary) hypertension: Secondary | ICD-10-CM

## 2015-12-15 MED ORDER — TRAZODONE HCL 50 MG PO TABS: 25.0000 mg | ORAL_TABLET | Freq: Every evening | ORAL | 1 refills | Status: DC | PRN

## 2015-12-15 MED ORDER — METOPROLOL SUCCINATE ER 50 MG PO TB24: 50.0000 mg | ORAL_TABLET | Freq: Every day | ORAL | 3 refills | Status: DC

## 2015-12-26 ENCOUNTER — Other Ambulatory Visit: Payer: Self-pay | Admitting: Family Medicine

## 2016-01-07 ENCOUNTER — Encounter: Payer: Self-pay | Admitting: Family Medicine

## 2016-01-07 ENCOUNTER — Ambulatory Visit (INDEPENDENT_AMBULATORY_CARE_PROVIDER_SITE_OTHER): Payer: Medicare Other | Admitting: Family Medicine

## 2016-01-07 VITALS — BP 142/78 | HR 64 | Temp 98.5°F | Resp 16 | Ht 61.0 in | Wt 128.0 lb

## 2016-01-07 DIAGNOSIS — Z0000 Encounter for general adult medical examination without abnormal findings: Secondary | ICD-10-CM | POA: Diagnosis not present

## 2016-01-07 DIAGNOSIS — M810 Age-related osteoporosis without current pathological fracture: Secondary | ICD-10-CM

## 2016-01-07 DIAGNOSIS — E785 Hyperlipidemia, unspecified: Secondary | ICD-10-CM | POA: Diagnosis not present

## 2016-01-07 DIAGNOSIS — I10 Essential (primary) hypertension: Secondary | ICD-10-CM | POA: Diagnosis not present

## 2016-01-07 DIAGNOSIS — E039 Hypothyroidism, unspecified: Secondary | ICD-10-CM

## 2016-01-07 DIAGNOSIS — Z1231 Encounter for screening mammogram for malignant neoplasm of breast: Secondary | ICD-10-CM

## 2016-01-07 DIAGNOSIS — Z23 Encounter for immunization: Secondary | ICD-10-CM

## 2016-01-07 DIAGNOSIS — E559 Vitamin D deficiency, unspecified: Secondary | ICD-10-CM | POA: Diagnosis not present

## 2016-01-07 NOTE — Progress Notes (Signed)
Patient: Lauren Lawrence, Female    DOB: 17-Oct-1940, 75 y.o.   MRN: [id number] Visit Date: 01/07/2016  Today's Provider: [name], MD   Chief Complaint  Patient presents with  . Medicare Wellness   Subjective:    Annual wellness visit Lauren Lawrence is a 75 y.o. female. She feels well. She reports exercising.  She does not have a formal exercise routine but stays very active.  She reports she is sleeping fairly well.  Sleeping as improved but she has to get up a lot with her husband.  Her husband is in failing health and specifically dementia is a major issue for him. ----------------------------------------------------------- Hyperlipidemia  This is a chronic problem. The problem is controlled. Recent lipid tests were reviewed and are normal. Pertinent negatives include no chest pain, focal sensory loss, focal weakness, leg pain, myalgias or shortness of breath. Current antihyperlipidemic treatment includes exercise and diet change. There are no compliance problems.  Risk factors for coronary artery disease include dyslipidemia and hypertension.  Hypertension  This is a chronic problem. The problem is controlled. Pertinent negatives include no chest pain, malaise/fatigue, palpitations or shortness of breath. Risk factors for coronary artery disease include dyslipidemia. There are no compliance problems.  Hypertensive end-organ damage includes a thyroid problem.  Thyroid Problem  Presents for follow-up visit. Patient reports no anxiety, cold intolerance, depressed mood, diarrhea, fatigue, hair loss, heat intolerance, hoarse voice, nail problem, palpitations, tremors, weight gain or weight loss. The symptoms have been stable. Her past medical history is significant for hyperlipidemia.    Lab Results  Component Value Date   CHOL 142 02/05/2015   CHOL 122 02/21/2014   Lab Results  Component Value Date   HDL 60 02/05/2015   HDL 59 02/21/2014   Lab Results  Component  Value Date   LDLCALC 71 02/05/2015   LDLCALC 52 02/21/2014   Lab Results  Component Value Date   TRIG 56 02/05/2015   TRIG 53 02/21/2014    BP Readings from Last 3 Encounters:  01/07/16 (!) 142/78  09/16/15 (!) 162/58  07/14/15 (!) 170/72      Review of Systems  Constitutional: Negative.  Negative for fatigue, malaise/fatigue, weight gain and weight loss.  HENT: Negative.  Negative for hoarse voice.   Eyes: Negative.   Respiratory: Negative.  Negative for shortness of breath.   Cardiovascular: Negative.  Negative for chest pain and palpitations.  Gastrointestinal: Negative.  Negative for diarrhea.  Endocrine: Negative.  Negative for cold intolerance and heat intolerance.  Genitourinary: Negative.   Musculoskeletal: Negative.  Negative for myalgias.  Skin: Negative.   Allergic/Immunologic: Negative.   Neurological: Negative.  Negative for tremors and focal weakness.  Hematological: Bruises/bleeds easily.  Psychiatric/Behavioral: Negative.  The patient is not nervous/anxious.     Social History   Social History  . Marital status: Married    Spouse name: Alfredo Bach  . Number of children: 2  . Years of education: 16   Occupational History  . retired    Social History Main Topics  . Smoking status: Never Smoker  . Smokeless tobacco: Never Used  . Alcohol use No  . Drug use: No  . Sexual activity: No   Other Topics Concern  . Not on file   Social History Narrative  . No narrative on file    No past medical history on file.   Patient Active Problem List   Diagnosis Date Noted  . Allergic  rhinitis 10/17/2014  . Absolute anemia 10/17/2014  . Cyst of thyroid 10/17/2014  . Clinical depression 10/17/2014  . Essential (primary) hypertension 10/17/2014  . Fatty infiltration of liver 10/17/2014  . Combined fat and carbohydrate induced hyperlipemia 10/17/2014  . Acquired hypothyroidism 10/17/2014  . Decreased leukocytes 10/17/2014  . OP (osteoporosis) 10/17/2014    . Enlargement of spleen 10/17/2014  . Phlebectasia 10/17/2014  . Avitaminosis D 10/17/2014  . HLD (hyperlipidemia) 07/25/2014    No past surgical history on file.  Her family history includes Cancer in her mother; Dementia in her father; Gout in her father; Heart disease in her brother, father, and mother; Hypertension in her father.    Current Meds  Medication Sig  . Ascorbic Acid 500 MG CAPS Take by mouth.  . Cholecalciferol 1000 UNITS TBDP Take by mouth.  . fluticasone (FLONASE) 50 MCG/ACT nasal spray USE 1-2 SQUIRTS EACH NOSTRIL DAILY  . hydrochlorothiazide (HYDRODIURIL) 25 MG tablet TAKE 1 TABLET BY MOUTH EVERY DAY AS NEEDED  . levothyroxine (SYNTHROID, LEVOTHROID) 88 MCG tablet TAKE 1 TABLET BY MOUTH ONCE A DAY  . losartan (COZAAR) 100 MG tablet Take 1 tablet (100 mg total) by mouth daily.  . Magnesium 400 MG CAPS Take by mouth.  . metoprolol succinate (TOPROL-XL) 50 MG 24 hr tablet Take 1 tablet (50 mg total) by mouth daily. Take with or immediately following a meal.  . Milk Thistle-Dand-Fennel-Licor (MILK THISTLE XTRA) CAPS Take by mouth.  . raloxifene (EVISTA) 60 MG tablet Take 60 mg by mouth daily.  . traZODone (DESYREL) 50 MG tablet Take 0.5-1 tablets (25-50 mg total) by mouth at bedtime as needed for sleep.  . vitamin E 400 UNIT capsule Take by mouth.    Patient Care Team: Maple Hudsonichard L Gilbert Jr., MD as PCP - General (Family Medicine)    Objective:   Vitals: BP (!) 142/78 (BP Location: Right Arm, Patient Position: Sitting, Cuff Size: Normal)   Pulse 64   Temp 98.5 F (36.9 C) (Oral)   Resp 16   Ht 5\' 1"  (1.549 m)   Wt 128 lb (58.1 kg)   BMI 24.19 kg/m   Physical Exam  Constitutional: She is oriented to person, place, and time. She appears well-developed and well-nourished.  HENT:  Head: Normocephalic and atraumatic.  Right Ear: External ear normal.  Left Ear: External ear normal.  Nose: Nose normal.  Mouth/Throat: Oropharynx is clear and moist.  Eyes:  Conjunctivae and EOM are normal. Pupils are equal, round, and reactive to light.  Neck: Normal range of motion. Neck supple.  Cardiovascular: Normal rate, regular rhythm and normal heart sounds.   Pulmonary/Chest: Effort normal and breath sounds normal. Right breast exhibits no inverted nipple, no mass, no nipple discharge, no skin change and no tenderness. Left breast exhibits no inverted nipple, no mass, no nipple discharge, no skin change and no tenderness. Breasts are symmetrical.  Abdominal: Soft. Bowel sounds are normal.  Musculoskeletal: Normal range of motion.  Neurological: She is alert and oriented to person, place, and time. She has normal reflexes.  Skin: Skin is warm and dry.  Psychiatric: She has a normal mood and affect. Her behavior is normal. Judgment and thought content normal.    Activities of Daily Living In your present state of health, do you have any difficulty performing the following activities: 01/07/2016 09/16/2015  Hearing? N N  Vision? N N  Difficulty concentrating or making decisions? N N  Walking or climbing stairs? N N  Dressing or bathing?  N N  Doing errands, shopping? N N  Some recent data might be hidden    Fall Risk Assessment Fall Risk  01/07/2016 09/16/2015 02/04/2015 12/05/2014  Falls in the past year? No No No No     Depression Screen PHQ 2/9 Scores 01/07/2016 09/16/2015 02/04/2015 02/04/2015  PHQ - 2 Score 0 1 1 0  PHQ- 9 Score - 3 4 -    Cognitive Testing - 6-CIT  Correct? Score   What year is it? yes 0 0 or 4  What month is it? yes 0 0 or 3  Memorize:    Floyde Parkins,  42,  [address]  [place],      What time is it? (within 1 hour) yes 0 0 or 3  Count backwards from 20 yes 0 0, 2, or 4  Name the months of the year no 2 0, 2, or 4  Repeat name & address above yes 0 0, 2, 4, 6, 8, or 10       TOTAL SCORE  2/28   Interpretation:  Normal  Normal (0-7) Abnormal (8-28)       Assessment & Plan:     Annual Wellness Visit              DRE next year--2018. Reviewed patient's Family Medical History Reviewed and updated list of patient's medical providers Assessment of cognitive impairment was done Assessed patient's functional ability Established a written schedule for health screening services Health Risk Assessent Completed and Reviewed          Exercise Activities and Dietary recommendations Goals    None      Immunization History  Administered Date(s) Administered  . Hepatitis B 03/04/2011, 04/08/2011  . Influenza, High Dose Seasonal PF 02/04/2015  . Pneumococcal Conjugate-13 05/21/2014  . Pneumococcal Polysaccharide-23 06/29/2011  . Tdap 01/03/2007    Health Maintenance  Topic Date Due  . ZOSTAVAX  12/17/2000  . INFLUENZA VACCINE  11/18/2015  . TETANUS/TDAP  01/02/2017  . COLONOSCOPY  06/20/2023  . DEXA SCAN  Completed  . PNA vac Low Risk Adult  Completed      Discussed health benefits of physical activity, and encouraged her to engage in regular exercise appropriate for her age and condition.    ------------------------------------------------------------------------------------------------------------ 2. Flu vaccine need Giving Flu vaccine in the office today.   - Flu vaccine HIGH DOSE PF (Fluzone High dose)  3. Essential (primary) hypertension Stable continue current medications.   - CBC with Differential/Platelet  4. Acquired hypothyroidism Tolerating current dose of Levothyroxine; will recheck labs today.   - TSH  5. Avitaminosis D Pt reports not taking Vitamin D everyday; Will recheck today.   - VITAMIN D 25 Hydroxy (Vit-D Deficiency, Fractures)  6. HLD (hyperlipidemia) Stable will recheck labs today.   - Lipid panel - Comprehensive metabolic panel  7. OP (osteoporosis) Due for another Bone Density 11/2016.  Patient was seen and examined by Julieanne Manson, MD, and note scribed by Kavin Leech, CMA.  I have done the exam and reviewed the above chart and it is accurate to  the best of my knowledge.     Richard Wendelyn Breslow, MD  [facility]

## 2016-01-08 LAB — CBC WITH DIFFERENTIAL/PLATELET
BASOS ABS: 0 10*3/uL (ref 0.0–0.2)
BASOS: 1 %
EOS (ABSOLUTE): 0.1 10*3/uL (ref 0.0–0.4)
Eos: 3 %
HEMATOCRIT: 39.3 % (ref 34.0–46.6)
Hemoglobin: 13.3 g/dL (ref 11.1–15.9)
IMMATURE GRANS (ABS): 0 10*3/uL (ref 0.0–0.1)
Immature Granulocytes: 0 %
LYMPHS ABS: 0.9 10*3/uL (ref 0.7–3.1)
Lymphs: 27 %
MCH: 30.2 pg (ref 26.6–33.0)
MCHC: 33.8 g/dL (ref 31.5–35.7)
MCV: 89 fL (ref 79–97)
MONOCYTES: 13 %
Monocytes Absolute: 0.4 10*3/uL (ref 0.1–0.9)
NEUTROS ABS: 1.7 10*3/uL (ref 1.4–7.0)
Neutrophils: 56 %
PLATELETS: 88 10*3/uL — AB (ref 150–379)
RBC: 4.4 x10E6/uL (ref 3.77–5.28)
RDW: 14.1 % (ref 12.3–15.4)
WBC: 3.1 10*3/uL — ABNORMAL LOW (ref 3.4–10.8)

## 2016-01-08 LAB — COMPREHENSIVE METABOLIC PANEL
A/G RATIO: 1.5 (ref 1.2–2.2)
ALK PHOS: 46 IU/L (ref 39–117)
ALT: 31 IU/L (ref 0–32)
AST: 40 IU/L (ref 0–40)
Albumin: 4 g/dL (ref 3.5–4.8)
BILIRUBIN TOTAL: 0.5 mg/dL (ref 0.0–1.2)
BUN/Creatinine Ratio: 12 (ref 12–28)
BUN: 7 mg/dL — AB (ref 8–27)
CHLORIDE: 101 mmol/L (ref 96–106)
CO2: 28 mmol/L (ref 18–29)
Calcium: 9.3 mg/dL (ref 8.7–10.3)
Creatinine, Ser: 0.57 mg/dL (ref 0.57–1.00)
GFR calc Af Amer: 105 mL/min/{1.73_m2} (ref >59)
GFR calc non Af Amer: 91 mL/min/{1.73_m2} (ref >59)
GLUCOSE: 85 mg/dL (ref 65–99)
Globulin, Total: 2.6 g/dL (ref 1.5–4.5)
POTASSIUM: 4.6 mmol/L (ref 3.5–5.2)
Sodium: 140 mmol/L (ref 134–144)
TOTAL PROTEIN: 6.6 g/dL (ref 6.0–8.5)

## 2016-01-08 LAB — VITAMIN D 25 HYDROXY (VIT D DEFICIENCY, FRACTURES): VIT D 25 HYDROXY: 59.1 ng/mL (ref 30.0–100.0)

## 2016-01-08 LAB — LIPID PANEL
CHOL/HDL RATIO: 2.5 ratio (ref 0.0–4.4)
Cholesterol, Total: 141 mg/dL (ref 100–199)
HDL: 56 mg/dL (ref >39)
LDL Calculated: 71 mg/dL (ref 0–99)
Triglycerides: 69 mg/dL (ref 0–149)
VLDL CHOLESTEROL CAL: 14 mg/dL (ref 5–40)

## 2016-01-08 LAB — TSH: TSH: 1.34 u[IU]/mL (ref 0.450–4.500)

## 2016-01-09 ENCOUNTER — Telehealth: Payer: Self-pay

## 2016-01-09 NOTE — Telephone Encounter (Signed)
Tried calling patient and number was busy. Will try again later.  

## 2016-01-09 NOTE — Telephone Encounter (Signed)
-----   Message from Maple Hudsonichard L Gilbert Jr., MD sent at 01/09/2016 10:24 AM EDT ----- Cliffton AstersWhite count and platelets stable for now

## 2016-01-13 NOTE — Telephone Encounter (Signed)
LMTCB ED 

## 2016-01-13 NOTE — Telephone Encounter (Signed)
Has she been contacted [address]

## 2016-01-13 NOTE — Telephone Encounter (Signed)
Advised  ED 

## 2016-01-13 NOTE — Telephone Encounter (Signed)
Send to nurse box 

## 2016-02-12 ENCOUNTER — Ambulatory Visit: Payer: Medicare Other

## 2016-02-17 ENCOUNTER — Ambulatory Visit (INDEPENDENT_AMBULATORY_CARE_PROVIDER_SITE_OTHER): Payer: Medicare Other | Admitting: Family Medicine

## 2016-02-17 VITALS — BP 140/86 | HR 68 | Temp 97.6°F | Ht 60.25 in | Wt 128.4 lb

## 2016-02-17 DIAGNOSIS — Z0000 Encounter for general adult medical examination without abnormal findings: Secondary | ICD-10-CM | POA: Diagnosis not present

## 2016-02-17 NOTE — Progress Notes (Addendum)
Subjective:   Lauren Lawrence is a 75 y.o. female who presents for Medicare Annual (Subsequent) preventive examination.  Review of Systems:  N/A Cardiac Risk Factors include: advanced age (>8655men, 1>65 women);dyslipidemia;hypertension     Objective:     Vitals: BP 140/86 (BP Location: Left Arm)   Pulse 68   Temp 97.6 F (36.4 C) (Oral)   Ht 5' 0.25" (1.53 m)   Wt 128 lb 6 oz (58.2 kg)   BMI 24.86 kg/m   Body mass index is 24.86 kg/m.   Tobacco History  Smoking Status  . Never Smoker  Smokeless Tobacco  . Never Used     Counseling given: Not Answered   History reviewed. No pertinent past medical history. History reviewed. No pertinent surgical history. Family History  Problem Relation Age of Onset  . Heart disease Mother   . Cancer Mother     breat  . Dementia Father   . Hypertension Father   . Heart disease Father   . Gout Father   . Heart disease Brother     MI with CABG X3   History  Sexual Activity  . Sexual activity: No    Outpatient Encounter Prescriptions as of 02/17/2016  Medication Sig  . Ascorbic Acid 500 MG CAPS Take by mouth.  . Cholecalciferol 1000 UNITS TBDP Take by mouth.  . fluticasone (FLONASE) 50 MCG/ACT nasal spray USE 1-2 SQUIRTS EACH NOSTRIL DAILY  . hydrochlorothiazide (HYDRODIURIL) 25 MG tablet TAKE 1 TABLET BY MOUTH EVERY DAY AS NEEDED  . levothyroxine (SYNTHROID, LEVOTHROID) 88 MCG tablet TAKE 1 TABLET BY MOUTH ONCE A DAY  . losartan (COZAAR) 100 MG tablet Take 1 tablet (100 mg total) by mouth daily.  . Magnesium 400 MG CAPS Take by mouth.  . metoprolol succinate (TOPROL-XL) 50 MG 24 hr tablet Take 1 tablet (50 mg total) by mouth daily. Take with or immediately following a meal.  . Milk Thistle-Dand-Fennel-Licor (MILK THISTLE XTRA) CAPS Take by mouth.   . raloxifene (EVISTA) 60 MG tablet Take 60 mg by mouth daily.   . traZODone (DESYREL) 50 MG tablet Take 0.5-1 tablets (25-50 mg total) by mouth at bedtime as needed for sleep.  .  vitamin E 400 UNIT capsule Take by mouth.   No facility-administered encounter medications on file as of 02/17/2016.     Activities of Daily Living In your present state of health, do you have any difficulty performing the following activities: 02/17/2016 01/07/2016  Hearing? N N  Vision? N N  Difficulty concentrating or making decisions? N N  Walking or climbing stairs? N N  Dressing or bathing? N N  Doing errands, shopping? N N  Preparing Food and eating ? N -  Using the Toilet? N -  In the past six months, have you accidently leaked urine? N -  Do you have problems with loss of bowel control? N -  Managing your Medications? N -  Managing your Finances? N -  Housekeeping or managing your Housekeeping? N -  Some recent data might be hidden    Patient Care Team: Maple Hudsonichard L Gilbert Jr., MD as PCP - General (Family Medicine) Vedia PereyraPhillip T. Alvester MorinBell, MD as Consulting Physician (Ophthalmology) Christena DeemMartin U Skulskie, MD as Consulting Physician (Gastroenterology)    Assessment:     Exercise Activities and Dietary recommendations Current Exercise Habits: Home exercise routine, Type of exercise: walking, Time (Minutes): 20, Frequency (Times/Week): 3, Weekly Exercise (Minutes/Week): 60, Intensity: Mild  Goals    . Increase water  intake          Starting 02/17/16, I will increase my water intake to 4 glasses a day.      Fall Risk Fall Risk  02/17/2016 01/07/2016 09/16/2015 02/04/2015 12/05/2014  Falls in the past year? No No No No No   Depression Screen PHQ 2/9 Scores 02/17/2016 01/07/2016 09/16/2015 02/04/2015  PHQ - 2 Score 1 0 1 1  PHQ- 9 Score - - 3 4     Cognitive Function     6CIT Screen 02/17/2016 01/07/2016  What Year? 0 points 0 points  What month? 0 points 0 points  What time? 0 points 0 points  Count back from 20 0 points 0 points  Months in reverse 0 points 2 points  Repeat phrase 0 points 0 points  Total Score 0 2    Immunization History  Administered Date(s)  Administered  . Hepatitis B 03/04/2011, 04/08/2011  . Influenza, High Dose Seasonal PF 02/04/2015, 01/07/2016  . Pneumococcal Conjugate-13 05/21/2014  . Pneumococcal Polysaccharide-23 06/29/2011  . Tdap 01/03/2007   Screening Tests Health Maintenance  Topic Date Due  . ZOSTAVAX  02/16/2017 (Originally 12/17/2000)  . TETANUS/TDAP  01/02/2017  . COLONOSCOPY  06/20/2023  . INFLUENZA VACCINE  Completed  . DEXA SCAN  Completed  . PNA vac Low Risk Adult  Completed      Plan:    I have personally reviewed and addressed the Medicare Annual Wellness questionnaire and have noted the following in the patient's chart:  A. Medical and social history B. Use of alcohol, tobacco or illicit drugs  C. Current medications and supplements D. Functional ability and status E.  Nutritional status F.  Physical activity G. Advance directives H. List of other physicians I.  Hospitalizations, surgeries, and ER visits in previous 12 months J.  Vitals K. Screenings such as hearing and vision if needed, cognitive and depression L. Referrals and appointments - none  In addition, I have reviewed and discussed with patient certain preventive protocols, quality metrics, and best practice recommendations. A written personalized care plan for preventive services as well as general preventive health recommendations were provided to patient.  See attached scanned questionnaire for additional information.   Signed,  Hyacinth MeekerMckenzie Seanpaul Preece, LPN Nurse Health Advisor  I have reviewed the information as  put it by [facility] HospitalMackenzie Hayley Horn LPN and was available for any questions or concerns. Julieanne Mansonichard Gilbert MD [facility]

## 2016-02-17 NOTE — Patient Instructions (Signed)
[name] , Thank you for taking time to come for your Medicare Wellness Visit. I appreciate your ongoing commitment to your health goals. Please review the following plan we discussed and let me know if I can assist you in the future.   These are the goals we discussed: Goals    . Increase water intake          Starting 02/17/16, I will increase my water intake to 4 glasses a day.       This is a list of the screening recommended for you and due dates:  Health Maintenance  Topic Date Due  . Shingles Vaccine  02/16/2017*  . Tetanus Vaccine  01/02/2017  . Colon Cancer Screening  06/20/2023  . Flu Shot  Completed  . DEXA scan (bone density measurement)  Completed  . Pneumonia vaccines  Completed  *Topic was postponed. The date shown is not the original due date.   Preventive Care for Adults  A healthy lifestyle and preventive care can promote health and wellness. Preventive health guidelines for adults include the following key practices.  . A routine yearly physical is a good way to check with your health care provider about your health and preventive screening. It is a chance to share any concerns and updates on your health and to receive a thorough exam.  . Visit your dentist for a routine exam and preventive care every 6 months. Brush your teeth twice a day and floss once a day. Good oral hygiene prevents tooth decay and gum disease.  . The frequency of eye exams is based on your age, health, family medical history, use  of contact lenses, and other factors. Follow your health care provider's ecommendations for frequency of eye exams.  . Eat a healthy diet. Foods like vegetables, fruits, whole grains, low-fat dairy products, and lean protein foods contain the nutrients you need without too many calories. Decrease your intake of foods high in solid fats, added sugars, and salt. Eat the right amount of calories for you. Get information about a proper diet from your health care  provider, if necessary.  . Regular physical exercise is one of the most important things you can do for your health. Most adults should get at least 150 minutes of moderate-intensity exercise (any activity that increases your heart rate and causes you to sweat) each week. In addition, most adults need muscle-strengthening exercises on 2 or more days a week.  Silver Sneakers may be a benefit available to you. To determine eligibility, you may visit the website: www.silversneakers.com or contact program at (412)032-87581-(814)772-0882 Mon-Fri between 8AM-8PM.   . Maintain a healthy weight. The body mass index (BMI) is a screening tool to identify possible weight problems. It provides an estimate of body fat based on height and weight. Your health care provider can find your BMI and can help you achieve or maintain a healthy weight.   For adults 20 years and older: ? A BMI below 18.5 is considered underweight. ? A BMI of 18.5 to 24.9 is normal. ? A BMI of 25 to 29.9 is considered overweight. ? A BMI of 30 and above is considered obese.   . Maintain normal blood lipids and cholesterol levels by exercising and minimizing your intake of saturated fat. Eat a balanced diet with plenty of fruit and vegetables. Blood tests for lipids and cholesterol should begin at age 75 and be repeated every 5 years. If your lipid or cholesterol levels are high, you are over  50, or you are at high risk for heart disease, you may need your cholesterol levels checked more frequently. Ongoing high lipid and cholesterol levels should be treated with medicines if diet and exercise are not working.  . If you smoke, find out from your health care provider how to quit. If you do not use tobacco, please do not start.  . If you choose to drink alcohol, please do not consume more than 2 drinks per day. One drink is considered to be 12 ounces (355 mL) of beer, 5 ounces (148 mL) of wine, or 1.5 ounces (44 mL) of liquor.  . If you are 46-79 years  old, ask your health care provider if you should take aspirin to prevent strokes.  . Use sunscreen. Apply sunscreen liberally and repeatedly throughout the day. You should seek shade when your shadow is shorter than you. Protect yourself by wearing long sleeves, pants, a wide-brimmed hat, and sunglasses year round, whenever you are outdoors.  . Once a month, do a whole body skin exam, using a mirror to look at the skin on your back. Tell your health care provider of new moles, moles that have irregular borders, moles that are larger than a pencil eraser, or moles that have changed in shape or color.

## 2016-06-04 ENCOUNTER — Other Ambulatory Visit: Payer: Self-pay | Admitting: Family Medicine

## 2016-06-04 DIAGNOSIS — I10 Essential (primary) hypertension: Secondary | ICD-10-CM

## 2016-06-21 ENCOUNTER — Other Ambulatory Visit: Payer: Self-pay | Admitting: Family Medicine

## 2016-07-19 ENCOUNTER — Other Ambulatory Visit: Payer: Self-pay | Admitting: Family Medicine

## 2016-07-19 DIAGNOSIS — M810 Age-related osteoporosis without current pathological fracture: Secondary | ICD-10-CM

## 2017-01-19 ENCOUNTER — Other Ambulatory Visit: Payer: Self-pay | Admitting: Family Medicine

## 2017-01-19 DIAGNOSIS — I10 Essential (primary) hypertension: Secondary | ICD-10-CM

## 2017-04-24 ENCOUNTER — Other Ambulatory Visit: Payer: Self-pay | Admitting: Family Medicine

## 2017-04-24 DIAGNOSIS — I10 Essential (primary) hypertension: Secondary | ICD-10-CM

## 2017-05-16 ENCOUNTER — Other Ambulatory Visit: Payer: Self-pay | Admitting: Family Medicine

## 2017-05-23 ENCOUNTER — Other Ambulatory Visit: Payer: Self-pay | Admitting: Family Medicine

## 2017-05-23 DIAGNOSIS — I10 Essential (primary) hypertension: Secondary | ICD-10-CM

## 2017-05-23 NOTE — Telephone Encounter (Signed)
Patient has not been seen since 01/2016

## 2017-05-25 ENCOUNTER — Telehealth: Payer: Self-pay | Admitting: Family Medicine

## 2017-05-30 NOTE — Telephone Encounter (Signed)
Pt returned call. Pt is scheduled for AWV with [facility] on 06/08/17 and CPE/follow up with Dr. Sullivan LoneGilbert on 06/15/17. Thanks TNP

## 2017-06-03 ENCOUNTER — Other Ambulatory Visit: Payer: Self-pay | Admitting: Family Medicine

## 2017-06-03 DIAGNOSIS — I10 Essential (primary) hypertension: Secondary | ICD-10-CM

## 2017-06-08 ENCOUNTER — Encounter: Payer: Self-pay | Admitting: Family Medicine

## 2017-06-08 ENCOUNTER — Ambulatory Visit (INDEPENDENT_AMBULATORY_CARE_PROVIDER_SITE_OTHER): Payer: Medicare Other

## 2017-06-08 ENCOUNTER — Ambulatory Visit (INDEPENDENT_AMBULATORY_CARE_PROVIDER_SITE_OTHER): Payer: Medicare Other | Admitting: Family Medicine

## 2017-06-08 VITALS — BP 200/72 | HR 80 | Temp 98.0°F | Ht 60.0 in | Wt 120.0 lb

## 2017-06-08 VITALS — BP 202/72 | HR 80 | Temp 98.0°F | Wt 120.0 lb

## 2017-06-08 DIAGNOSIS — E782 Mixed hyperlipidemia: Secondary | ICD-10-CM | POA: Diagnosis not present

## 2017-06-08 DIAGNOSIS — Z0000 Encounter for general adult medical examination without abnormal findings: Secondary | ICD-10-CM

## 2017-06-08 DIAGNOSIS — I10 Essential (primary) hypertension: Secondary | ICD-10-CM

## 2017-06-08 DIAGNOSIS — E039 Hypothyroidism, unspecified: Secondary | ICD-10-CM

## 2017-06-08 DIAGNOSIS — Z23 Encounter for immunization: Secondary | ICD-10-CM | POA: Diagnosis not present

## 2017-06-08 MED ORDER — AMLODIPINE BESYLATE 5 MG PO TABS: 5.0000 mg | ORAL_TABLET | Freq: Every day | ORAL | 3 refills | Status: DC

## 2017-06-08 NOTE — Patient Instructions (Addendum)
Lauren Lawrence , Thank you for taking time to come for your Medicare Wellness Visit. I appreciate your ongoing commitment to your health goals. Please review the following plan we discussed and let me know if I can assist you in the future.   Screening recommendations/referrals: Colonoscopy: Up to date Mammogram: N/A Bone Density: Up to date Recommended yearly ophthalmology/optometry visit for glaucoma screening and checkup Recommended yearly dental visit for hygiene and checkup  Vaccinations: Influenza vaccine: Up to date Pneumococcal vaccine: Up to date Tdap vaccine: Pt declines today.  Shingles vaccine: Pt declines today.     Advanced directives: Advance directive discussed with you today. I have provided a copy for you to complete at home and have notarized. Once this is complete please bring a copy in to our office so we can scan it into your chart.  Conditions/risks identified: Recommend increasing water intake to 4-6 glasses a day.   Next appointment: 06/15/17 @ 10:00 AM with Dr Sullivan LoneGilbert.   Preventive Care 2265 Years and Older, Female Preventive care refers to lifestyle choices and visits with your health care provider that can promote health and wellness. What does preventive care include?  A yearly physical exam. This is also called an annual well check.  Dental exams once or twice a year.  Routine eye exams. Ask your health care provider how often you should have your eyes checked.  Personal lifestyle choices, including:  Daily care of your teeth and gums.  Regular physical activity.  Eating a healthy diet.  Avoiding tobacco and drug use.  Limiting alcohol use.  Practicing safe sex.  Taking low-dose aspirin every day.  Taking vitamin and mineral supplements as recommended by your health care provider. What happens during an annual well check? The services and screenings done by your health care provider during your annual well check will depend on your age, overall  health, lifestyle risk factors, and family history of disease. Counseling  Your health care provider may ask you questions about your:  Alcohol use.  Tobacco use.  Drug use.  Emotional well-being.  Home and relationship well-being.  Sexual activity.  Eating habits.  History of falls.  Memory and ability to understand (cognition).  Work and work Astronomerenvironment.  Reproductive health. Screening  You may have the following tests or measurements:  Height, weight, and BMI.  Blood pressure.  Lipid and cholesterol levels. These may be checked every 5 years, or more frequently if you are over 77 years old.  Skin check.  Lung cancer screening. You may have this screening every year starting at age 77 if you have a 30-pack-year history of smoking and currently smoke or have quit within the past 15 years.  Fecal occult blood test (FOBT) of the stool. You may have this test every year starting at age 77.  Flexible sigmoidoscopy or colonoscopy. You may have a sigmoidoscopy every 5 years or a colonoscopy every 10 years starting at age 77.  Hepatitis C blood test.  Hepatitis B blood test.  Sexually transmitted disease (STD) testing.  Diabetes screening. This is done by checking your blood sugar (glucose) after you have not eaten for a while (fasting). You may have this done every 1-3 years.  Bone density scan. This is done to screen for osteoporosis. You may have this done starting at age 77.  Mammogram. This may be done every 1-2 years. Talk to your health care provider about how often you should have regular mammograms. Talk with your health care provider about  your test results, treatment options, and if necessary, the need for more tests. Vaccines  Your health care provider may recommend certain vaccines, such as:  Influenza vaccine. This is recommended every year.  Tetanus, diphtheria, and acellular pertussis (Tdap, Td) vaccine. You may need a Td booster every 10  years.  Zoster vaccine. You may need this after age 46.  Pneumococcal 13-valent conjugate (PCV13) vaccine. One dose is recommended after age 79.  Pneumococcal polysaccharide (PPSV23) vaccine. One dose is recommended after age 52. Talk to your health care provider about which screenings and vaccines you need and how often you need them. This information is not intended to replace advice given to you by your health care provider. Make sure you discuss any questions you have with your health care provider. Document Released: 05/02/2015 Document Revised: 12/24/2015 Document Reviewed: 02/04/2015 Elsevier Interactive Patient Education  2017 [place] Prevention in the Home Falls can cause injuries. They can happen to people of all ages. There are many things you can do to make your home safe and to help prevent falls. What can I do on the outside of my home?  Regularly fix the edges of walkways and driveways and fix any cracks.  Remove anything that might make you trip as you walk through a door, such as a raised step or threshold.  Trim any bushes or trees on the path to your home.  Use bright outdoor lighting.  Clear any walking paths of anything that might make someone trip, such as rocks or tools.  Regularly check to see if handrails are loose or broken. Make sure that both sides of any steps have handrails.  Any raised decks and porches should have guardrails on the edges.  Have any leaves, snow, or ice cleared regularly.  Use sand or salt on walking paths during winter.  Clean up any spills in your garage right away. This includes oil or grease spills. What can I do in the bathroom?  Use night lights.  Install grab bars by the toilet and in the tub and shower. Do not use towel bars as grab bars.  Use non-skid mats or decals in the tub or shower.  If you need to sit down in the shower, use a plastic, non-slip stool.  Keep the floor dry. Clean up any water that  spills on the floor as soon as it happens.  Remove soap buildup in the tub or shower regularly.  Attach bath mats securely with double-sided non-slip rug tape.  Do not have throw rugs and other things on the floor that can make you trip. What can I do in the bedroom?  Use night lights.  Make sure that you have a light by your bed that is easy to reach.  Do not use any sheets or blankets that are too big for your bed. They should not hang down onto the floor.  Have a firm chair that has side arms. You can use this for support while you get dressed.  Do not have throw rugs and other things on the floor that can make you trip. What can I do in the kitchen?  Clean up any spills right away.  Avoid walking on wet floors.  Keep items that you use a lot in easy-to-reach places.  If you need to reach something above you, use a strong step stool that has a grab bar.  Keep electrical cords out of the way.  Do not use floor polish or wax  that makes floors slippery. If you must use wax, use non-skid floor wax.  Do not have throw rugs and other things on the floor that can make you trip. What can I do with my stairs?  Do not leave any items on the stairs.  Make sure that there are handrails on both sides of the stairs and use them. Fix handrails that are broken or loose. Make sure that handrails are as long as the stairways.  Check any carpeting to make sure that it is firmly attached to the stairs. Fix any carpet that is loose or worn.  Avoid having throw rugs at the top or bottom of the stairs. If you do have throw rugs, attach them to the floor with carpet tape.  Make sure that you have a light switch at the top of the stairs and the bottom of the stairs. If you do not have them, ask someone to add them for you. What else can I do to help prevent falls?  Wear shoes that:  Do not have high heels.  Have rubber bottoms.  Are comfortable and fit you well.  Are closed at the  toe. Do not wear sandals.  If you use a stepladder:  Make sure that it is fully opened. Do not climb a closed stepladder.  Make sure that both sides of the stepladder are locked into place.  Ask someone to hold it for you, if possible.  Clearly mark and make sure that you can see:  Any grab bars or handrails.  First and last steps.  Where the edge of each step is.  Use tools that help you move around (mobility aids) if they are needed. These include:  Canes.  Walkers.  Scooters.  Crutches.  Turn on the lights when you go into a dark area. Replace any light bulbs as soon as they burn out.  Set up your furniture so you have a clear path. Avoid moving your furniture around.  If any of your floors are uneven, fix them.  If there are any pets around you, be aware of where they are.  Review your medicines with your doctor. Some medicines can make you feel dizzy. This can increase your chance of falling. Ask your doctor what other things that you can do to help prevent falls. This information is not intended to replace advice given to you by your health care provider. Make sure you discuss any questions you have with your health care provider. Document Released: 01/30/2009 Document Revised: 09/11/2015 Document Reviewed: 05/10/2014 Elsevier Interactive Patient Education  2017 [employer].

## 2017-06-08 NOTE — Progress Notes (Signed)
Subjective:   Lauren Lawrence is a 77 y.o. female who presents for Medicare Annual (Subsequent) preventive examination.  Review of Systems:  N/A  Cardiac Risk Factors include: advanced age (>81men, >41 women);dyslipidemia;hypertension     Objective:     Vitals: BP (!) 200/72 (BP Location: Right Arm)   Pulse 80   Temp 98 F (36.7 C) (Oral)   Ht 5' (1.524 m)   Wt 120 lb (54.4 kg)   BMI 23.44 kg/m   Body mass index is 23.44 kg/m.  Advanced Directives 06/08/2017 02/17/2016 07/14/2015 03/10/2015 03/10/2015  Does Patient Have a Medical Advance Directive? No Yes Yes No;Yes No  Does patient want to make changes to medical advance directive? Yes (MAU/Ambulatory/Procedural Areas - Information given) No - Patient declined - - -  Copy of Healthcare Power of Attorney in Chart? - No - copy requested - - -    Tobacco Social History   Tobacco Use  Smoking Status Never Smoker  Smokeless Tobacco Never Used     Counseling given: Not Answered   Clinical Intake:  Pre-visit preparation completed: Yes  Pain : No/denies pain Pain Score: 0-No pain     Nutritional Status: BMI of 19-24  Normal Nutritional Risks: None Diabetes: No  How often do you need to have someone help you when you read instructions, pamphlets, or other written materials from your doctor or pharmacy?: 1 - Never  Interpreter Needed?: No  Information entered by :: [facility], LPN  Past Medical History:  Diagnosis Date  . Depression   . Hyperlipidemia   . Hypertension    History reviewed. No pertinent surgical history. Family History  Problem Relation Age of Onset  . Heart disease Mother   . Cancer Mother        breat  . Dementia Father   . Hypertension Father   . Heart disease Father   . Gout Father   . Heart disease Brother        MI with CABG X3   Social History   Socioeconomic History  . Marital status: Widowed    Spouse name: Lauren Lawrence  . Number of children: 2  . Years of education: 16  .  Highest education level: Bachelor's degree (e.g., BA, AB, BS)  Social Needs  . Financial resource strain: Not hard at all  . Food insecurity - worry: Never true  . Food insecurity - inability: Never true  . Transportation needs - medical: No  . Transportation needs - non-medical: No  Occupational History  . Occupation: retired  Tobacco Use  . Smoking status: Never Smoker  . Smokeless tobacco: Never Used  Substance and Sexual Activity  . Alcohol use: No  . Drug use: No  . Sexual activity: No  Other Topics Concern  . None  Social History Narrative  . None    Outpatient Encounter Medications as of 06/08/2017  Medication Sig  . Ascorbic Acid 500 MG CAPS Take by mouth daily.   . Cholecalciferol 1000 UNITS TBDP Take by mouth daily.   . fluticasone (FLONASE) 50 MCG/ACT nasal spray USE 1-2 SQUIRTS EACH NOSTRIL DAILY (Patient taking differently: USE 1-2 SQUIRTS EACH NOSTRIL DAILY as needed)  . hydrochlorothiazide (HYDRODIURIL) 25 MG tablet TAKE 1 TABLET BY MOUTH EVERY DAY AS NEEDED  . levothyroxine (SYNTHROID, LEVOTHROID) 88 MCG tablet TAKE 1 TABLET BY MOUTH ONCE A DAY  . losartan (COZAAR) 100 MG tablet TAKE 1 TABLET (100 MG TOTAL) BY MOUTH DAILY.  . Magnesium 400 MG CAPS Take  by mouth daily.   . metoprolol succinate (TOPROL-XL) 50 MG 24 hr tablet TAKE 1 TABLET BY MOUTH DAILY. TAKE WITH OR IMMEDIATELY FOLLOWING A MEAL.*PATIENT NEEDS APPT*  . Milk Thistle-Dand-Fennel-Licor (MILK THISTLE XTRA) CAPS Take by mouth.   . raloxifene (EVISTA) 60 MG tablet TAKE 1 TABLET BY MOUTH ONCE A DAY (Patient taking differently: TAKE 1 TABLET BY MOUTH ONCE A DAY (takes a couple times a week))  . traZODone (DESYREL) 50 MG tablet Take 0.5-1 tablets (25-50 mg total) by mouth at bedtime as needed for sleep.  . vitamin E 400 UNIT capsule Take 400 Units by mouth daily.    No facility-administered encounter medications on file as of 06/08/2017.     Activities of Daily Living In your present state of health, do  you have any difficulty performing the following activities: 06/08/2017  Hearing? N  Vision? N  Difficulty concentrating or making decisions? Y  Walking or climbing stairs? N  Dressing or bathing? N  Doing errands, shopping? N  Preparing Food and eating ? N  Using the Toilet? N  In the past six months, have you accidently leaked urine? N  Do you have problems with loss of bowel control? N  Managing your Medications? N  Managing your Finances? N  Housekeeping or managing your Housekeeping? N  Some recent data might be hidden    Patient Care Team: [name], MD as PCP - General (Family Medicine) Irene Limbo., MD as Consulting Physician (Ophthalmology)    Assessment:   This is a routine wellness examination for Lauren Lawrence.  Exercise Activities and Dietary recommendations Current Exercise Habits: Home exercise routine, Type of exercise: treadmill;walking, Time (Minutes): 20, Frequency (Times/Week): 2, Weekly Exercise (Minutes/Week): 40, Intensity: Mild  Goals    . DIET - INCREASE WATER INTAKE     Recommend increasing water intake to 4-6 glasses a day.        Fall Risk Fall Risk  06/08/2017 02/17/2016 01/07/2016 09/16/2015 02/04/2015  Falls in the past year? No No No No No   Is the patient's home free of loose throw rugs in walkways, pet beds, electrical cords, etc?   yes      Grab bars in the bathroom? yes      Handrails on the stairs?   yes      Adequate lighting?   yes  Timed Get Up and Go performed: N/A  Depression Screen PHQ 2/9 Scores 06/08/2017 06/08/2017 02/17/2016 01/07/2016  PHQ - 2 Score 0 0 1 0  PHQ- 9 Score 1 - - -     Cognitive Function     6CIT Screen 06/08/2017 02/17/2016 01/07/2016  What Year? 0 points 0 points 0 points  What month? 0 points 0 points 0 points  What time? 0 points 0 points 0 points  Count back from 20 0 points 0 points 0 points  Months in reverse 0 points 0 points 2 points  Repeat phrase 0 points 0 points 0 points  Total  Score 0 0 2    Immunization History  Administered Date(s) Administered  . Hepatitis B, adult 03/04/2011, 04/08/2011  . Influenza Split 02/24/2012  . Influenza, High Dose Seasonal PF 02/19/2014, 02/04/2015, 01/07/2016, 06/08/2017  . Pneumococcal Conjugate-13 05/21/2014  . Pneumococcal Polysaccharide-23 06/29/2011  . Tdap 01/03/2007    Qualifies for Shingles Vaccine? Due for Shingles vaccine. Declined my offer to administer today. Education has been provided regarding the importance of this vaccine. Pt has been advised to call her insurance  company to determine her out of pocket expense. Advised she may also receive this vaccine at her local pharmacy or Health Dept. Verbalized acceptance and understanding.  Screening Tests Health Maintenance  Topic Date Due  . TETANUS/TDAP  01/02/2017  . INFLUENZA VACCINE  Completed  . DEXA SCAN  Completed  . PNA vac Low Risk Adult  Completed    Cancer Screenings: Lung: Low Dose CT Chest recommended if Age 59-80 years, 30 pack-year currently smoking OR have quit w/in 15years. Patient does not qualify. Breast:  Up to date on Mammogram? Yes   Up to date of Bone Density/Dexa? Yes Colorectal: Up to date  Additional Screenings:  Hepatitis C Screening: N/A     Plan:  I have personally reviewed and addressed the Medicare Annual Wellness questionnaire and have noted the following in the patient's chart:  A. Medical and social history B. Use of alcohol, tobacco or illicit drugs  C. Current medications and supplements D. Functional ability and status E.  Nutritional status F.  Physical activity G. Advance directives H. List of other physicians I.  Hospitalizations, surgeries, and ER visits in previous 12 months J.  Vitals K. Screenings such as hearing and vision if needed, cognitive and depression L. Referrals and appointments - none  In addition, I have reviewed and discussed with patient certain preventive protocols, quality metrics, and best  practice recommendations. A written personalized care plan for preventive services as well as general preventive health recommendations were provided to patient.  See attached scanned questionnaire for additional information.   Signed,  Hyacinth MeekerMckenzie Averey Koning, LPN Nurse Health Advisor   Nurse Recommendations: BP is high on first and second check. Per Dr Sullivan LoneGilbert- ok to work in. Pt declined the tetanus vaccine today.

## 2017-06-08 NOTE — Progress Notes (Signed)
Patient: Lauren Lawrence Female    DOB: [birth year]   77 y.o.   MRN: [id number] Visit Date: 06/08/2017  Today's Provider: [name], MD   Chief Complaint  Patient presents with  . Hypertension   Subjective:    HPI   The patient was n beng seen by the nurse health advisor and her blood pressure was found to be elevated.  She states that she does not check it often at home but when she has, she had gotten readings with systolics of 140's-160.  She does not remember the diastolic number.  No Known Allergies   Current Outpatient Medications:  .  Ascorbic Acid 500 MG CAPS, Take by mouth daily. , Disp: , Rfl:  .  Cholecalciferol 1000 UNITS TBDP, Take by mouth daily. , Disp: , Rfl:  .  fluticasone (FLONASE) 50 MCG/ACT nasal spray, USE 1-2 SQUIRTS EACH NOSTRIL DAILY (Patient taking differently: USE 1-2 SQUIRTS EACH NOSTRIL DAILY as needed), Disp: 16 g, Rfl: 12 .  hydrochlorothiazide (HYDRODIURIL) 25 MG tablet, TAKE 1 TABLET BY MOUTH EVERY DAY AS NEEDED, Disp: 30 tablet, Rfl: 12 .  levothyroxine (SYNTHROID, LEVOTHROID) 88 MCG tablet, TAKE 1 TABLET BY MOUTH ONCE A DAY, Disp: 90 tablet, Rfl: 2 .  losartan (COZAAR) 100 MG tablet, TAKE 1 TABLET (100 MG TOTAL) BY MOUTH DAILY., Disp: 30 tablet, Rfl: 0 .  metoprolol succinate (TOPROL-XL) 50 MG 24 hr tablet, TAKE 1 TABLET BY MOUTH DAILY. TAKE WITH OR IMMEDIATELY FOLLOWING A MEAL.*PATIENT NEEDS APPT*, Disp: 30 tablet, Rfl: 0 .  Milk Thistle-Dand-Fennel-Licor (MILK THISTLE XTRA) CAPS, Take by mouth. , Disp: , Rfl:  .  raloxifene (EVISTA) 60 MG tablet, TAKE 1 TABLET BY MOUTH ONCE A DAY (Patient taking differently: TAKE 1 TABLET BY MOUTH ONCE A DAY (takes a couple times a week)), Disp: 90 tablet, Rfl: 3 .  traZODone (DESYREL) 50 MG tablet, Take 0.5-1 tablets (25-50 mg total) by mouth at bedtime as needed for sleep., Disp: 90 tablet, Rfl: 1 .  vitamin E 400 UNIT capsule, Take 400 Units by mouth daily. , Disp: , Rfl:  .  Magnesium 400 MG CAPS,  Take by mouth daily. , Disp: , Rfl:   Review of Systems  Constitutional: Negative.  Negative for fatigue and fever.  HENT: Negative.   Eyes: Negative.   Respiratory: Negative.   Cardiovascular: Positive for leg swelling (ankles). Negative for chest pain.  Gastrointestinal: Negative.   Endocrine: Negative.   Genitourinary: Negative.   Musculoskeletal: Negative.   Skin: Negative.   Allergic/Immunologic: Negative.   Neurological: Negative for dizziness (no headaches).  Hematological: Negative.   Psychiatric/Behavioral: Negative.     Social History   Tobacco Use  . Smoking status: Never Smoker  . Smokeless tobacco: Never Used  Substance Use Topics  . Alcohol use: No   Objective:   BP (!) 202/72   Pulse 80   Temp 98 F (36.7 C)   Wt 120 lb (54.4 kg)   BMI 23.44 kg/m  Vitals:   06/08/17 1110  BP: (!) 202/72  Pulse: 80  Temp: 98 F (36.7 C)  Weight: 120 lb (54.4 kg)     Physical Exam  Constitutional: She is oriented to person, place, and time. She appears well-developed and well-nourished.  HENT:  Head: Normocephalic.  Eyes: Pupils are equal, round, and reactive to light.  Neck: Normal range of motion.  Cardiovascular: Normal rate, regular rhythm and normal heart sounds.  Pulmonary/Chest: Effort normal and  breath sounds normal.  Abdominal: Soft.  Neurological: She is alert and oriented to person, place, and time.  Skin: Skin is warm.  Psychiatric: She has a normal mood and affect. Her behavior is normal. Judgment and thought content normal.        Assessment & Plan:     1. Essential (primary) hypertension  - amLODipine (NORVASC) 5 MG tablet; Take 1 tablet (5 mg total) by mouth daily.  Dispense: 90 tablet; Refill: 3 - CBC with Differential/Platelet - Comprehensive metabolic panel  2. 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 [employer]  technology has been used in this note in any air is in the dictation or transcription are unintentional.  Megan Mansichard Gilbert Jr, MD  [facility]

## 2017-06-09 LAB — COMPREHENSIVE METABOLIC PANEL
ALBUMIN: 4.5 g/dL (ref 3.5–4.8)
ALK PHOS: 44 IU/L (ref 39–117)
ALT: 29 IU/L (ref 0–32)
AST: 42 IU/L — ABNORMAL HIGH (ref 0–40)
Albumin/Globulin Ratio: 2.4 — ABNORMAL HIGH (ref 1.2–2.2)
BILIRUBIN TOTAL: 0.8 mg/dL (ref 0.0–1.2)
BUN / CREAT RATIO: 12 (ref 12–28)
BUN: 8 mg/dL (ref 8–27)
CHLORIDE: 99 mmol/L (ref 96–106)
CO2: 19 mmol/L — ABNORMAL LOW (ref 20–29)
Calcium: 9.6 mg/dL (ref 8.7–10.3)
Creatinine, Ser: 0.67 mg/dL (ref 0.57–1.00)
GFR calc Af Amer: 99 mL/min/{1.73_m2} (ref >59)
GFR calc non Af Amer: 86 mL/min/{1.73_m2} (ref >59)
GLUCOSE: 88 mg/dL (ref 65–99)
Globulin, Total: 1.9 g/dL (ref 1.5–4.5)
POTASSIUM: 4.6 mmol/L (ref 3.5–5.2)
Sodium: 135 mmol/L (ref 134–144)
Total Protein: 6.4 g/dL (ref 6.0–8.5)

## 2017-06-09 LAB — LIPID PANEL WITH LDL/HDL RATIO
Cholesterol, Total: 126 mg/dL (ref 100–199)
HDL: 61 mg/dL (ref >39)
LDL Calculated: 52 mg/dL (ref 0–99)
LDl/HDL Ratio: 0.9 ratio (ref 0.0–3.2)
Triglycerides: 64 mg/dL (ref 0–149)
VLDL CHOLESTEROL CAL: 13 mg/dL (ref 5–40)

## 2017-06-09 LAB — CBC WITH DIFFERENTIAL/PLATELET
BASOS ABS: 0 10*3/uL (ref 0.0–0.2)
Basos: 1 %
EOS (ABSOLUTE): 0.1 10*3/uL (ref 0.0–0.4)
Eos: 1 %
HEMOGLOBIN: 14.1 g/dL (ref 11.1–15.9)
Hematocrit: 40.6 % (ref 34.0–46.6)
Immature Grans (Abs): 0 10*3/uL (ref 0.0–0.1)
Immature Granulocytes: 0 %
LYMPHS ABS: 0.7 10*3/uL (ref 0.7–3.1)
Lymphs: 20 %
MCH: 31.7 pg (ref 26.6–33.0)
MCHC: 34.7 g/dL (ref 31.5–35.7)
MCV: 91 fL (ref 79–97)
MONOS ABS: 0.5 10*3/uL (ref 0.1–0.9)
Monocytes: 14 %
Neutrophils Absolute: 2.3 10*3/uL (ref 1.4–7.0)
Neutrophils: 64 %
Platelets: 72 10*3/uL — CL (ref 150–379)
RBC: 4.45 x10E6/uL (ref 3.77–5.28)
RDW: 14.7 % (ref 12.3–15.4)
WBC: 3.6 10*3/uL (ref 3.4–10.8)

## 2017-06-09 LAB — TSH: TSH: 0.775 u[IU]/mL (ref 0.450–4.500)

## 2017-06-15 ENCOUNTER — Ambulatory Visit (INDEPENDENT_AMBULATORY_CARE_PROVIDER_SITE_OTHER): Payer: Medicare Other | Admitting: Family Medicine

## 2017-06-15 VITALS — BP 154/68 | HR 62 | Temp 97.7°F | Resp 16 | Ht 60.0 in | Wt 119.0 lb

## 2017-06-15 DIAGNOSIS — Z1239 Encounter for other screening for malignant neoplasm of breast: Secondary | ICD-10-CM

## 2017-06-15 DIAGNOSIS — Z1231 Encounter for screening mammogram for malignant neoplasm of breast: Secondary | ICD-10-CM | POA: Diagnosis not present

## 2017-06-15 DIAGNOSIS — Z0000 Encounter for general adult medical examination without abnormal findings: Secondary | ICD-10-CM

## 2017-06-15 DIAGNOSIS — Z1211 Encounter for screening for malignant neoplasm of colon: Secondary | ICD-10-CM

## 2017-06-15 DIAGNOSIS — M810 Age-related osteoporosis without current pathological fracture: Secondary | ICD-10-CM | POA: Diagnosis not present

## 2017-06-15 LAB — IFOBT (OCCULT BLOOD): IFOBT: NEGATIVE

## 2017-06-15 NOTE — Progress Notes (Signed)
Patient: Lauren Lawrence, Female    DOB: May 18, 1940, 77 y.o.   MRN: [id number] Visit Date: 06/15/2017  Today's Provider: [name], MD   Chief Complaint  Patient presents with  . Annual Exam   Subjective:    Annual physical exam Lauren Lawrence is a 77 y.o. female who presents today for health maintenance and complete physical. She feels well. She reports she is not exercising often enough. She reports she is sleeping well, once she gets to sleep. She has 2 children and 2 grandchildren.Her husband died 08/04/2018after 50 years of marriage. ----------------------------------------------------------------- Colonoscopy- 06/19/13 [place]. Diverticulosis BMD- 12/10/14 osteoporosis Mammogram- 05/31/14 negative Pap- 12/05/14 negative   Review of Systems  Constitutional: Negative.   HENT: Negative.   Eyes: Negative.   Respiratory: Negative.   Cardiovascular: Positive for leg swelling (just around her ankles).  Gastrointestinal: Negative.   Endocrine: Negative.   Genitourinary: Negative.   Musculoskeletal: Negative.   Skin: Negative.   Allergic/Immunologic: Negative.   Neurological: Negative.   Hematological: Negative.   Psychiatric/Behavioral: Negative.     Social History      She  reports that  has never smoked. she has never used smokeless tobacco. She reports that she does not drink alcohol or use drugs.       Social History   Socioeconomic History  . Marital status: Widowed    Spouse name: Alfredo Bach  . Number of children: 2  . Years of education: 16  . Highest education level: Bachelor's degree (e.g., BA, AB, BS)  Social Needs  . Financial resource strain: Not hard at all  . Food insecurity - worry: Never true  . Food insecurity - inability: Never true  . Transportation needs - medical: No  . Transportation needs - non-medical: No  Occupational History  . Occupation: retired  Tobacco Use  . Smoking status: Never Smoker  . Smokeless tobacco: Never Used    Substance and Sexual Activity  . Alcohol use: No  . Drug use: No  . Sexual activity: No  Other Topics Concern  . Not on file  Social History Narrative  . Not on file    Past Medical History:  Diagnosis Date  . Depression   . Hyperlipidemia   . Hypertension      Patient Active Problem List   Diagnosis Date Noted  . Allergic rhinitis 10/17/2014  . Absolute anemia 10/17/2014  . Cyst of thyroid 10/17/2014  . Clinical depression 10/17/2014  . Essential (primary) hypertension 10/17/2014  . Fatty infiltration of liver 10/17/2014  . Combined fat and carbohydrate induced hyperlipemia 10/17/2014  . Acquired hypothyroidism 10/17/2014  . Decreased leukocytes 10/17/2014  . OP (osteoporosis) 10/17/2014  . Enlargement of spleen 10/17/2014  . Phlebectasia 10/17/2014  . Avitaminosis D 10/17/2014  . HLD (hyperlipidemia) 07/25/2014    No past surgical history on file.  Family History        Family Status  Relation Name Status  . Mother  Deceased at age 70       cause of death was MI  . Father  Deceased  . Brother  Alive  . Daughter  Alive  . Daughter  Alive        Her family history includes Cancer in her mother; Dementia in her father; Gout in her father; Heart disease in her brother, father, and mother; Hypertension in her father.      No Known Allergies   Current Outpatient Medications:  .  amLODipine (NORVASC) 5 MG tablet, Take 1 tablet (5 mg total) by mouth daily., Disp: 90 tablet, Rfl: 3 .  Ascorbic Acid 500 MG CAPS, Take by mouth daily. , Disp: , Rfl:  .  Cholecalciferol 1000 UNITS TBDP, Take by mouth daily. , Disp: , Rfl:  .  fluticasone (FLONASE) 50 MCG/ACT nasal spray, USE 1-2 SQUIRTS EACH NOSTRIL DAILY (Patient taking differently: USE 1-2 SQUIRTS EACH NOSTRIL DAILY as needed), Disp: 16 g, Rfl: 12 .  hydrochlorothiazide (HYDRODIURIL) 25 MG tablet, TAKE 1 TABLET BY MOUTH EVERY DAY AS NEEDED, Disp: 30 tablet, Rfl: 12 .  levothyroxine (SYNTHROID, LEVOTHROID) 88 MCG  tablet, TAKE 1 TABLET BY MOUTH ONCE A DAY, Disp: 90 tablet, Rfl: 2 .  losartan (COZAAR) 100 MG tablet, TAKE 1 TABLET (100 MG TOTAL) BY MOUTH DAILY., Disp: 30 tablet, Rfl: 0 .  Magnesium 400 MG CAPS, Take by mouth daily. , Disp: , Rfl:  .  metoprolol succinate (TOPROL-XL) 50 MG 24 hr tablet, TAKE 1 TABLET BY MOUTH DAILY. TAKE WITH OR IMMEDIATELY FOLLOWING A MEAL.*PATIENT NEEDS APPT*, Disp: 30 tablet, Rfl: 0 .  Milk Thistle-Dand-Fennel-Licor (MILK THISTLE XTRA) CAPS, Take by mouth. , Disp: , Rfl:  .  raloxifene (EVISTA) 60 MG tablet, TAKE 1 TABLET BY MOUTH ONCE A DAY (Patient taking differently: TAKE 1 TABLET BY MOUTH ONCE A DAY (takes a couple times a week)), Disp: 90 tablet, Rfl: 3 .  traZODone (DESYREL) 50 MG tablet, Take 0.5-1 tablets (25-50 mg total) by mouth at bedtime as needed for sleep., Disp: 90 tablet, Rfl: 1 .  vitamin E 400 UNIT capsule, Take 400 Units by mouth daily. , Disp: , Rfl:    Patient Care Team: [name], MD as PCP - General (Family Medicine) Irene Limbo., MD as Consulting Physician (Ophthalmology)      Objective:   Vitals: BP (!) 154/68 (BP Location: Left Arm, Patient Position: Sitting, Cuff Size: Normal)   Pulse 62   Temp 97.7 F (36.5 C) (Oral)   Resp 16   Ht 5' (1.524 m)   Wt 119 lb (54 kg)   BMI 23.24 kg/m    Vitals:   06/15/17 1007  BP: (!) 154/68  Pulse: 62  Resp: 16  Temp: 97.7 F (36.5 C)  TempSrc: Oral  Weight: 119 lb (54 kg)  Height: 5' (1.524 m)     Physical Exam  Constitutional: She is oriented to person, place, and time. She appears well-developed and well-nourished.  HENT:  Head: Normocephalic and atraumatic.  Right Ear: External ear normal.  Left Ear: External ear normal.  Nose: Nose normal.  Mouth/Throat: Oropharynx is clear and moist.  Eyes: Conjunctivae and EOM are normal. Pupils are equal, round, and reactive to light.  Neck: Normal range of motion. Neck supple. No thyromegaly present.  Small stable nodule  isthmus of thyroid.  Cardiovascular: Normal rate, regular rhythm, normal heart sounds and intact distal pulses.  Pulmonary/Chest: Effort normal and breath sounds normal.  Abdominal: Soft. Bowel sounds are normal.  Musculoskeletal: Normal range of motion.  Neurological: She is alert and oriented to person, place, and time. She has normal reflexes.  Skin: Skin is warm and dry.  Psychiatric: She has a normal mood and affect. Her behavior is normal. Judgment and thought content normal.     Depression Screen PHQ 2/9 Scores 06/08/2017 06/08/2017 02/17/2016 01/07/2016  PHQ - 2 Score 0 0 1 0  PHQ- 9 Score 1 - - -      Assessment &  Plan:     Routine Health Maintenance and Physical Exam  Exercise Activities and Dietary recommendations Goals    . DIET - INCREASE WATER INTAKE     Recommend increasing water intake to 4-6 glasses a day.        Immunization History  Administered Date(s) Administered  . Hepatitis B, adult 03/04/2011, 04/08/2011  . Influenza Split 02/24/2012  . Influenza, High Dose Seasonal PF 02/19/2014, 02/04/2015, 01/07/2016, 06/08/2017  . Pneumococcal Conjugate-13 05/21/2014  . Pneumococcal Polysaccharide-23 06/29/2011  . Tdap 01/03/2007    Health Maintenance  Topic Date Due  . Janet BerlinETANUS/TDAP  01/02/2017  . INFLUENZA VACCINE  Completed  . DEXA SCAN  Completed  . PNA vac Low Risk Adult  Completed     Discussed health benefits of physical activity, and encouraged her to engage in regular exercise appropriate for her age and condition.    --------------------------------------------------------------------   I have done the exam and reviewed the above chart and it is accurate to the best of my knowledge. Dentist[employer]agon  technology has been used in this note in any air is in the dictation or transcription are unintentional.  Megan Mansichard Stiles Maxcy Jr, MD  [facility]

## 2017-07-03 ENCOUNTER — Other Ambulatory Visit: Payer: Self-pay | Admitting: Family Medicine

## 2017-07-03 DIAGNOSIS — I10 Essential (primary) hypertension: Secondary | ICD-10-CM

## 2017-07-19 ENCOUNTER — Other Ambulatory Visit: Payer: Medicare Other

## 2017-08-17 ENCOUNTER — Ambulatory Visit
Admission: RE | Admit: 2017-08-17 | Discharge: 2017-08-17 | Disposition: A | Discharge status: Home / Self Care | Payer: Medicare Other | Source: Ambulatory Visit | Attending: Family Medicine | Admitting: Family Medicine

## 2017-08-17 DIAGNOSIS — Z1231 Encounter for screening mammogram for malignant neoplasm of breast: Secondary | ICD-10-CM | POA: Diagnosis not present

## 2017-08-17 DIAGNOSIS — M810 Age-related osteoporosis without current pathological fracture: Secondary | ICD-10-CM | POA: Insufficient documentation

## 2017-08-17 DIAGNOSIS — Z1239 Encounter for other screening for malignant neoplasm of breast: Secondary | ICD-10-CM

## 2017-08-25 ENCOUNTER — Telehealth: Payer: Self-pay

## 2017-08-25 NOTE — Telephone Encounter (Signed)
Tried calling patient, no answer. Will try again later.  

## 2017-08-25 NOTE — Telephone Encounter (Signed)
-----   Message from Lauren Lawrence., MD sent at 08/25/2017  3:50 PM EDT ----- Mild progression of Osteoporosis--continue Raloxifene--consider Endocrine referral.

## 2017-08-26 NOTE — Telephone Encounter (Signed)
[id number] 

## 2017-08-30 NOTE — Telephone Encounter (Signed)
Advised 

## 2017-10-17 ENCOUNTER — Encounter: Payer: Self-pay | Admitting: Family Medicine

## 2017-10-17 ENCOUNTER — Ambulatory Visit (INDEPENDENT_AMBULATORY_CARE_PROVIDER_SITE_OTHER): Payer: Medicare Other | Admitting: Family Medicine

## 2017-10-17 VITALS — BP 138/58 | HR 58 | Temp 97.9°F | Resp 16 | Ht 60.0 in | Wt 119.0 lb

## 2017-10-17 DIAGNOSIS — F329 Major depressive disorder, single episode, unspecified: Secondary | ICD-10-CM

## 2017-10-17 DIAGNOSIS — E039 Hypothyroidism, unspecified: Secondary | ICD-10-CM

## 2017-10-17 DIAGNOSIS — I10 Essential (primary) hypertension: Secondary | ICD-10-CM

## 2017-10-17 NOTE — Progress Notes (Signed)
Patient: Lauren DurhamMitzy C Faraci Female    DOB: 30-Mar-1941   77 y.o.   MRN: [id number] Visit Date: 10/17/2017  Today's Provider: Megan Mansichard Gilbert Jr, MD   Chief Complaint  Patient presents with  . Hypertension  . Hypothyroidism  . Osteoporosis  . Insomnia   Subjective:    HPI Patient is a 77 year old female who presents today for a follow up. She was last seen in the office 4 months ago.   Hypertension- No changes were made in her medications since last visit. She is currently taking amlodipine 5mg  daily, losartan 100mg  daily, and HCTZ 25mg  daily. She reports good compliance and good symptom control.  BP Readings from Last 3 Encounters:  10/17/17 (!) 138/58  06/15/17 (!) 154/68  06/08/17 (!) 200/72    Hypothyroidism- No changes were made since last visit. Patient is currently taking levothyroxine 88mcg daily, and reports good compliance and good symptom control.  Lab Results  Component Value Date   TSH 0.775 06/08/2017   Osteoporosis- Patient had her bone density test done on 08/17/2017, and it showed mild progression of osteoporosis. She is currently taking Evisita 60mg  and reports that she is tolerating well.     No Known Allergies   Current Outpatient Medications:  .  amLODipine (NORVASC) 5 MG tablet, Take 1 tablet (5 mg total) by mouth daily., Disp: 90 tablet, Rfl: 3 .  Ascorbic Acid 500 MG CAPS, Take by mouth daily. , Disp: , Rfl:  .  Cholecalciferol 1000 UNITS TBDP, Take by mouth daily. , Disp: , Rfl:  .  fluticasone (FLONASE) 50 MCG/ACT nasal spray, USE 1-2 SQUIRTS EACH NOSTRIL DAILY (Patient taking differently: USE 1-2 SQUIRTS EACH NOSTRIL DAILY as needed), Disp: 16 g, Rfl: 12 .  hydrochlorothiazide (HYDRODIURIL) 25 MG tablet, TAKE 1 TABLET BY MOUTH EVERY DAY AS NEEDED, Disp: 30 tablet, Rfl: 12 .  levothyroxine (SYNTHROID, LEVOTHROID) 88 MCG tablet, TAKE 1 TABLET BY MOUTH ONCE A DAY, Disp: 90 tablet, Rfl: 2 .  losartan (COZAAR) 100 MG tablet, TAKE 1 TABLET BY MOUTH EVERY  DAY, Disp: 90 tablet, Rfl: 3 .  Magnesium 400 MG CAPS, Take by mouth daily. , Disp: , Rfl:  .  metoprolol succinate (TOPROL-XL) 50 MG 24 hr tablet, TAKE 1 TABLET BY MOUTH DAILY. TAKE WITH OR IMMEDIATELY FOLLOWING A MEAL.*PATIENT NEEDS APPT*, Disp: 90 tablet, Rfl: 3 .  Milk Thistle-Dand-Fennel-Licor (MILK THISTLE XTRA) CAPS, Take by mouth. , Disp: , Rfl:  .  raloxifene (EVISTA) 60 MG tablet, TAKE 1 TABLET BY MOUTH ONCE A DAY (Patient taking differently: TAKE 1 TABLET BY MOUTH ONCE A DAY (takes a couple times a week)), Disp: 90 tablet, Rfl: 3 .  traZODone (DESYREL) 50 MG tablet, Take 0.5-1 tablets (25-50 mg total) by mouth at bedtime as needed for sleep., Disp: 90 tablet, Rfl: 1 .  vitamin E 400 UNIT capsule, Take 400 Units by mouth daily. , Disp: , Rfl:   Review of Systems  Constitutional: Negative.   HENT: Negative.   Eyes: Negative.   Respiratory: Negative.   Cardiovascular: Negative.   Endocrine: Negative.   Musculoskeletal: Negative.   Allergic/Immunologic: Negative.   Neurological: Negative.   Hematological: Negative.   Psychiatric/Behavioral: Negative.     Social History   Tobacco Use  . Smoking status: Never Smoker  . Smokeless tobacco: Never Used  Substance Use Topics  . Alcohol use: No   Objective:   BP (!) 138/58 (BP Location: Left Arm, Patient Position: Sitting,  Cuff Size: Normal)   Pulse (!) 58   Temp 97.9 F (36.6 C)   Resp 16   Ht 5' (1.524 m)   Wt 119 lb (54 kg)   BMI 23.24 kg/m  Vitals:   10/17/17 0836  BP: (!) 138/58  Pulse: (!) 58  Resp: 16  Temp: 97.9 F (36.6 C)  Weight: 119 lb (54 kg)  Height: 5' (1.524 m)     Physical Exam  Constitutional: She is oriented to person, place, and time. She appears well-developed and well-nourished.  HENT:  Head: Normocephalic and atraumatic.  Right Ear: External ear normal.  Left Ear: External ear normal.  Nose: Nose normal.  Eyes: Conjunctivae are normal. No scleral icterus.  Neck: No thyromegaly present.   Cardiovascular: Normal rate, regular rhythm and normal heart sounds.  Spider VV in legs.  Pulmonary/Chest: Effort normal and breath sounds normal.  Abdominal: Soft.  Musculoskeletal: She exhibits no edema.  Lymphadenopathy:    She has no cervical adenopathy.  Neurological: She is alert and oriented to person, place, and time.  Skin: Skin is warm and dry.  Psychiatric: She has a normal mood and affect. Her behavior is normal. Judgment and thought content normal.        Assessment & Plan:     HTN Osteoporosis BMD 2021.RTC 6 months. Hypothyroid     I have done the exam and reviewed the above chart and it is accurate to the best of my knowledge. Dentist has been used in this note in any air is in the dictation or transcription are unintentional.  [name], MD  [facility]

## 2018-02-05 ENCOUNTER — Other Ambulatory Visit: Payer: Self-pay | Admitting: Family Medicine

## 2018-02-23 ENCOUNTER — Encounter: Payer: Self-pay | Admitting: Family Medicine

## 2018-02-23 ENCOUNTER — Ambulatory Visit: Payer: Medicare Other | Admitting: Family Medicine

## 2018-02-23 VITALS — BP 132/60 | HR 68 | Temp 98.0°F | Resp 16 | Wt 120.0 lb

## 2018-02-23 DIAGNOSIS — M810 Age-related osteoporosis without current pathological fracture: Secondary | ICD-10-CM

## 2018-02-23 DIAGNOSIS — K760 Fatty (change of) liver, not elsewhere classified: Secondary | ICD-10-CM | POA: Diagnosis not present

## 2018-02-23 DIAGNOSIS — G3184 Mild cognitive impairment, so stated: Secondary | ICD-10-CM

## 2018-02-23 DIAGNOSIS — E039 Hypothyroidism, unspecified: Secondary | ICD-10-CM

## 2018-02-23 DIAGNOSIS — D72819 Decreased white blood cell count, unspecified: Secondary | ICD-10-CM

## 2018-02-23 DIAGNOSIS — Z23 Encounter for immunization: Secondary | ICD-10-CM | POA: Diagnosis not present

## 2018-02-23 DIAGNOSIS — I10 Essential (primary) hypertension: Secondary | ICD-10-CM

## 2018-02-23 NOTE — Progress Notes (Signed)
Patient: Lauren Lawrence Female    DOB: 1940-12-28   77 y.o.   MRN: [id number] Visit Date: 02/23/2018  Today's Provider: [name], MD   Chief Complaint  Patient presents with  . Hypertension   Subjective:    HPI  Hypertension, follow-up:  BP Readings from Last 3 Encounters:  02/23/18 132/60  10/17/17 (!) 138/58  06/15/17 (!) 154/68    She was last seen for hypertension 4 months ago.  BP at that visit was 138/58. Management since that visit includes no changes. She reports good compliance with treatment. She is not having side effects.  She is not exercising. She is adherent to low salt diet.   Outside blood pressures are checked occasionally. She is experiencing none.  Patient denies exertional chest pressure/discomfort, lower extremity edema and palpitations.    Weight trend: stable Wt Readings from Last 3 Encounters:  02/23/18 120 lb (54.4 kg)  10/17/17 119 lb (54 kg)  06/15/17 119 lb (54 kg)    Current diet: well balanced   No Known Allergies   Current Outpatient Medications:  .  Ascorbic Acid 500 MG CAPS, Take by mouth daily. , Disp: , Rfl:  .  Cholecalciferol 1000 UNITS TBDP, Take by mouth daily. , Disp: , Rfl:  .  fluticasone (FLONASE) 50 MCG/ACT nasal spray, USE 1-2 SQUIRTS EACH NOSTRIL DAILY (Patient taking differently: USE 1-2 SQUIRTS EACH NOSTRIL DAILY as needed), Disp: 16 g, Rfl: 12 .  hydrochlorothiazide (HYDRODIURIL) 25 MG tablet, TAKE 1 TABLET BY MOUTH EVERY DAY AS NEEDED, Disp: 30 tablet, Rfl: 12 .  levothyroxine (SYNTHROID, LEVOTHROID) 88 MCG tablet, TAKE 1 TABLET BY MOUTH EVERY DAY, Disp: 90 tablet, Rfl: 2 .  losartan (COZAAR) 100 MG tablet, TAKE 1 TABLET BY MOUTH EVERY DAY, Disp: 90 tablet, Rfl: 3 .  Magnesium 400 MG CAPS, Take by mouth daily. , Disp: , Rfl:  .  metoprolol succinate (TOPROL-XL) 50 MG 24 hr tablet, TAKE 1 TABLET BY MOUTH DAILY. TAKE WITH OR IMMEDIATELY FOLLOWING A MEAL.*PATIENT NEEDS APPT*, Disp: 90 tablet, Rfl:  3 .  Milk Thistle-Dand-Fennel-Licor (MILK THISTLE XTRA) CAPS, Take by mouth. , Disp: , Rfl:  .  raloxifene (EVISTA) 60 MG tablet, TAKE 1 TABLET BY MOUTH ONCE A DAY (Patient taking differently: TAKE 1 TABLET BY MOUTH ONCE A DAY (takes a couple times a week)), Disp: 90 tablet, Rfl: 3 .  traZODone (DESYREL) 50 MG tablet, Take 0.5-1 tablets (25-50 mg total) by mouth at bedtime as needed for sleep., Disp: 90 tablet, Rfl: 1 .  vitamin E 400 UNIT capsule, Take 400 Units by mouth daily. , Disp: , Rfl:  .  amLODipine (NORVASC) 5 MG tablet, Take 1 tablet (5 mg total) by mouth daily., Disp: 90 tablet, Rfl: 3  Review of Systems  Constitutional: Negative.   HENT: Negative.   Eyes: Negative.   Respiratory: Negative.   Cardiovascular: Negative.   Gastrointestinal: Negative.   Endocrine: Negative.   Skin: Negative.   Allergic/Immunologic: Negative.   Neurological:       Mild memory loss.  Hematological: Negative.   Psychiatric/Behavioral: Negative.     Social History   Tobacco Use  . Smoking status: Never Smoker  . Smokeless tobacco: Never Used  Substance Use Topics  . Alcohol use: No   Objective:   BP 132/60   Pulse 68   Temp 98 F (36.7 C)   Resp 16   Wt 120 lb (54.4 kg)   SpO2 100%  BMI 23.44 kg/m  Vitals:   02/23/18 0841  BP: 132/60  Pulse: 68  Resp: 16  Temp: 98 F (36.7 C)  SpO2: 100%  Weight: 120 lb (54.4 kg)     Physical Exam  Constitutional: She is oriented to person, place, and time. She appears well-developed and well-nourished.  HENT:  Head: Atraumatic.  Right Ear: External ear normal.  Left Ear: External ear normal.  Nose: Nose normal.  Mouth/Throat: Oropharynx is clear and moist.  Eyes: Conjunctivae are normal. No scleral icterus.  Neck:  Chronic left thyroid enlargement.  Cardiovascular: Normal rate, regular rhythm, normal heart sounds and intact distal pulses.  Pulmonary/Chest: Effort normal and breath sounds normal.  Abdominal: Soft.   Musculoskeletal: She exhibits no edema.  Neurological: She is alert and oriented to person, place, and time.  Skin: Skin is warm and dry.  Psychiatric: She has a normal mood and affect. Her behavior is normal. Judgment and thought content normal.        Assessment & Plan:     1. Essential (primary) hypertension  - CBC with Differential/Platelet - Comprehensive metabolic panel - TSH  2. Acquired hypothyroidism  - TSH  3. Need for immunization against influenza  - Flu vaccine HIGH DOSE PF (Fluzone High dose)  4. Fatty infiltration of liver   5. Osteoporosis, unspecified osteoporosis type, unspecified pathological fracture presence   6. Leukopenia, unspecified type   7. MCI (mild cognitive impairment) MMSE 27/30 today.      I have done the exam and reviewed the chart and it is accurate to the best of my knowledge. Dentist has been used and  any errors in dictation or transcription are unintentional. Julieanne Manson M.D. [facility]  [name], MD  [facility]

## 2018-02-24 LAB — COMPREHENSIVE METABOLIC PANEL
A/G RATIO: 1.9 (ref 1.2–2.2)
ALT: 25 IU/L (ref 0–32)
AST: 35 IU/L (ref 0–40)
Albumin: 4.1 g/dL (ref 3.5–4.8)
Alkaline Phosphatase: 51 IU/L (ref 39–117)
BILIRUBIN TOTAL: 0.6 mg/dL (ref 0.0–1.2)
BUN/Creatinine Ratio: 19 (ref 12–28)
BUN: 10 mg/dL (ref 8–27)
CALCIUM: 9.5 mg/dL (ref 8.7–10.3)
CHLORIDE: 100 mmol/L (ref 96–106)
CO2: 23 mmol/L (ref 20–29)
Creatinine, Ser: 0.53 mg/dL — ABNORMAL LOW (ref 0.57–1.00)
GFR calc Af Amer: 106 mL/min/{1.73_m2} (ref >59)
GFR calc non Af Amer: 92 mL/min/{1.73_m2} (ref >59)
GLUCOSE: 95 mg/dL (ref 65–99)
Globulin, Total: 2.2 g/dL (ref 1.5–4.5)
POTASSIUM: 4.6 mmol/L (ref 3.5–5.2)
Sodium: 135 mmol/L (ref 134–144)
Total Protein: 6.3 g/dL (ref 6.0–8.5)

## 2018-02-24 LAB — CBC WITH DIFFERENTIAL/PLATELET
BASOS ABS: 0 10*3/uL (ref 0.0–0.2)
Basos: 1 %
EOS (ABSOLUTE): 0.1 10*3/uL (ref 0.0–0.4)
Eos: 2 %
Hematocrit: 39 % (ref 34.0–46.6)
Hemoglobin: 13.5 g/dL (ref 11.1–15.9)
IMMATURE GRANS (ABS): 0 10*3/uL (ref 0.0–0.1)
Immature Granulocytes: 1 %
LYMPHS: 28 %
Lymphocytes Absolute: 0.9 10*3/uL (ref 0.7–3.1)
MCH: 31 pg (ref 26.6–33.0)
MCHC: 34.6 g/dL (ref 31.5–35.7)
MCV: 89 fL (ref 79–97)
MONOCYTES: 12 %
Monocytes Absolute: 0.4 10*3/uL (ref 0.1–0.9)
NEUTROS ABS: 1.7 10*3/uL (ref 1.4–7.0)
NEUTROS PCT: 56 %
PLATELETS: 85 10*3/uL — AB (ref 150–450)
RBC: 4.36 x10E6/uL (ref 3.77–5.28)
RDW: 13.3 % (ref 12.3–15.4)
WBC: 3 10*3/uL — ABNORMAL LOW (ref 3.4–10.8)

## 2018-02-24 LAB — TSH: TSH: 0.79 u[IU]/mL (ref 0.450–4.500)

## 2018-06-04 ENCOUNTER — Other Ambulatory Visit: Payer: Self-pay | Admitting: Family Medicine

## 2018-06-04 DIAGNOSIS — I10 Essential (primary) hypertension: Secondary | ICD-10-CM

## 2018-06-05 NOTE — Telephone Encounter (Signed)
Pharmacy requesting refills. Thanks!  

## 2018-06-14 ENCOUNTER — Ambulatory Visit (INDEPENDENT_AMBULATORY_CARE_PROVIDER_SITE_OTHER): Payer: Medicare Other

## 2018-06-14 VITALS — BP 152/68 | HR 99 | Temp 98.8°F | Ht 60.0 in | Wt 121.2 lb

## 2018-06-14 DIAGNOSIS — Z0000 Encounter for general adult medical examination without abnormal findings: Secondary | ICD-10-CM | POA: Diagnosis not present

## 2018-06-14 NOTE — Progress Notes (Signed)
Subjective:   Lauren Lawrence is a 78 y.o. female who presents for Medicare Annual (Subsequent) preventive examination.  Review of Systems:  N/A  Cardiac Risk Factors include: dyslipidemia;advanced age (>43men, >18 women);hypertension     Objective:     Vitals: BP (!) 152/68 (BP Location: Right Arm)   Pulse 99   Temp 98.8 F (37.1 C) (Oral)   Ht 5' (1.524 m)   Wt 121 lb 3.2 oz (55 kg)   BMI 23.67 kg/m   Body mass index is 23.67 kg/m.  Advanced Directives 06/14/2018 06/08/2017 02/17/2016 07/14/2015 03/10/2015 03/10/2015  Does Patient Have a Medical Advance Directive? Yes No Yes Yes No;Yes No  Type of Estate agent of [place];Living will - - - - -  Does patient want to make changes to medical advance directive? - Yes (MAU/Ambulatory/Procedural Areas - Information given) No - Patient declined - - -  Copy of Healthcare Power of Attorney in Chart? No - copy requested - No - copy requested - - -    Tobacco Social History   Tobacco Use  Smoking Status Never Smoker  Smokeless Tobacco Never Used     Counseling given: Not Answered   Clinical Intake:  Pre-visit preparation completed: Yes  Pain Score: 0-No pain     Nutritional Status: BMI of 19-24  Normal Nutritional Risks: None Diabetes: No  How often do you need to have someone help you when you read instructions, pamphlets, or other written materials from your doctor or pharmacy?: 1 - Never  Interpreter Needed?: No  Information entered by :: [facility], LPN  Past Medical History:  Diagnosis Date  . Depression   . Hyperlipidemia   . Hypertension    History reviewed. No pertinent surgical history. Family History  Problem Relation Age of Onset  . Heart disease Mother   . Breast cancer Mother 61  . Dementia Father   . Hypertension Father   . Heart disease Father   . Gout Father   . Heart disease Brother        MI with CABG X3  . Breast cancer Maternal Aunt 83   Social History    Socioeconomic History  . Marital status: Widowed    Spouse name: Alfredo Bach  . Number of children: 2  . Years of education: 16  . Highest education level: Bachelor's degree (e.g., BA, AB, BS)  Occupational History  . Occupation: retired  Engineer, production  . Financial resource strain: Not hard at all  . Food insecurity:    Worry: Never true    Inability: Never true  . Transportation needs:    Medical: No    Non-medical: No  Tobacco Use  . Smoking status: Never Smoker  . Smokeless tobacco: Never Used  Substance and Sexual Activity  . Alcohol use: No  . Drug use: No  . Sexual activity: Never  Lifestyle  . Physical activity:    Days per week: 0 days    Minutes per session: 0 min  . Stress: Not at all  Relationships  . Social connections:    Talks on phone: Patient refused    Gets together: Patient refused    Attends religious service: Patient refused    Active member of club or organization: Patient refused    Attends meetings of clubs or organizations: Patient refused    Relationship status: Patient refused  Other Topics Concern  . Not on file  Social History Narrative  . Not on file    Outpatient  Encounter Medications as of 06/14/2018  Medication Sig  . amLODipine (NORVASC) 5 MG tablet TAKE 1 TABLET BY MOUTH EVERY DAY  . Ascorbic Acid 500 MG CAPS Take by mouth daily.   . fluticasone (FLONASE) 50 MCG/ACT nasal spray USE 1-2 SQUIRTS EACH NOSTRIL DAILY (Patient taking differently: USE 1-2 SQUIRTS EACH NOSTRIL DAILY as needed)  . hydrochlorothiazide (HYDRODIURIL) 25 MG tablet TAKE 1 TABLET BY MOUTH EVERY DAY AS NEEDED  . levothyroxine (SYNTHROID, LEVOTHROID) 88 MCG tablet TAKE 1 TABLET BY MOUTH EVERY DAY  . losartan (COZAAR) 100 MG tablet TAKE 1 TABLET BY MOUTH EVERY DAY  . Magnesium 400 MG CAPS Take by mouth daily.   . metoprolol succinate (TOPROL-XL) 50 MG 24 hr tablet TAKE 1 TABLET BY MOUTH DAILY. TAKE WITH OR IMMEDIATELY FOLLOWING A MEAL.*PATIENT NEEDS APPT*  . Milk  Thistle-Dand-Fennel-Licor (MILK THISTLE XTRA) CAPS Take by mouth.   . raloxifene (EVISTA) 60 MG tablet TAKE 1 TABLET BY MOUTH ONCE A DAY (Patient taking differently: TAKE 1 TABLET BY MOUTH ONCE A DAY (takes a couple times a week))  . traZODone (DESYREL) 50 MG tablet Take 0.5-1 tablets (25-50 mg total) by mouth at bedtime as needed for sleep.  . vitamin E 400 UNIT capsule Take 400 Units by mouth daily.   . Cholecalciferol 1000 UNITS TBDP Take by mouth daily.    No facility-administered encounter medications on file as of 06/14/2018.     Activities of Daily Living In your present state of health, do you have any difficulty performing the following activities: 06/14/2018  Hearing? N  Vision? N  Comment Wears eye glasses daily.   Difficulty concentrating or making decisions? Y  Walking or climbing stairs? N  Dressing or bathing? N  Doing errands, shopping? N  Preparing Food and eating ? N  Using the Toilet? N  In the past six months, have you accidently leaked urine? N  Do you have problems with loss of bowel control? N  Managing your Medications? N  Managing your Finances? N  Housekeeping or managing your Housekeeping? N  Some recent data might be hidden    Patient Care Team: [name], MD as PCP - General (Family Medicine) Irene Limbo., MD as Consulting Physician (Ophthalmology)    Assessment:   This is a routine wellness examination for Lauren Lawrence.  Exercise Activities and Dietary recommendations Current Exercise Habits: The patient does not participate in regular exercise at present, Exercise limited by: None identified  Goals    . DIET - INCREASE WATER INTAKE     Recommend increasing water intake to 4-6 glasses a day.     . Increase water intake     Starting 02/17/16, I will increase my water intake to 4 glasses a day.       Fall Risk Fall Risk  06/14/2018 06/08/2017 02/17/2016 01/07/2016 09/16/2015  Falls in the past year? 0 No No No No   FALL RISK  PREVENTION PERTAINING TO THE HOME: Any stairs in or around the home? No  If so, do they handrails? N/A  Home free of loose throw rugs in walkways, pet beds, electrical cords, etc? Yes  Adequate lighting in your home to reduce risk of falls? Yes   ASSISTIVE DEVICES UTILIZED TO PREVENT FALLS:  Life alert? No  Use of a cane, walker or w/c? No  Grab bars in the bathroom? Yes  Shower chair or bench in shower? No  Elevated toilet seat or a handicapped toilet? Yes  TIMED UP AND GO:  Was the test performed? No .     Depression Screen PHQ 2/9 Scores 06/14/2018 06/14/2018 06/08/2017 06/08/2017  PHQ - 2 Score 0 0 0 0  PHQ- 9 Score - 0 1 -     Cognitive Function: Declined today.      6CIT Screen 06/08/2017 02/17/2016 01/07/2016  What Year? 0 points 0 points 0 points  What month? 0 points 0 points 0 points  What time? 0 points 0 points 0 points  Count back from 20 0 points 0 points 0 points  Months in reverse 0 points 0 points 2 points  Repeat phrase 0 points 0 points 0 points  Total Score 0 0 2    Immunization History  Administered Date(s) Administered  . Hepatitis B, adult 03/04/2011, 04/08/2011  . Influenza Split 02/24/2012  . Influenza, High Dose Seasonal PF 02/19/2014, 02/04/2015, 01/07/2016, 06/08/2017, 02/23/2018  . Pneumococcal Conjugate-13 05/21/2014  . Pneumococcal Polysaccharide-23 06/29/2011  . Tdap 01/03/2007    Qualifies for Shingles Vaccine? Yes . Due for Shingrix. Education has been provided regarding the importance of this vaccine. Pt has been advised to call insurance company to determine out of pocket expense. Advised may also receive vaccine at local pharmacy or Health Dept. Verbalized acceptance and understanding.  Tdap: Although this vaccine is not a covered service during a Wellness Exam, does the patient still wish to receive this vaccine today?  No .  Education has been provided regarding the importance of this vaccine. Advised may receive this vaccine at  local pharmacy or Health Dept. Aware to provide a copy of the vaccination record if obtained from local pharmacy or Health Dept. Verbalized acceptance and understanding.  Flu Vaccine: Up to date  Pneumococcal Vaccine: Up to date   Screening Tests Health Maintenance  Topic Date Due  . TETANUS/TDAP  01/02/2017  . DEXA SCAN  08/18/2019  . INFLUENZA VACCINE  Completed  . PNA vac Low Risk Adult  Completed    Cancer Screenings:  Colorectal Screening: No longer required.   Mammogram: No longer required.   Bone Density: Completed 08/17/17. Results reflect OSTEOPOROSIS. Repeat every 2 years.   Lung Cancer Screening: (Low Dose CT Chest recommended if Age 8-80 years, 30 pack-year currently smoking OR have quit w/in 15years.) does not qualify.    Additional Screening:  Vision Screening: Recommended annual ophthalmology exams for early detection of glaucoma and other disorders of the eye.  Dental Screening: Recommended annual dental exams for proper oral hygiene  Community Resource Referral:  CRR required this visit?  No       Plan:  I have personally reviewed and addressed the Medicare Annual Wellness questionnaire and have noted the following in the patient's chart:  A. Medical and social history B. Use of alcohol, tobacco or illicit drugs  C. Current medications and supplements D. Functional ability and status E.  Nutritional status F.  Physical activity G. Advance directives H. List of other physicians I.  Hospitalizations, surgeries, and ER visits in previous 12 months J.  Vitals K. Screenings such as hearing and vision if needed, cognitive and depression L. Referrals and appointments - none  In addition, I have reviewed and discussed with patient certain preventive protocols, quality metrics, and best practice recommendations. A written personalized care plan for preventive services as well as general preventive health recommendations were provided to patient.  See  attached scanned questionnaire for additional information.   Signed,  Hyacinth Meeker, LPN Nurse Health Advisor  Nurse Recommendations: Pt declined the tetanus vaccine today. Pt to monitor BP readings at home and contact office if she notices a pattern of elevated Bps or MI s/s. Pt was rushing today and believes that is why it was elevated.

## 2018-06-14 NOTE — Patient Instructions (Signed)
[name] , Thank you for taking time to come for your Medicare Wellness Visit. I appreciate your ongoing commitment to your health goals. Please review the following plan we discussed and let me know if I can assist you in the future.   Screening recommendations/referrals: Colonoscopy: No longer required.  Mammogram: No longer required.  Bone Density: Up to date, due 08/2019 Recommended yearly ophthalmology/optometry visit for glaucoma screening and checkup Recommended yearly dental visit for hygiene and checkup  Vaccinations: Influenza vaccine: Up to date Pneumococcal vaccine: Completed series Tdap vaccine: Pt declines today.  Shingles vaccine: Pt declines today.     Advanced directives: Please bring a copy of your POA (Power of Attorney) and/or Living Will to your next appointment.   Conditions/risks identified: Continue trying to increase water intake to 6-8 8oz glasses a day.   Next appointment: 08/10/18 @ 9:40 AM with Dr Sullivan Lone   Preventive Care 65 Years and Older, Female Preventive care refers to lifestyle choices and visits with your health care provider that can promote health and wellness. What does preventive care include?  A yearly physical exam. This is also called an annual well check.  Dental exams once or twice a year.  Routine eye exams. Ask your health care provider how often you should have your eyes checked.  Personal lifestyle choices, including:  Daily care of your teeth and gums.  Regular physical activity.  Eating a healthy diet.  Avoiding tobacco and drug use.  Limiting alcohol use.  Practicing safe sex.  Taking low-dose aspirin every day.  Taking vitamin and mineral supplements as recommended by your health care provider. What happens during an annual well check? The services and screenings done by your health care provider during your annual well check will depend on your age, overall health, lifestyle risk factors, and family history of  disease. Counseling  Your health care provider may ask you questions about your:  Alcohol use.  Tobacco use.  Drug use.  Emotional well-being.  Home and relationship well-being.  Sexual activity.  Eating habits.  History of falls.  Memory and ability to understand (cognition).  Work and work Astronomer.  Reproductive health. Screening  You may have the following tests or measurements:  Height, weight, and BMI.  Blood pressure.  Lipid and cholesterol levels. These may be checked every 5 years, or more frequently if you are over 23 years old.  Skin check.  Lung cancer screening. You may have this screening every year starting at age 74 if you have a 30-pack-year history of smoking and currently smoke or have quit within the past 15 years.  Fecal occult blood test (FOBT) of the stool. You may have this test every year starting at age 54.  Flexible sigmoidoscopy or colonoscopy. You may have a sigmoidoscopy every 5 years or a colonoscopy every 10 years starting at age 15.  Hepatitis C blood test.  Hepatitis B blood test.  Sexually transmitted disease (STD) testing.  Diabetes screening. This is done by checking your blood sugar (glucose) after you have not eaten for a while (fasting). You may have this done every 1-3 years.  Bone density scan. This is done to screen for osteoporosis. You may have this done starting at age 10.  Mammogram. This may be done every 1-2 years. Talk to your health care provider about how often you should have regular mammograms. Talk with your health care provider about your test results, treatment options, and if necessary, the need for more tests. Vaccines  Your health care provider may recommend certain vaccines, such as:  Influenza vaccine. This is recommended every year.  Tetanus, diphtheria, and acellular pertussis (Tdap, Td) vaccine. You may need a Td booster every 10 years.  Zoster vaccine. You may need this after age  74.  Pneumococcal 13-valent conjugate (PCV13) vaccine. One dose is recommended after age 19.  Pneumococcal polysaccharide (PPSV23) vaccine. One dose is recommended after age 2. Talk to your health care provider about which screenings and vaccines you need and how often you need them. This information is not intended to replace advice given to you by your health care provider. Make sure you discuss any questions you have with your health care provider. Document Released: 05/02/2015 Document Revised: 12/24/2015 Document Reviewed: 02/04/2015 Elsevier Interactive Patient Education  2017 [employer] Prevention in the Home Falls can cause injuries. They can happen to people of all ages. There are many things you can do to make your home safe and to help prevent falls. What can I do on the outside of my home?  Regularly fix the edges of walkways and driveways and fix any cracks.  Remove anything that might make you trip as you walk through a door, such as a raised step or threshold.  Trim any bushes or trees on the path to your home.  Use bright outdoor lighting.  Clear any walking paths of anything that might make someone trip, such as rocks or tools.  Regularly check to see if handrails are loose or broken. Make sure that both sides of any steps have handrails.  Any raised decks and porches should have guardrails on the edges.  Have any leaves, snow, or ice cleared regularly.  Use sand or salt on walking paths during winter.  Clean up any spills in your garage right away. This includes oil or grease spills. What can I do in the bathroom?  Use night lights.  Install grab bars by the toilet and in the tub and shower. Do not use towel bars as grab bars.  Use non-skid mats or decals in the tub or shower.  If you need to sit down in the shower, use a plastic, non-slip stool.  Keep the floor dry. Clean up any water that spills on the floor as soon as it happens.  Remove  soap buildup in the tub or shower regularly.  Attach bath mats securely with double-sided non-slip rug tape.  Do not have throw rugs and other things on the floor that can make you trip. What can I do in the bedroom?  Use night lights.  Make sure that you have a light by your bed that is easy to reach.  Do not use any sheets or blankets that are too big for your bed. They should not hang down onto the floor.  Have a firm chair that has side arms. You can use this for support while you get dressed.  Do not have throw rugs and other things on the floor that can make you trip. What can I do in the kitchen?  Clean up any spills right away.  Avoid walking on wet floors.  Keep items that you use a lot in easy-to-reach places.  If you need to reach something above you, use a strong step stool that has a grab bar.  Keep electrical cords out of the way.  Do not use floor polish or wax that makes floors slippery. If you must use wax, use non-skid floor wax.  Do  not have throw rugs and other things on the floor that can make you trip. What can I do with my stairs?  Do not leave any items on the stairs.  Make sure that there are handrails on both sides of the stairs and use them. Fix handrails that are broken or loose. Make sure that handrails are as long as the stairways.  Check any carpeting to make sure that it is firmly attached to the stairs. Fix any carpet that is loose or worn.  Avoid having throw rugs at the top or bottom of the stairs. If you do have throw rugs, attach them to the floor with carpet tape.  Make sure that you have a light switch at the top of the stairs and the bottom of the stairs. If you do not have them, ask someone to add them for you. What else can I do to help prevent falls?  Wear shoes that:  Do not have high heels.  Have rubber bottoms.  Are comfortable and fit you well.  Are closed at the toe. Do not wear sandals.  If you use a  stepladder:  Make sure that it is fully opened. Do not climb a closed stepladder.  Make sure that both sides of the stepladder are locked into place.  Ask someone to hold it for you, if possible.  Clearly mark and make sure that you can see:  Any grab bars or handrails.  First and last steps.  Where the edge of each step is.  Use tools that help you move around (mobility aids) if they are needed. These include:  Canes.  Walkers.  Scooters.  Crutches.  Turn on the lights when you go into a dark area. Replace any light bulbs as soon as they burn out.  Set up your furniture so you have a clear path. Avoid moving your furniture around.  If any of your floors are uneven, fix them.  If there are any pets around you, be aware of where they are.  Review your medicines with your doctor. Some medicines can make you feel dizzy. This can increase your chance of falling. Ask your doctor what other things that you can do to help prevent falls. This information is not intended to replace advice given to you by your health care provider. Make sure you discuss any questions you have with your health care provider. Document Released: 01/30/2009 Document Revised: 09/11/2015 Document Reviewed: 05/10/2014 Elsevier Interactive Patient Education  2017 [employer].

## 2018-06-30 ENCOUNTER — Other Ambulatory Visit: Payer: Self-pay | Admitting: Family Medicine

## 2018-06-30 DIAGNOSIS — I10 Essential (primary) hypertension: Secondary | ICD-10-CM

## 2018-06-30 MED ORDER — METOPROLOL SUCCINATE ER 50 MG PO TB24: ORAL_TABLET | ORAL | 3 refills | Status: DC

## 2018-06-30 NOTE — Telephone Encounter (Signed)
[facility] faxed refill request for the following medications:  metoprolol succinate (TOPROL-XL) 50 MG 24 hr tablet  90 day supply  Please advise. Thanks TNP

## 2018-08-10 ENCOUNTER — Ambulatory Visit: Payer: Self-pay | Admitting: Family Medicine

## 2018-08-10 ENCOUNTER — Encounter: Payer: Medicare Other | Admitting: Family Medicine

## 2018-09-18 ENCOUNTER — Other Ambulatory Visit: Payer: Self-pay | Admitting: Family Medicine

## 2018-09-18 DIAGNOSIS — I10 Essential (primary) hypertension: Secondary | ICD-10-CM

## 2018-11-04 ENCOUNTER — Other Ambulatory Visit: Payer: Self-pay | Admitting: Family Medicine

## 2018-11-16 ENCOUNTER — Other Ambulatory Visit: Payer: Self-pay | Admitting: Family Medicine

## 2018-11-16 DIAGNOSIS — Z1231 Encounter for screening mammogram for malignant neoplasm of breast: Secondary | ICD-10-CM

## 2018-11-27 ENCOUNTER — Ambulatory Visit
Admission: RE | Admit: 2018-11-27 | Discharge: 2018-11-27 | Disposition: A | Discharge status: Home / Self Care | Payer: Medicare Other | Source: Ambulatory Visit | Attending: Family Medicine | Admitting: Family Medicine

## 2018-11-27 ENCOUNTER — Other Ambulatory Visit: Payer: Self-pay

## 2018-11-27 ENCOUNTER — Encounter: Payer: Medicare Other | Admitting: Family Medicine

## 2018-11-27 DIAGNOSIS — Z1231 Encounter for screening mammogram for malignant neoplasm of breast: Secondary | ICD-10-CM | POA: Insufficient documentation

## 2018-12-12 ENCOUNTER — Encounter: Payer: Self-pay | Admitting: Family Medicine

## 2018-12-12 ENCOUNTER — Ambulatory Visit (INDEPENDENT_AMBULATORY_CARE_PROVIDER_SITE_OTHER): Payer: Medicare Other | Admitting: Family Medicine

## 2018-12-12 ENCOUNTER — Other Ambulatory Visit: Payer: Self-pay

## 2018-12-12 VITALS — BP 118/72 | HR 68 | Temp 97.3°F | Resp 15 | Ht 60.0 in | Wt 121.0 lb

## 2018-12-12 DIAGNOSIS — L309 Dermatitis, unspecified: Secondary | ICD-10-CM | POA: Diagnosis not present

## 2018-12-12 DIAGNOSIS — Z0000 Encounter for general adult medical examination without abnormal findings: Secondary | ICD-10-CM

## 2018-12-12 DIAGNOSIS — Z23 Encounter for immunization: Secondary | ICD-10-CM

## 2018-12-12 MED ORDER — TRIAMCINOLONE ACETONIDE 0.5 % EX CREA: 1.0000 "application " | TOPICAL_CREAM | Freq: Every day | CUTANEOUS | 0 refills | Status: DC

## 2018-12-12 NOTE — Progress Notes (Signed)
Patient: Lauren Lawrence, Female    DOB: 1941/02/11, 78 y.o.   MRN: [id number] Visit Date: 12/12/2018  Today's Provider: Megan Mansichard  Jr, MD   Chief Complaint  Patient presents with  . Annual Exam   Subjective:     Complete Physical Lauren Lawrence is a 78 y.o. female. She feels well, patient would like to discuss skin changes and swelling of her right foot. Patient reports that she believes that she may have a rash on her right leg that has been present for the past 2 months or more. Patient reports that she has been applying hydrocortisone PRN for relief. She reports she is not exercising. She reports she is sleeping fairly well.  -----------------------------------------------------------  Last reported Colonoscopy- 06/19/2013 Mammogram-11/27/2018  Review of Systems  Constitutional: Positive for activity change.  Eyes: Negative.   Respiratory: Negative.   Cardiovascular: Positive for leg swelling.  Gastrointestinal: Negative.   Endocrine: Negative.   Genitourinary: Negative.   Musculoskeletal: Negative.   Skin: Positive for rash.  Allergic/Immunologic: Negative.   Neurological: Negative.   Hematological: Bruises/bleeds easily.  Psychiatric/Behavioral: Positive for confusion and decreased concentration.    Social History   Socioeconomic History  . Marital status: Widowed    Spouse name: Alfredo BachCecil  . Number of children: 2  . Years of education: 16  . Highest education level: Bachelor's degree (e.g., BA, AB, BS)  Occupational History  . Occupation: retired  Engineer, productionocial Needs  . Financial resource strain: Not hard at all  . Food insecurity    Worry: Never true    Inability: Never true  . Transportation needs    Medical: No    Non-medical: No  Tobacco Use  . Smoking status: Never Smoker  . Smokeless tobacco: Never Used  Substance and Sexual Activity  . Alcohol use: No  . Drug use: No  . Sexual activity: Never  Lifestyle  . Physical activity    Days per week:  0 days    Minutes per session: 0 min  . Stress: Not at all  Relationships  . Social Musicianconnections    Talks on phone: Patient refused    Gets together: Patient refused    Attends religious service: Patient refused    Active member of club or organization: Patient refused    Attends meetings of clubs or organizations: Patient refused    Relationship status: Patient refused  . Intimate partner violence    Fear of current or ex partner: Patient refused    Emotionally abused: Patient refused    Physically abused: Patient refused    Forced sexual activity: Patient refused  Other Topics Concern  . Not on file  Social History Narrative  . Not on file    Past Medical History:  Diagnosis Date  . Depression   . Hyperlipidemia   . Hypertension      Patient Active Problem List   Diagnosis Date Noted  . Allergic rhinitis 10/17/2014  . Cyst of thyroid 10/17/2014  . Clinical depression 10/17/2014  . Essential (primary) hypertension 10/17/2014  . Fatty infiltration of liver 10/17/2014  . Combined fat and carbohydrate induced hyperlipemia 10/17/2014  . Acquired hypothyroidism 10/17/2014  . Decreased leukocytes 10/17/2014  . OP (osteoporosis) 10/17/2014  . Enlargement of spleen 10/17/2014  . Phlebectasia 10/17/2014  . Avitaminosis D 10/17/2014  . HLD (hyperlipidemia) 07/25/2014  . History of hypertension 07/25/2014    History reviewed. No pertinent surgical history.  Her family history includes Breast cancer (age of  onset: 1) in her mother; Breast cancer (age of onset: 37) in her maternal aunt; Dementia in her father; Gout in her father; Heart disease in her brother, father, and mother; Hypertension in her father.   Current Outpatient Medications:  .  amLODipine (NORVASC) 5 MG tablet, TAKE 1 TABLET BY MOUTH EVERY DAY, Disp: 90 tablet, Rfl: 3 .  Ascorbic Acid 500 MG CAPS, Take by mouth daily. , Disp: , Rfl:  .  Cholecalciferol 1000 UNITS TBDP, Take by mouth daily. , Disp: , Rfl:  .   fluticasone (FLONASE) 50 MCG/ACT nasal spray, USE 1-2 SQUIRTS EACH NOSTRIL DAILY (Patient taking differently: USE 1-2 SQUIRTS EACH NOSTRIL DAILY as needed), Disp: 16 g, Rfl: 12 .  hydrochlorothiazide (HYDRODIURIL) 25 MG tablet, TAKE 1 TABLET BY MOUTH EVERY DAY AS NEEDED, Disp: 30 tablet, Rfl: 12 .  levothyroxine (SYNTHROID) 88 MCG tablet, TAKE 1 TABLET BY MOUTH EVERY DAY, Disp: 90 tablet, Rfl: 2 .  losartan (COZAAR) 100 MG tablet, TAKE 1 TABLET BY MOUTH EVERY DAY, Disp: 90 tablet, Rfl: 3 .  Magnesium 400 MG CAPS, Take by mouth daily. , Disp: , Rfl:  .  metoprolol succinate (TOPROL-XL) 50 MG 24 hr tablet, TAKE 1 TABLET BY MOUTH DAILY. TAKE WITH OR IMMEDIATELY FOLLOWING A MEAl, Disp: 90 tablet, Rfl: 3 .  Milk Thistle-Dand-Fennel-Licor (MILK THISTLE XTRA) CAPS, Take by mouth. , Disp: , Rfl:  .  raloxifene (EVISTA) 60 MG tablet, TAKE 1 TABLET BY MOUTH ONCE A DAY (Patient taking differently: TAKE 1 TABLET BY MOUTH ONCE A DAY (takes a couple times a week)), Disp: 90 tablet, Rfl: 3 .  traZODone (DESYREL) 50 MG tablet, Take 0.5-1 tablets (25-50 mg total) by mouth at bedtime as needed for sleep., Disp: 90 tablet, Rfl: 1 .  vitamin E 400 UNIT capsule, Take 400 Units by mouth daily. , Disp: , Rfl:   Patient Care Team: [name], MD as PCP - General (Family Medicine) Irene Limbo., MD as Consulting Physician (Ophthalmology)     Objective:    Vitals: BP 118/72   Pulse 68   Temp (!) 97.3 F (36.3 C) (Oral)   Resp 15   Ht 5' (1.524 m)   Wt 121 lb (54.9 kg)   SpO2 98%   BMI 23.63 kg/m   Physical Exam Vitals signs reviewed.  Constitutional:      Appearance: She is well-developed.  HENT:     Head: Normocephalic and atraumatic.     Right Ear: External ear normal.     Left Ear: External ear normal.     Nose: Nose normal.  Eyes:     General: No scleral icterus.    Conjunctiva/sclera: Conjunctivae normal.     Pupils: Pupils are equal, round, and reactive to light.  Neck:      Musculoskeletal: Normal range of motion and neck supple.     Thyroid: No thyromegaly.     Comments: Small stable nodule isthmus of thyroid. Cardiovascular:     Rate and Rhythm: Normal rate and regular rhythm.     Heart sounds: Normal heart sounds.  Pulmonary:     Effort: Pulmonary effort is normal.     Breath sounds: Normal breath sounds.  Abdominal:     General: Bowel sounds are normal.     Palpations: Abdomen is soft.  Musculoskeletal: Normal range of motion.  Skin:    General: Skin is warm and dry.     Comments: Right lower leg dermatitis about the size of  a lemon  Neurological:     Mental Status: She is alert and oriented to person, place, and time.     Deep Tendon Reflexes: Reflexes are normal and symmetric.  Psychiatric:        Mood and Affect: Mood normal.        Behavior: Behavior normal.        Thought Content: Thought content normal.        Judgment: Judgment normal.     Activities of Daily Living In your present state of health, do you have any difficulty performing the following activities: 12/12/2018 06/14/2018  Hearing? N N  Vision? N N  Comment - Wears eye glasses daily.   Difficulty concentrating or making decisions? Tempie Donning  Walking or climbing stairs? N N  Dressing or bathing? N N  Doing errands, shopping? N N  Preparing Food and eating ? - N  Using the Toilet? - N  In the past six months, have you accidently leaked urine? - N  Do you have problems with loss of bowel control? - N  Managing your Medications? - N  Managing your Finances? - N  Housekeeping or managing your Housekeeping? - N  Some recent data might be hidden    Fall Risk Assessment Fall Risk  12/12/2018 06/14/2018 06/08/2017 02/17/2016 01/07/2016  Falls in the past year? 0 0 No No No     Depression Screen PHQ 2/9 Scores 12/12/2018 12/12/2018 06/14/2018 06/14/2018  PHQ - 2 Score 0 0 0 0  PHQ- 9 Score 1 - - 0    6CIT Screen 06/08/2017  What Year? 0 points  What month? 0 points  What time? 0  points  Count back from 20 0 points  Months in reverse 0 points  Repeat phrase 0 points  Total Score 0       Assessment & Plan:    Annual Physical Reviewed patient's Family Medical History Reviewed and updated list of patient's medical providers Assessment of cognitive impairment was done Assessed patient's functional ability Established a written schedule for health screening [place] Completed and Reviewed  Exercise Activities and Dietary recommendations Goals    . DIET - INCREASE WATER INTAKE     Recommend increasing water intake to 4-6 glasses a day.     . Increase water intake     Starting 02/17/16, I will increase my water intake to 4 glasses a day.       Immunization History  Administered Date(s) Administered  . Hepatitis B, adult 03/04/2011, 04/08/2011  . Influenza Split 02/24/2012  . Influenza, High Dose Seasonal PF 02/19/2014, 02/04/2015, 01/07/2016, 06/08/2017, 02/23/2018  . Pneumococcal Conjugate-13 05/21/2014  . Pneumococcal Polysaccharide-23 06/29/2011  . Tdap 01/03/2007    Health Maintenance  Topic Date Due  . Samul Dada  01/02/2017  . INFLUENZA VACCINE  11/18/2018  . DEXA SCAN  08/18/2019  . PNA vac Low Risk Adult  Completed     Discussed health benefits of physical activity, and encouraged her to engage in regular exercise appropriate for her age and condition.  1. Annual physical exam  - Lipid panel - Comprehensive metabolic panel - CBC with Differential/Platelet - TSH - Flu Vaccine QUAD High Dose(Fluad)  2. Dermatitis Triamcinelone cream and refer to dermatology    ------------------------------------------------------------------------------------------------------------    [name], MD  [facility] Clint Bolder as a scribe for [name], MD.,have documented all relevant documentation on the behalf of [name],  MD,as  directed by  Megan Mansichard  Jr, MD while in the presence of Megan Mansichard  Jr, MD.

## 2018-12-13 LAB — CBC WITH DIFFERENTIAL/PLATELET
Basophils Absolute: 0 10*3/uL (ref 0.0–0.2)
Basos: 1 %
EOS (ABSOLUTE): 0.1 10*3/uL (ref 0.0–0.4)
Eos: 3 %
Hematocrit: 39.8 % (ref 34.0–46.6)
Hemoglobin: 13.3 g/dL (ref 11.1–15.9)
Immature Grans (Abs): 0 10*3/uL (ref 0.0–0.1)
Immature Granulocytes: 0 %
Lymphocytes Absolute: 1 10*3/uL (ref 0.7–3.1)
Lymphs: 24 %
MCH: 30.7 pg (ref 26.6–33.0)
MCHC: 33.4 g/dL (ref 31.5–35.7)
MCV: 92 fL (ref 79–97)
Monocytes Absolute: 0.6 10*3/uL (ref 0.1–0.9)
Monocytes: 14 %
Neutrophils Absolute: 2.4 10*3/uL (ref 1.4–7.0)
Neutrophils: 58 %
Platelets: 93 10*3/uL — CL (ref 150–450)
RBC: 4.33 x10E6/uL (ref 3.77–5.28)
RDW: 13.3 % (ref 11.7–15.4)
WBC: 4.2 10*3/uL (ref 3.4–10.8)

## 2018-12-13 LAB — TSH: TSH: 2.41 u[IU]/mL (ref 0.450–4.500)

## 2018-12-13 LAB — COMPREHENSIVE METABOLIC PANEL
ALT: 28 IU/L (ref 0–32)
AST: 44 IU/L — ABNORMAL HIGH (ref 0–40)
Albumin/Globulin Ratio: 1.9 (ref 1.2–2.2)
Albumin: 4.3 g/dL (ref 3.7–4.7)
Alkaline Phosphatase: 67 IU/L (ref 39–117)
BUN/Creatinine Ratio: 22 (ref 12–28)
BUN: 13 mg/dL (ref 8–27)
Bilirubin Total: 0.9 mg/dL (ref 0.0–1.2)
CO2: 23 mmol/L (ref 20–29)
Calcium: 9.4 mg/dL (ref 8.7–10.3)
Chloride: 99 mmol/L (ref 96–106)
Creatinine, Ser: 0.59 mg/dL (ref 0.57–1.00)
GFR calc Af Amer: 102 mL/min/{1.73_m2} (ref >59)
GFR calc non Af Amer: 89 mL/min/{1.73_m2} (ref >59)
Globulin, Total: 2.3 g/dL (ref 1.5–4.5)
Glucose: 77 mg/dL (ref 65–99)
Potassium: 4.3 mmol/L (ref 3.5–5.2)
Sodium: 137 mmol/L (ref 134–144)
Total Protein: 6.6 g/dL (ref 6.0–8.5)

## 2018-12-13 LAB — LIPID PANEL
Chol/HDL Ratio: 2.6 ratio (ref 0.0–4.4)
Cholesterol, Total: 149 mg/dL (ref 100–199)
HDL: 58 mg/dL (ref >39)
LDL Calculated: 75 mg/dL (ref 0–99)
Triglycerides: 82 mg/dL (ref 0–149)
VLDL Cholesterol Cal: 16 mg/dL (ref 5–40)

## 2019-05-24 DIAGNOSIS — L900 Lichen sclerosus et atrophicus: Secondary | ICD-10-CM | POA: Diagnosis not present

## 2019-05-24 DIAGNOSIS — L570 Actinic keratosis: Secondary | ICD-10-CM | POA: Diagnosis not present

## 2019-05-24 DIAGNOSIS — L309 Dermatitis, unspecified: Secondary | ICD-10-CM | POA: Diagnosis not present

## 2019-05-24 DIAGNOSIS — X32XXXA Exposure to sunlight, initial encounter: Secondary | ICD-10-CM | POA: Diagnosis not present

## 2019-06-18 NOTE — Progress Notes (Signed)
Subjective:   Lauren Lawrence is a 79 y.o. female who presents for Medicare Annual (Subsequent) preventive examination.    This visit is being conducted through telemedicine due to the COVID-19 pandemic. This patient has given me verbal consent via doximity to conduct this visit, patient states they are participating from their home address. Some vital signs may be absent or patient reported.    Patient identification: identified by name, DOB, and current address  Review of Systems:  N/A  Cardiac Risk Factors include: advanced age (>38men, >55 women);hypertension     Objective:     Vitals: There were no vitals taken for this visit.  There is no height or weight on file to calculate BMI. Unable to obtain vitals due to visit being conducted via telephonically.   Advanced Directives 06/19/2019 06/14/2018 06/08/2017 02/17/2016 07/14/2015 03/10/2015 03/10/2015  Does Patient Have a Medical Advance Directive? Yes Yes No Yes Yes No;Yes No  Type of Estate agent of [place];Living will Healthcare Power of [place];Living will - - - - -  Does patient want to make changes to medical advance directive? - - Yes (MAU/Ambulatory/Procedural Areas - Information given) No - Patient declined - - -  Copy of Healthcare Power of Attorney in Chart? No - copy requested No - copy requested - No - copy requested - - -    Tobacco Social History   Tobacco Use  Smoking Status Never Smoker  Smokeless Tobacco Never Used     Counseling given: Not Answered   Clinical Intake:  Pre-visit preparation completed: Yes  Pain : No/denies pain Pain Score: 0-No pain     Nutritional Risks: None Diabetes: No  How often do you need to have someone help you when you read instructions, pamphlets, or other written materials from your doctor or pharmacy?: 1 - Never  Interpreter Needed?: No  Information entered by :: [facility], LPN  Past Medical History:  Diagnosis Date  . Depression   .  Hyperlipidemia   . Hypertension    History reviewed. No pertinent surgical history. Family History  Problem Relation Age of Onset  . Heart disease Mother   . Breast cancer Mother 35  . Dementia Father   . Hypertension Father   . Heart disease Father   . Gout Father   . Heart disease Brother        MI with CABG X3  . Breast cancer Maternal Aunt 34   Social History   Socioeconomic History  . Marital status: Widowed    Spouse name: Alfredo Bach  . Number of children: 2  . Years of education: 16  . Highest education level: Bachelor's degree (e.g., BA, AB, BS)  Occupational History  . Occupation: retired  Tobacco Use  . Smoking status: Never Smoker  . Smokeless tobacco: Never Used  Substance and Sexual Activity  . Alcohol use: No  . Drug use: No  . Sexual activity: Never  Other Topics Concern  . Not on file  Social History Narrative  . Not on file   Social Determinants of Health   Financial Resource Strain: Low Risk   . Difficulty of Paying Living Expenses: Not hard at all  Food Insecurity: No Food Insecurity  . Worried About Programme researcher, broadcasting/film/video in the Last Year: Never true  . Ran Out of Food in the Last Year: Never true  Transportation Needs: No Transportation Needs  . Lack of Transportation (Medical): No  . Lack of Transportation (Non-Medical): No  Physical Activity:  Inactive  . Days of Exercise per Week: 0 days  . Minutes of Exercise per Session: 0 min  Stress: No Stress Concern Present  . Feeling of Stress : Only a little  Social Connections: Slightly Isolated  . Frequency of Communication with Friends and Family: More than three times a week  . Frequency of Social Gatherings with Friends and Family: More than three times a week  . Attends Religious Services: More than 4 times per year  . Active Member of Clubs or Organizations: Yes  . Attends Banker Meetings: More than 4 times per year  . Marital Status: Widowed    Outpatient Encounter Medications  as of 06/19/2019  Medication Sig  . amLODipine (NORVASC) 5 MG tablet TAKE 1 TABLET BY MOUTH EVERY DAY  . Ascorbic Acid 500 MG CAPS Take by mouth daily.   . Cholecalciferol 1000 UNITS TBDP Take by mouth daily.   . Coenzyme Q10 10 MG capsule Take 10 mg by mouth daily.  . fluticasone (FLONASE) 50 MCG/ACT nasal spray USE 1-2 SQUIRTS EACH NOSTRIL DAILY (Patient taking differently: USE 1-2 SQUIRTS EACH NOSTRIL DAILY as needed)  . levothyroxine (SYNTHROID) 88 MCG tablet TAKE 1 TABLET BY MOUTH EVERY DAY  . losartan (COZAAR) 100 MG tablet TAKE 1 TABLET BY MOUTH EVERY DAY  . Magnesium 400 MG CAPS Take by mouth daily.   . metoprolol succinate (TOPROL-XL) 50 MG 24 hr tablet TAKE 1 TABLET BY MOUTH DAILY. TAKE WITH OR IMMEDIATELY FOLLOWING A MEAl  . Milk Thistle-Dand-Fennel-Licor (MILK THISTLE XTRA) CAPS Take by mouth daily.   . raloxifene (EVISTA) 60 MG tablet TAKE 1 TABLET BY MOUTH ONCE A DAY (Patient taking differently: TAKE 1 TABLET BY MOUTH ONCE A DAY (takes a couple times a week))  . traZODone (DESYREL) 50 MG tablet Take 0.5-1 tablets (25-50 mg total) by mouth at bedtime as needed for sleep.  Marland Kitchen triamcinolone cream (KENALOG) 0.5 % Apply 1 application topically daily. (Patient taking differently: Apply 1 application topically daily. As needed)  . vitamin E 400 UNIT capsule Take 400 Units by mouth daily.   . hydrochlorothiazide (HYDRODIURIL) 25 MG tablet TAKE 1 TABLET BY MOUTH EVERY DAY AS NEEDED (Patient not taking: Reported on 06/19/2019)   No facility-administered encounter medications on file as of 06/19/2019.    Activities of Daily Living In your present state of health, do you have any difficulty performing the following activities: 06/19/2019 12/12/2018  Hearing? N N  Vision? N N  Difficulty concentrating or making decisions? N Y  Walking or climbing stairs? N N  Dressing or bathing? N N  Doing errands, shopping? N N  Preparing Food and eating ? N -  Using the Toilet? N -  In the past six months,  have you accidently leaked urine? N -  Do you have problems with loss of bowel control? N -  Managing your Medications? N -  Managing your Finances? N -  Housekeeping or managing your Housekeeping? N -  Some recent data might be hidden    Patient Care Team: [name], MD as PCP - General (Family Medicine) Irene Limbo., MD as Consulting Physician (Ophthalmology) Debbrah Alar, MD (Dermatology)    Assessment:   This is a routine wellness examination for Ayris.  Exercise Activities and Dietary recommendations Current Exercise Habits: The patient does not participate in regular exercise at present, Exercise limited by: None identified  Goals    . DIET - INCREASE WATER INTAKE  Recommend increasing water intake to 4-6 glasses a day.        Fall Risk: Fall Risk  06/19/2019 12/12/2018 06/14/2018 06/08/2017 02/17/2016  Falls in the past year? 0 0 0 No No  Number falls in past yr: 0 - - - -  Injury with Fall? 0 - - - -    FALL RISK PREVENTION PERTAINING TO THE HOME:  Any stairs in or around the home? No  If so, are there any without handrails? N/A  Home free of loose throw rugs in walkways, pet beds, electrical cords, etc? Yes  Adequate lighting in your home to reduce risk of falls? Yes   ASSISTIVE DEVICES UTILIZED TO PREVENT FALLS:  Life alert? No  Use of a cane, walker or w/c? No  Grab bars in the bathroom? No  Shower chair or bench in shower? No  Elevated toilet seat or a handicapped toilet? Yes    TIMED UP AND GO:  Was the test performed? No .    Depression Screen PHQ 2/9 Scores 06/19/2019 12/12/2018 12/12/2018 06/14/2018  PHQ - 2 Score 0 0 0 0  PHQ- 9 Score - 1 - -     Cognitive Function     6CIT Screen 06/19/2019 06/08/2017 02/17/2016 01/07/2016  What Year? 0 points 0 points 0 points 0 points  What month? 0 points 0 points 0 points 0 points  What time? 0 points 0 points 0 points 0 points  Count back from 20 0 points 0 points 0 points 0 points   Months in reverse 2 points 0 points 0 points 2 points  Repeat phrase 2 points 0 points 0 points 0 points  Total Score 4 0 0 2    Immunization History  Administered Date(s) Administered  . Fluad Quad(high Dose 65+) 12/12/2018  . Hepatitis B, adult 03/04/2011, 04/08/2011  . Influenza Split 02/24/2012  . Influenza, High Dose Seasonal PF 02/19/2014, 02/04/2015, 01/07/2016, 06/08/2017, 02/23/2018  . Pneumococcal Conjugate-13 05/21/2014  . Pneumococcal Polysaccharide-23 06/29/2011  . Tdap 01/03/2007    Qualifies for Shingles Vaccine? Yes . Due for Shingrix. Pt has been advised to call insurance company to determine out of pocket expense. Advised may also receive vaccine at local pharmacy or Health Dept. Verbalized acceptance and understanding.  Tdap: Although this vaccine is not a covered service during a Wellness Exam, does the patient still wish to receive this vaccine today?  No . Advised may receive this vaccine at local pharmacy or Health Dept. Aware to provide a copy of the vaccination record if obtained from local pharmacy or Health Dept. Verbalized acceptance and understanding.  Flu Vaccine: Up to date  Pneumococcal Vaccine: Completed series  Screening Tests Health Maintenance  Topic Date Due  . TETANUS/TDAP  06/18/2020 (Originally 01/02/2017)  . DEXA SCAN  08/18/2019  . INFLUENZA VACCINE  Completed  . PNA vac Low Risk Adult  Completed    Cancer Screenings:  Colorectal Screening: No longer required.   Mammogram: No longer required.   Bone Density: Completed 08/17/17. Results reflect OSTEOPOROSIS. Repeat every 2 years.   Lung Cancer Screening: (Low Dose CT Chest recommended if Age 42-80 years, 30 pack-year currently smoking OR have quit w/in 15years.) does not qualify.   Additional Screening:  Vision Screening: Recommended annual ophthalmology exams for early detection of glaucoma and other disorders of the eye.  Dental Screening: Recommended annual dental exams for  proper oral hygiene  Community Resource Referral:  CRR required this visit?  No  Plan:  I have personally reviewed and addressed the Medicare Annual Wellness questionnaire and have noted the following in the patient's chart:  A. Medical and social history B. Use of alcohol, tobacco or illicit drugs  C. Current medications and supplements D. Functional ability and status E.  Nutritional status F.  Physical activity G. Advance directives H. List of other physicians I.  Hospitalizations, surgeries, and ER visits in previous 12 months J.  Vitals K. Screenings such as hearing and vision if needed, cognitive and depression L. Referrals and appointments   In addition, I have reviewed and discussed with patient certain preventive protocols, quality metrics, and best practice recommendations. A written personalized care plan for preventive services as well as general preventive health recommendations were provided to patient. Nurse Health Advisor  Signed,    Dequita Schleicher [place], [place]  09/26/2491 Nurse Health Advisor   Nurse Notes: None.

## 2019-06-19 ENCOUNTER — Other Ambulatory Visit: Payer: Self-pay

## 2019-06-19 ENCOUNTER — Ambulatory Visit (INDEPENDENT_AMBULATORY_CARE_PROVIDER_SITE_OTHER): Payer: Medicare PPO

## 2019-06-19 DIAGNOSIS — Z0000 Encounter for general adult medical examination without abnormal findings: Secondary | ICD-10-CM

## 2019-06-19 NOTE — Patient Instructions (Signed)
Lauren Lawrence , Thank you for taking time to come for your Medicare Wellness Visit. I appreciate your ongoing commitment to your health goals. Please review the following plan we discussed and let me know if I can assist you in the future.   Screening recommendations/referrals: Colonoscopy: No longer required.  Mammogram: No longer required.  Bone Density: Up to date, due 08/2019 Recommended yearly ophthalmology/optometry visit for glaucoma screening and checkup Recommended yearly dental visit for hygiene and checkup  Vaccinations: Influenza vaccine: Up to date Pneumococcal vaccine: Completed series Tdap vaccine: Pt declines today.  Shingles vaccine: Pt declines today.     Advanced directives: Please bring a copy of your POA (Power of Attorney) and/or Living Will to your next appointment.   Conditions/risks identified: Recommend increasing water intake to 6-8 8 oz glasses a day.   Next appointment: 12/13/19 @ 10:20 AM with Dr Sullivan Lone.   Preventive Care 34 Years and Older, Female Preventive care refers to lifestyle choices and visits with your health care provider that can promote health and wellness. What does preventive care include?  A yearly physical exam. This is also called an annual well check.  Dental exams once or twice a year.  Routine eye exams. Ask your health care provider how often you should have your eyes checked.  Personal lifestyle choices, including:  Daily care of your teeth and gums.  Regular physical activity.  Eating a healthy diet.  Avoiding tobacco and drug use.  Limiting alcohol use.  Practicing safe sex.  Taking low-dose aspirin every day.  Taking vitamin and mineral supplements as recommended by your health care provider. What happens during an annual well check? The services and screenings done by your health care provider during your annual well check will depend on your age, overall health, lifestyle risk factors, and family history of  disease. Counseling  Your health care provider may ask you questions about your:  Alcohol use.  Tobacco use.  Drug use.  Emotional well-being.  Home and relationship well-being.  Sexual activity.  Eating habits.  History of falls.  Memory and ability to understand (cognition).  Work and work Astronomer.  Reproductive health. Screening  You may have the following tests or measurements:  Height, weight, and BMI.  Blood pressure.  Lipid and cholesterol levels. These may be checked every 5 years, or more frequently if you are over 23 years old.  Skin check.  Lung cancer screening. You may have this screening every year starting at age 50 if you have a 30-pack-year history of smoking and currently smoke or have quit within the past 15 years.  Fecal occult blood test (FOBT) of the stool. You may have this test every year starting at age 13.  Flexible sigmoidoscopy or colonoscopy. You may have a sigmoidoscopy every 5 years or a colonoscopy every 10 years starting at age 71.  Hepatitis C blood test.  Hepatitis B blood test.  Sexually transmitted disease (STD) testing.  Diabetes screening. This is done by checking your blood sugar (glucose) after you have not eaten for a while (fasting). You may have this done every 1-3 years.  Bone density scan. This is done to screen for osteoporosis. You may have this done starting at age 40.  Mammogram. This may be done every 1-2 years. Talk to your health care provider about how often you should have regular mammograms. Talk with your health care provider about your test results, treatment options, and if necessary, the need for more tests. Vaccines  Your health care provider may recommend certain vaccines, such as:  Influenza vaccine. This is recommended every year.  Tetanus, diphtheria, and acellular pertussis (Tdap, Td) vaccine. You may need a Td booster every 10 years.  Zoster vaccine. You may need this after age  74.  Pneumococcal 13-valent conjugate (PCV13) vaccine. One dose is recommended after age 19.  Pneumococcal polysaccharide (PPSV23) vaccine. One dose is recommended after age 2. Talk to your health care provider about which screenings and vaccines you need and how often you need them. This information is not intended to replace advice given to you by your health care provider. Make sure you discuss any questions you have with your health care provider. Document Released: 05/02/2015 Document Revised: 12/24/2015 Document Reviewed: 02/04/2015 Elsevier Interactive Patient Education  2017 [employer] Prevention in the Home Falls can cause injuries. They can happen to people of all ages. There are many things you can do to make your home safe and to help prevent falls. What can I do on the outside of my home?  Regularly fix the edges of walkways and driveways and fix any cracks.  Remove anything that might make you trip as you walk through a door, such as a raised step or threshold.  Trim any bushes or trees on the path to your home.  Use bright outdoor lighting.  Clear any walking paths of anything that might make someone trip, such as rocks or tools.  Regularly check to see if handrails are loose or broken. Make sure that both sides of any steps have handrails.  Any raised decks and porches should have guardrails on the edges.  Have any leaves, snow, or ice cleared regularly.  Use sand or salt on walking paths during winter.  Clean up any spills in your garage right away. This includes oil or grease spills. What can I do in the bathroom?  Use night lights.  Install grab bars by the toilet and in the tub and shower. Do not use towel bars as grab bars.  Use non-skid mats or decals in the tub or shower.  If you need to sit down in the shower, use a plastic, non-slip stool.  Keep the floor dry. Clean up any water that spills on the floor as soon as it happens.  Remove  soap buildup in the tub or shower regularly.  Attach bath mats securely with double-sided non-slip rug tape.  Do not have throw rugs and other things on the floor that can make you trip. What can I do in the bedroom?  Use night lights.  Make sure that you have a light by your bed that is easy to reach.  Do not use any sheets or blankets that are too big for your bed. They should not hang down onto the floor.  Have a firm chair that has side arms. You can use this for support while you get dressed.  Do not have throw rugs and other things on the floor that can make you trip. What can I do in the kitchen?  Clean up any spills right away.  Avoid walking on wet floors.  Keep items that you use a lot in easy-to-reach places.  If you need to reach something above you, use a strong step stool that has a grab bar.  Keep electrical cords out of the way.  Do not use floor polish or wax that makes floors slippery. If you must use wax, use non-skid floor wax.  Do  not have throw rugs and other things on the floor that can make you trip. What can I do with my stairs?  Do not leave any items on the stairs.  Make sure that there are handrails on both sides of the stairs and use them. Fix handrails that are broken or loose. Make sure that handrails are as long as the stairways.  Check any carpeting to make sure that it is firmly attached to the stairs. Fix any carpet that is loose or worn.  Avoid having throw rugs at the top or bottom of the stairs. If you do have throw rugs, attach them to the floor with carpet tape.  Make sure that you have a light switch at the top of the stairs and the bottom of the stairs. If you do not have them, ask someone to add them for you. What else can I do to help prevent falls?  Wear shoes that:  Do not have high heels.  Have rubber bottoms.  Are comfortable and fit you well.  Are closed at the toe. Do not wear sandals.  If you use a  stepladder:  Make sure that it is fully opened. Do not climb a closed stepladder.  Make sure that both sides of the stepladder are locked into place.  Ask someone to hold it for you, if possible.  Clearly mark and make sure that you can see:  Any grab bars or handrails.  First and last steps.  Where the edge of each step is.  Use tools that help you move around (mobility aids) if they are needed. These include:  Canes.  Walkers.  Scooters.  Crutches.  Turn on the lights when you go into a dark area. Replace any light bulbs as soon as they burn out.  Set up your furniture so you have a clear path. Avoid moving your furniture around.  If any of your floors are uneven, fix them.  If there are any pets around you, be aware of where they are.  Review your medicines with your doctor. Some medicines can make you feel dizzy. This can increase your chance of falling. Ask your doctor what other things that you can do to help prevent falls. This information is not intended to replace advice given to you by your health care provider. Make sure you discuss any questions you have with your health care provider. Document Released: 01/30/2009 Document Revised: 09/11/2015 Document Reviewed: 05/10/2014 Elsevier Interactive Patient Education  2017 [employer].

## 2019-06-20 ENCOUNTER — Other Ambulatory Visit: Payer: Self-pay | Admitting: Family Medicine

## 2019-06-20 DIAGNOSIS — I10 Essential (primary) hypertension: Secondary | ICD-10-CM

## 2019-06-20 NOTE — Telephone Encounter (Signed)
Requested Prescriptions  Pending Prescriptions Disp Refills  . amLODipine (NORVASC) 5 MG tablet [Pharmacy Med Name: AMLODIPINE BESYLATE 5 MG TAB] 90 tablet 3    Sig: TAKE 1 TABLET BY MOUTH EVERY DAY     Cardiovascular:  Calcium Channel Blockers Passed - 06/20/2019  1:01 AM      Passed - Last BP in normal range    BP Readings from Last 1 Encounters:  12/12/18 118/72         Passed - Valid encounter within last 6 months    Recent Outpatient Visits          6 months ago Annual physical exam   [facility] [name], MD   1 year ago Essential (primary) hypertension   [facility] [name], MD   1 year ago Essential (primary) hypertension   [facility] [name], MD   2 years ago Annual physical exam   [facility] [name], MD   2 years ago Essential (primary) hypertension   [facility] [name], MD

## 2019-07-03 ENCOUNTER — Other Ambulatory Visit: Payer: Self-pay | Admitting: Family Medicine

## 2019-07-03 DIAGNOSIS — I10 Essential (primary) hypertension: Secondary | ICD-10-CM

## 2019-07-03 NOTE — Telephone Encounter (Signed)
Requested Prescriptions  Pending Prescriptions Disp Refills  . metoprolol succinate (TOPROL-XL) 50 MG 24 hr tablet [Pharmacy Med Name: METOPROLOL SUCC ER 50 MG TAB] 90 tablet 3    Sig: TAKE 1 TABLET BY MOUTH DAILY. TAKE WITH OR IMMEDIATELY FOLLOWING A MEAL     Cardiovascular:  Beta Blockers Passed - 07/03/2019  1:38 AM      Passed - Last BP in normal range    BP Readings from Last 1 Encounters:  12/12/18 118/72         Passed - Last Heart Rate in normal range    Pulse Readings from Last 1 Encounters:  12/12/18 68         Passed - Valid encounter within last 6 months    Recent Outpatient Visits          6 months ago Annual physical exam   [facility] [name], MD   1 year ago Essential (primary) hypertension   [facility] [name], MD   1 year ago Essential (primary) hypertension   [facility] [name], MD   2 years ago Annual physical exam   [facility] [name], MD   2 years ago Essential (primary) hypertension   [facility] [name], MD

## 2019-07-09 ENCOUNTER — Encounter: Payer: Self-pay | Admitting: Adult Health

## 2019-07-09 ENCOUNTER — Ambulatory Visit
Admission: RE | Admit: 2019-07-09 | Discharge: 2019-07-09 | Disposition: A | Discharge status: Home / Self Care | Payer: Medicare PPO | Source: Ambulatory Visit | Attending: Adult Health | Admitting: Adult Health

## 2019-07-09 ENCOUNTER — Ambulatory Visit: Payer: Medicare PPO | Admitting: Adult Health

## 2019-07-09 ENCOUNTER — Other Ambulatory Visit: Payer: Self-pay

## 2019-07-09 ENCOUNTER — Other Ambulatory Visit
Admission: RE | Admit: 2019-07-09 | Discharge: 2019-07-09 | Disposition: A | Discharge status: Home / Self Care | Payer: Medicare PPO | Source: Home / Self Care | Attending: Adult Health | Admitting: Adult Health

## 2019-07-09 ENCOUNTER — Ambulatory Visit: Payer: Self-pay

## 2019-07-09 VITALS — BP 136/80 | HR 106 | Temp 97.7°F | Resp 16 | Wt 118.6 lb

## 2019-07-09 DIAGNOSIS — M79661 Pain in right lower leg: Secondary | ICD-10-CM | POA: Insufficient documentation

## 2019-07-09 DIAGNOSIS — M7989 Other specified soft tissue disorders: Secondary | ICD-10-CM

## 2019-07-09 DIAGNOSIS — L539 Erythematous condition, unspecified: Secondary | ICD-10-CM | POA: Diagnosis not present

## 2019-07-09 DIAGNOSIS — R600 Localized edema: Secondary | ICD-10-CM | POA: Diagnosis not present

## 2019-07-09 LAB — CBC WITH DIFFERENTIAL/PLATELET
Abs Immature Granulocytes: 0.01 10*3/uL (ref 0.00–0.07)
Basophils Absolute: 0 10*3/uL (ref 0.0–0.1)
Basophils Relative: 1 %
Eosinophils Absolute: 0.1 10*3/uL (ref 0.0–0.5)
Eosinophils Relative: 2 %
HCT: 39.9 % (ref 36.0–46.0)
Hemoglobin: 13.3 g/dL (ref 12.0–15.0)
Immature Granulocytes: 0 %
Lymphocytes Relative: 18 %
Lymphs Abs: 0.7 10*3/uL (ref 0.7–4.0)
MCH: 31 pg (ref 26.0–34.0)
MCHC: 33.3 g/dL (ref 30.0–36.0)
MCV: 93 fL (ref 80.0–100.0)
Monocytes Absolute: 0.3 10*3/uL (ref 0.1–1.0)
Monocytes Relative: 9 %
Neutro Abs: 2.7 10*3/uL (ref 1.7–7.7)
Neutrophils Relative %: 70 %
Platelets: 82 10*3/uL — ABNORMAL LOW (ref 150–400)
RBC: 4.29 MIL/uL (ref 3.87–5.11)
RDW: 14.2 % (ref 11.5–15.5)
WBC: 3.8 10*3/uL — ABNORMAL LOW (ref 4.0–10.5)
nRBC: 0 % (ref 0.0–0.2)

## 2019-07-09 LAB — COMPREHENSIVE METABOLIC PANEL
ALT: 30 U/L (ref 0–44)
AST: 41 U/L (ref 15–41)
Albumin: 4.2 g/dL (ref 3.5–5.0)
Alkaline Phosphatase: 59 U/L (ref 38–126)
Anion gap: 8 (ref 5–15)
BUN: 12 mg/dL (ref 8–23)
CO2: 27 mmol/L (ref 22–32)
Calcium: 9.4 mg/dL (ref 8.9–10.3)
Chloride: 101 mmol/L (ref 98–111)
Creatinine, Ser: 0.56 mg/dL (ref 0.44–1.00)
GFR calc Af Amer: >60 mL/min (ref >60)
GFR calc non Af Amer: >60 mL/min (ref >60)
Glucose, Bld: 119 mg/dL — ABNORMAL HIGH (ref 70–99)
Potassium: 3.5 mmol/L (ref 3.5–5.1)
Sodium: 136 mmol/L (ref 135–145)
Total Bilirubin: 1 mg/dL (ref 0.3–1.2)
Total Protein: 6.9 g/dL (ref 6.5–8.1)

## 2019-07-09 NOTE — Addendum Note (Signed)
Addended by: Berniece Pap on: 07/09/2019 01:44 PM   Modules accepted: Orders

## 2019-07-09 NOTE — Progress Notes (Addendum)
Patient: Lauren Lawrence Female    DOB: 08-27-1940   79 y.o.   MRN: [id number] Visit Date: 07/09/2019  Today's Provider: Beverely Pace Flinchum, FNP   Subjective:  CC: right lower leg pain    Patient is a 79 year old female in no acute distress who comes to the clinic for evaluation of her right lower leg. She reports she noticed this area on 07/08/2019 and had been working outside, she denies any bites or exposures.   She has swelling of her right lower extremity  Medial and bulging of vein. She denies any injury or trauma. She has pain with touching the area. No pain with walking. She has some surrounding edema of localized area of right lower extremity.   Denies any itching of skin.  No history of previous DVT. She denies any injury known, no recent surgeries or flight.  History of low platelets actually.  No othetr associated symptoms.  Patient  denies any fever,,chills,  chest pain, shortness of breath, nausea, vomiting, or diarrhea.      No Known Allergies   Current Outpatient Medications:  .  amLODipine (NORVASC) 5 MG tablet, TAKE 1 TABLET BY MOUTH EVERY DAY, Disp: 90 tablet, Rfl: 3 .  Ascorbic Acid 500 MG CAPS, Take by mouth daily. , Disp: , Rfl:  .  Cholecalciferol 1000 UNITS TBDP, Take by mouth daily. , Disp: , Rfl:  .  Coenzyme Q10 10 MG capsule, Take 10 mg by mouth daily., Disp: , Rfl:  .  levothyroxine (SYNTHROID) 88 MCG tablet, TAKE 1 TABLET BY MOUTH EVERY DAY, Disp: 90 tablet, Rfl: 2 .  Magnesium 400 MG CAPS, Take by mouth daily. , Disp: , Rfl:  .  metoprolol succinate (TOPROL-XL) 50 MG 24 hr tablet, TAKE 1 TABLET BY MOUTH DAILY. TAKE WITH OR IMMEDIATELY FOLLOWING A MEAL, Disp: 30 tablet, Rfl: 0 .  Milk Thistle-Dand-Fennel-Licor (MILK THISTLE XTRA) CAPS, Take by mouth daily. , Disp: , Rfl:  .  raloxifene (EVISTA) 60 MG tablet, TAKE 1 TABLET BY MOUTH ONCE A DAY (Patient taking differently: TAKE 1 TABLET BY MOUTH ONCE A DAY (takes a couple times a week)), Disp: 90  tablet, Rfl: 3 .  traZODone (DESYREL) 50 MG tablet, Take 0.5-1 tablets (25-50 mg total) by mouth at bedtime as needed for sleep., Disp: 90 tablet, Rfl: 1 .  vitamin E 400 UNIT capsule, Take 400 Units by mouth daily. , Disp: , Rfl:  .  clobetasol ointment (TEMOVATE) 0.05 %, , Disp: , Rfl:  .  fluticasone (FLONASE) 50 MCG/ACT nasal spray, USE 1-2 SQUIRTS EACH NOSTRIL DAILY (Patient not taking: Reported on 07/09/2019), Disp: 16 g, Rfl: 12 .  hydrochlorothiazide (HYDRODIURIL) 25 MG tablet, TAKE 1 TABLET BY MOUTH EVERY DAY AS NEEDED (Patient not taking: Reported on 06/19/2019), Disp: 30 tablet, Rfl: 12 .  losartan (COZAAR) 100 MG tablet, TAKE 1 TABLET BY MOUTH EVERY DAY, Disp: 90 tablet, Rfl: 3 .  triamcinolone cream (KENALOG) 0.1 %, , Disp: , Rfl:  .  triamcinolone cream (KENALOG) 0.5 %, Apply 1 application topically daily. (Patient not taking: Reported on 07/09/2019), Disp: 30 g, Rfl: 0  Review of Systems  Constitutional: Negative for activity change, appetite change, chills, diaphoresis, fatigue, fever and unexpected weight change.  HENT: Negative for congestion, rhinorrhea and sore throat.   Eyes: Negative for pain.  Respiratory: Negative.  Negative for cough and shortness of breath.   Cardiovascular: Negative.   Gastrointestinal: Negative.  Negative for anorexia,  diarrhea and vomiting.  Genitourinary: Negative.   Musculoskeletal: Negative for joint pain.  Skin: Positive for color change (mild erythema with pain over area/ swollen area right lower extremity ). Negative for nail changes, pallor, rash and wound.  Neurological: Negative for dizziness, seizures, facial asymmetry, speech difficulty, light-headedness and headaches.  Psychiatric/Behavioral: Negative.     Social History   Tobacco Use  . Smoking status: Never Smoker  . Smokeless tobacco: Never Used  Substance Use Topics  . Alcohol use: No      Objective:    Body mass index is 23.16 kg/m. Today's Vitals   07/09/19 1105  BP:  136/80  Pulse: (!) 106  Resp: 16  Temp: 97.7 F (36.5 C)  TempSrc: Oral  SpO2: 99%  Weight: 118 lb 9.6 oz (53.8 kg)   Body mass index is 23.16 kg/m. Wells Score 2 Moderate probability.   Physical Exam Constitutional:      Appearance: Normal appearance.  HENT:     Head: Normocephalic and atraumatic.     Nose: Nose normal.  Eyes:     General: No scleral icterus.       Right eye: No discharge.        Left eye: No discharge.     Conjunctiva/sclera: Conjunctivae normal.     Pupils: Pupils are equal, round, and reactive to light.  Cardiovascular:     Rate and Rhythm: Normal rate and regular rhythm.  Pulmonary:     Effort: Pulmonary effort is normal.     Breath sounds: Normal breath sounds and air entry.  Abdominal:     General: Abdomen is flat.     Palpations: Abdomen is soft.  Musculoskeletal:     Cervical back: Normal range of motion.     Right upper leg: Normal.     Left upper leg: Normal.     Right lower leg: Tenderness present. Edema (mild edema localized to area with pain over apperance and palpation  of dilated superficial veins,  denies injury , lower extremity medial side.  warmth and mild erythema present - see media for photos . negative Homans sign. ) present.     Right ankle: Normal.     Right Achilles Tendon: Normal.     Left ankle:     Left Achilles Tendon: Normal.     Right foot: Normal.     Left foot: Normal.       Legs:     Comments: No foot or ankle involvement. Popliteal palpated and no abnormality palpated.   Skin:    General: Skin is warm.     Capillary Refill: Capillary refill takes less than 2 seconds.     Coloration: Skin is not jaundiced or pale.     Findings: Erythema (mild over area of concern.) present. No bruising, lesion or rash.  Neurological:     Mental Status: She is alert.  Psychiatric:        Mood and Affect: Mood normal.        Behavior: Behavior normal.        Thought Content: Thought content normal.        Judgment: Judgment  normal.      No results found for any visits on 07/09/19.     Assessment & Plan          ICD-10-CM   1. Pain and swelling of right lower leg  M79.661 US Venous Img Lower Unilateral Right (DVT)   M79.89 CBC with Differential/Platelet    Comprehensive  Metabolic Panel (CMET)  2. Skin erythema  L53.9    Will check DVT ultrasound rule out DVT, other differentials include possible mild starting of cellulitis, incidental injury unknown cause while working outside, skin irritation overlying normal varicose veins, however patient reports her veins do not usually protrude in this area and this is new.   Ultrasound today at [facility] imaging. Will call patient with results. Advised of RED flags and when to seek care immediately.   Return in about 1 week (around 07/16/2019), or if symptoms worsen or fail to improve, for at any time for any worsening symptoms, Go to Emergency room/ urgent care if worse.  Advised patient call the office or your primary care doctor for an appointment if no improvement within 72 hours or if any symptoms change or worsen at any time  Advised ER or urgent Care if after hours or on weekend. Call 911 for emergency symptoms at any time.Patinet verbalized understanding of all instructions given/reviewed and treatment plan and has no further questions or concerns at this time.     The entirety of the information documented in the History of Present Illness, Review of Systems and Physical Exam were personally obtained by me. Portions of this information were initially documented by the  Certified Medical Assistant whose name is documented in Epic and reviewed by me for thoroughness and accuracy.  I have personally performed the exam and reviewed the chart and it is accurate to the best of my knowledge.  Museum/gallery conservator has been used and any errors in dictation or transcription are unintentional.  Eula Fried. Flinchum FNP-C  [facility]  Jairo Ben, FNP  [facility]

## 2019-07-09 NOTE — Telephone Encounter (Signed)
Pt. Noticed this weekend that she has a reddened area to the inner aspect of her right ankle. Quarter-sized, area is swollen. Mild pain.Does not remember hurting the ankle. No difficulty ambulating. Appointment made for today.  Reason for Disposition . [1] Redness of the skin AND [2] no fever  Answer Assessment - Initial Assessment Questions 1. ONSET: "When did the pain start?"      This weekend 2. LOCATION: "Where is the pain located?"      Right inner ankle 3. PAIN: "How bad is the pain?"    (Scale 1-10; or mild, moderate, severe)  - MILD (1-3): doesn't interfere with normal activities   - MODERATE (4-7): interferes with normal activities (e.g., work or school) or awakens from sleep, limping   - SEVERE (8-10): excruciating pain, unable to do any normal activities, unable to walk      If she touches it - Mild 4. WORK OR EXERCISE: "Has there been any recent work or exercise that involved this part of the body?"      No 5. CAUSE: "What do you think is causing the ankle pain?"     Unsure 6. OTHER SYMPTOMS: "Do you have any other symptoms?" (e.g., calf pain, rash, fever, swelling)     No fever, no calf pain 7. PREGNANCY: "Is there any chance you are pregnant?" "When was your last menstrual period?"     No  Protocols used: ANKLE PAIN-A-AH

## 2019-07-10 ENCOUNTER — Other Ambulatory Visit: Payer: Self-pay | Admitting: Adult Health

## 2019-07-10 DIAGNOSIS — L03115 Cellulitis of right lower limb: Secondary | ICD-10-CM | POA: Insufficient documentation

## 2019-07-10 DIAGNOSIS — T148XXA Other injury of unspecified body region, initial encounter: Secondary | ICD-10-CM | POA: Insufficient documentation

## 2019-07-10 DIAGNOSIS — D696 Thrombocytopenia, unspecified: Secondary | ICD-10-CM | POA: Insufficient documentation

## 2019-07-10 MED ORDER — CEPHALEXIN 500 MG PO CAPS: 500.0000 mg | ORAL_CAPSULE | Freq: Three times a day (TID) | ORAL | 0 refills | Status: DC

## 2019-07-10 NOTE — Progress Notes (Signed)
Meds ordered this encounter  Medications  . cephALEXin (KEFLEX) 500 MG capsule    Sig: Take 1 capsule (500 mg total) by mouth 3 (three) times daily.    Dispense:  30 capsule    Refill:  0   No DVT by ultrasound.  Cellulitis of right lower extremity  Skin trauma  Thrombocytopenia (HCC)  Advised patient call the office or your primary care doctor for an appointment if no improvement within 72 hours or if any symptoms change or worsen at any time  Advised ER or urgent Care if after hours or on weekend.  Patient verbalized understanding of all instructions given and denies any further questions at this time.

## 2019-07-10 NOTE — Progress Notes (Signed)
Provider called patient 07/10/19 with results. She reports her leg is no worse than yesterdays office visit and maybe slightly improved.  No DVT on ultrasound. Suspect possible superficial cellulitis of skin or trauma/ bruising while patient was working outside  yesterday. Will cover with Keflex given mild warmth and erythema on exam. Thrombocytopenia history - platelets 83 on CBC 07/09/19. Avoid NSAID's. Will discuss with Dr. Sullivan Lone for any further follow up/ recommendations as patient is unsure when or if she saw hematology in the past. .  Return to the office if any symptoms change or worsen. Patient was advised given her varicose veins she should wear compression stockings.

## 2019-07-29 ENCOUNTER — Other Ambulatory Visit: Payer: Self-pay | Admitting: Family Medicine

## 2019-07-29 DIAGNOSIS — I10 Essential (primary) hypertension: Secondary | ICD-10-CM

## 2019-08-12 ENCOUNTER — Other Ambulatory Visit: Payer: Self-pay | Admitting: Family Medicine

## 2019-08-12 NOTE — Telephone Encounter (Signed)
Requested Prescriptions  Pending Prescriptions Disp Refills  . levothyroxine (SYNTHROID) 88 MCG tablet [Pharmacy Med Name: LEVOTHYROXINE 88 MCG TABLET] 90 tablet 1    Sig: TAKE 1 TABLET BY MOUTH EVERY DAY     Endocrinology:  Hypothyroid Agents Failed - 08/12/2019 12:59 AM      Failed - TSH needs to be rechecked within 3 months after an abnormal result. Refill until TSH is due.      Passed - TSH in normal range and within 360 days    TSH  Date Value Ref Range Status  12/12/2018 2.410 0.450 - 4.500 uIU/mL Final         Passed - Valid encounter within last 12 months    Recent Outpatient Visits          1 month ago Pain and swelling of right lower leg   [facility] Flinchum, Eula Fried, FNP   8 months ago Annual physical exam   [facility] [name], MD   1 year ago Essential (primary) hypertension   [facility] [name], MD   1 year ago Essential (primary) hypertension   [facility] [name], MD   2 years ago Annual physical exam   [facility] [name], MD

## 2019-08-27 NOTE — Progress Notes (Signed)
Established patient visit  I,[name],acting as a scribe for [name], MD.,have documented all relevant documentation on the behalf of [name], MD,as directed by  [name], MD while in the presence of [name], MD.   Patient: Lauren Lawrence   DOB: August 13, 1940   79 y.o. Female  MRN: [id number] Visit Date: 08/28/2019  Today's healthcare provider: [name], MD   Chief Complaint  Patient presents with  . Memory Issues   Subjective    HPI Patient comes in today and we connected with her daughter in [place] on a phone call.  Family/children are concerned about continued mild memory loss.  She has no behavioral issues and she is not a danger to herself or others.  There is questions about what medications we might be able to stop.  She is on several supplements.  Past Medical History:  Diagnosis Date  . Depression   . Hyperlipidemia   . Hypertension        Medications: Outpatient Medications Prior to Visit  Medication Sig  . Ascorbic Acid 500 MG CAPS Take by mouth daily.   . Cholecalciferol 1000 UNITS TBDP Take by mouth daily.   . Coenzyme Q10 10 MG capsule Take 10 mg by mouth daily.  . fluticasone (FLONASE) 50 MCG/ACT nasal spray USE 1-2 SQUIRTS EACH NOSTRIL DAILY  . levothyroxine (SYNTHROID) 88 MCG tablet TAKE 1 TABLET BY MOUTH EVERY DAY  . losartan (COZAAR) 100 MG tablet TAKE 1 TABLET BY MOUTH EVERY DAY  . Magnesium 400 MG CAPS Take by mouth daily.   . metoprolol succinate (TOPROL-XL) 50 MG 24 hr tablet TAKE 1 TABLET BY MOUTH DAILY. TAKE WITH OR IMMEDIATELY FOLLOWING A MEAL  . Milk Thistle-Dand-Fennel-Licor (MILK THISTLE XTRA) CAPS Take by mouth daily.   . raloxifene (EVISTA) 60 MG tablet TAKE 1 TABLET BY MOUTH ONCE A DAY (Patient taking differently: TAKE 1 TABLET BY MOUTH ONCE A DAY (takes a couple times a week))  . amLODipine (NORVASC) 5 MG tablet TAKE 1 TABLET BY MOUTH EVERY DAY  . cephALEXin (KEFLEX) 500 MG capsule  Take 1 capsule (500 mg total) by mouth 3 (three) times daily. (Patient not taking: Reported on 08/28/2019)  . clobetasol ointment (TEMOVATE) 0.05 %   . hydrochlorothiazide (HYDRODIURIL) 25 MG tablet TAKE 1 TABLET BY MOUTH EVERY DAY AS NEEDED (Patient not taking: Reported on 06/19/2019)  . traZODone (DESYREL) 50 MG tablet Take 0.5-1 tablets (25-50 mg total) by mouth at bedtime as needed for sleep.  Marland Kitchen triamcinolone cream (KENALOG) 0.1 %   . triamcinolone cream (KENALOG) 0.5 % Apply 1 application topically daily. (Patient not taking: Reported on 07/09/2019)  . vitamin E 400 UNIT capsule Take 400 Units by mouth daily.    No facility-administered medications prior to visit.    Review of Systems  Constitutional: Negative for appetite change, chills, fatigue and fever.  HENT: Negative.   Eyes: Negative.   Respiratory: Negative for chest tightness and shortness of breath.   Cardiovascular: Negative for chest pain and palpitations.  Gastrointestinal: Negative for abdominal pain, nausea and vomiting.  Endocrine: Negative.   Musculoskeletal: Negative.   Allergic/Immunologic: Negative.   Neurological: Negative for dizziness and weakness.       Mild short-term memory loss  Hematological: Negative.   Psychiatric/Behavioral: Negative.        Objective    BP (!) 210/76 (BP Location: Left Arm, Cuff Size: Normal)   Pulse 75   Temp Marland Kitchen)  97.5 F (36.4 C) (Other (Comment))   Resp 18   Ht 5' (1.524 m)   Wt 119 lb (54 kg)   SpO2 96%   BMI 23.24 kg/m  BP Readings from Last 3 Encounters:  08/28/19 (!) 210/76  07/09/19 136/80  12/12/18 118/72   Wt Readings from Last 3 Encounters:  08/28/19 119 lb (54 kg)  07/09/19 118 lb 9.6 oz (53.8 kg)  12/12/18 121 lb (54.9 kg)      Physical Exam Vitals and nursing note reviewed.  Constitutional:      Appearance: Normal appearance. She is normal weight.  HENT:     Right Ear: Tympanic membrane normal.     Left Ear: Tympanic membrane normal.     Nose:  Nose normal.     Mouth/Throat:     Mouth: Mucous membranes are moist.     Pharynx: Oropharynx is clear.  Eyes:     Conjunctiva/sclera: Conjunctivae normal.  Cardiovascular:     Rate and Rhythm: Normal rate and regular rhythm.     Pulses: Normal pulses.     Heart sounds: Normal heart sounds.  Pulmonary:     Effort: Pulmonary effort is normal.     Breath sounds: Normal breath sounds.  Abdominal:     General: Bowel sounds are normal.     Palpations: Abdomen is soft.  Musculoskeletal:        General: Normal range of motion.     Cervical back: Normal range of motion and neck supple.  Skin:    General: Skin is warm and dry.  Neurological:     Mental Status: She is alert.  Psychiatric:        Mood and Affect: Mood normal.        Behavior: Behavior normal.        Thought Content: Thought content normal.        Judgment: Judgment normal.     MMSE - Mini Mental State Exam 08/28/2019  Orientation to time 5  Orientation to Place 5  Registration 2  Attention/ Calculation 5  Recall 2  Language- name 2 objects 2  Language- repeat 1  Language- follow 3 step command 3  Language- read & follow direction 1  Write a sentence 1  Copy design 1  Total score 28     No results found for any visits on 08/28/19.  Assessment & Plan     1. Mild cognitive impairment Try donepezil 5 mg daily. More than 50% 25 minute visit spent in counseling or coordination of care.  Daughter is in agreement with plan.  Return to clinic 2 to 3 months Refer to neurology to make sure of diagnosis.  MMSE today is 28/30. - donepezil (ARICEPT) 5 MG tablet; Take 1 tablet (5 mg total) by mouth at bedtime.  Dispense: 30 tablet; Refill: 1 - Ambulatory referral to Neurology  2. Essential (primary) hypertension For blood pressure, be sure to take amlodipine 5 mg daily with metoprolol and losartan. '  Return in about 3 weeks (around 09/18/2019).      I, [name], MD, have reviewed all documentation for this  visit. The documentation on 08/29/19 for the exam, diagnosis, procedures, and orders are all accurate and complete.    Brinda Focht Cranford Mon, MD  [facility] [phone] (phone) [phone] (fax)  [place]

## 2019-08-28 ENCOUNTER — Other Ambulatory Visit: Payer: Self-pay

## 2019-08-28 ENCOUNTER — Encounter: Payer: Self-pay | Admitting: Family Medicine

## 2019-08-28 ENCOUNTER — Ambulatory Visit (INDEPENDENT_AMBULATORY_CARE_PROVIDER_SITE_OTHER): Payer: Medicare PPO | Admitting: Family Medicine

## 2019-08-28 VITALS — BP 210/76 | HR 75 | Temp 97.5°F | Resp 18 | Ht 60.0 in | Wt 119.0 lb

## 2019-08-28 DIAGNOSIS — I10 Essential (primary) hypertension: Secondary | ICD-10-CM

## 2019-08-28 DIAGNOSIS — G3184 Mild cognitive impairment, so stated: Secondary | ICD-10-CM | POA: Diagnosis not present

## 2019-08-28 MED ORDER — DONEPEZIL HCL 5 MG PO TABS: 5.0000 mg | ORAL_TABLET | Freq: Every day | ORAL | 1 refills | Status: DC

## 2019-08-28 NOTE — Patient Instructions (Addendum)
For blood pressure, be sure to take amlodipine 5 mg daily with metoprolol and losartan.

## 2019-09-12 NOTE — Progress Notes (Signed)
Established patient visit  I,Lauren Lawrence,acting as a scribe for Lauren Durie, MD.,have documented all relevant documentation on the behalf of Lauren Durie, MD,as directed by  Lauren Durie, MD while in the presence of Lauren Durie, MD.   Patient: Lauren Lawrence   DOB: 01-29-1941   79 y.o. Female  MRN: [id number] Visit Date: 09/18/2019  Today's healthcare provider: [name], MD   Chief Complaint  Patient presents with   Follow-up   Hypertension   Subjective    HPI Patient comes in today for follow-up of cognitive impairment and high blood pressure.  Overall she feels pretty well and has no specific complaints today. Hypertension, follow-up  BP Readings from Last 3 Encounters:  09/18/19 (!) 182/75  08/28/19 (!) 210/76  07/09/19 136/80   Wt Readings from Last 3 Encounters:  09/18/19 116 lb (52.6 kg)  08/28/19 119 lb (54 kg)  07/09/19 118 lb 9.6 oz (53.8 kg)     She was last seen for hypertension 3 weeks ago.  BP at that visit was 210/76. Management since that visit includes; advised to take amlodipine, metoprolol, and losartan. She reports good compliance with treatment. She is not having side effects. none She is exercising. She is adherent to low salt diet.   Outside blood pressures are none.  She does not smoke.  Use of agents associated with hypertension: none.   --------------------------------------------------------------------  Mild cognitive impairment From 08/28/2019-Try donepezil 5 mg daily. More than 50% 25 minute visit spent in counseling or coordination of care.  Daughter is in agreement with plan.  Return to clinic 2 to 3 months. Referred to neurology to make sure of diagnosis. MMSE today is 28/30.   Patient states she is doing well on donepezil.       Medications: Outpatient Medications Prior to Visit  Medication Sig   amLODipine (NORVASC) 5 MG tablet TAKE 1 TABLET BY MOUTH EVERY DAY   Ascorbic Acid 500 MG  CAPS Take by mouth daily.    Cholecalciferol 1000 UNITS TBDP Take by mouth daily.    clobetasol ointment (TEMOVATE) 0.05 %    Coenzyme Q10 10 MG capsule Take 10 mg by mouth daily.   donepezil (ARICEPT) 5 MG tablet Take 1 tablet (5 mg total) by mouth at bedtime.   fluticasone (FLONASE) 50 MCG/ACT nasal spray USE 1-2 SQUIRTS EACH NOSTRIL DAILY   levothyroxine (SYNTHROID) 88 MCG tablet TAKE 1 TABLET BY MOUTH EVERY DAY   losartan (COZAAR) 100 MG tablet TAKE 1 TABLET BY MOUTH EVERY DAY   Magnesium 400 MG CAPS Take by mouth daily.    metoprolol succinate (TOPROL-XL) 50 MG 24 hr tablet TAKE 1 TABLET BY MOUTH DAILY. TAKE WITH OR IMMEDIATELY FOLLOWING A MEAL   Milk Thistle-Dand-Fennel-Licor (MILK THISTLE XTRA) CAPS Take by mouth daily.    raloxifene (EVISTA) 60 MG tablet TAKE 1 TABLET BY MOUTH ONCE A DAY (Patient taking differently: TAKE 1 TABLET BY MOUTH ONCE A DAY (takes a couple times a week))   traZODone (DESYREL) 50 MG tablet Take 0.5-1 tablets (25-50 mg total) by mouth at bedtime as needed for sleep.   triamcinolone cream (KENALOG) 0.1 %    vitamin E 400 UNIT capsule Take 400 Units by mouth daily.    cephALEXin (KEFLEX) 500 MG capsule Take 1 capsule (500 mg total) by mouth 3 (three) times daily. (Patient not taking: Reported on 08/28/2019)   hydrochlorothiazide (HYDRODIURIL) 25 MG tablet TAKE 1 TABLET BY MOUTH EVERY DAY  AS NEEDED (Patient not taking: Reported on 06/19/2019)   [DISCONTINUED] triamcinolone cream (KENALOG) 0.5 % Apply 1 application topically daily. (Patient not taking: Reported on 07/09/2019)   No facility-administered medications prior to visit.    Review of Systems  Constitutional: Negative for appetite change, chills, fatigue and fever.  HENT: Negative.   Eyes: Negative.   Respiratory: Negative for chest tightness and shortness of breath.   Cardiovascular: Negative for chest pain and palpitations.  Gastrointestinal: Negative for abdominal pain, nausea and  vomiting.  Endocrine: Negative.   Musculoskeletal: Negative.   Allergic/Immunologic: Negative.   Neurological: Negative for dizziness and weakness.  Psychiatric/Behavioral: Negative.        Objective    BP (!) 182/75 (BP Location: Left Arm, Patient Position: Sitting, Cuff Size: Normal)    Pulse 97    Temp 97.8 F (36.6 C) (Other (Comment))    Resp 16    Ht 5' (1.524 m)    Wt 116 lb (52.6 kg)    SpO2 98%    BMI 22.65 kg/m  BP Readings from Last 3 Encounters:  09/18/19 (!) 171/72  08/28/19 (!) 210/76  07/09/19 136/80   Wt Readings from Last 3 Encounters:  09/18/19 116 lb (52.6 kg)  08/28/19 119 lb (54 kg)  07/09/19 118 lb 9.6 oz (53.8 kg)      Physical Exam Vitals reviewed.  Constitutional:      Appearance: She is well-developed.  HENT:     Head: Normocephalic and atraumatic.     Right Ear: External ear normal.     Left Ear: External ear normal.     Nose: Nose normal.  Eyes:     General: No scleral icterus.    Conjunctiva/sclera: Conjunctivae normal.     Pupils: Pupils are equal, round, and reactive to light.  Neck:     Thyroid: No thyromegaly.     Comments: Small stable nodule isthmus of thyroid. Cardiovascular:     Rate and Rhythm: Normal rate and regular rhythm.     Heart sounds: Normal heart sounds.  Pulmonary:     Effort: Pulmonary effort is normal.     Breath sounds: Normal breath sounds.  Abdominal:     General: Bowel sounds are normal.     Palpations: Abdomen is soft.  Musculoskeletal:        General: Normal range of motion.     Cervical back: Normal range of motion and neck supple.  Skin:    General: Skin is warm and dry.  Neurological:     Mental Status: She is alert and oriented to person, place, and time.     Deep Tendon Reflexes: Reflexes are normal and symmetric.  Psychiatric:        Mood and Affect: Mood normal.        Behavior: Behavior normal.        Thought Content: Thought content normal.        Judgment: Judgment normal.     MMSE is  28/30  ECG reveals only sinus tachycardia rate of about 104 with no ischemic ST-T wave changes No results found for any visits on 09/18/19.  Assessment & Plan     1. Essential (primary) hypertension Increase metoprolol from 50 mg to 100 mg daily.  - metoprolol succinate (TOPROL-XL) 100 MG 24 hr tablet; Take 1 tablet (100 mg total) by mouth daily. Take with or immediately following a meal.  Dispense: 30 tablet; Refill: 1  2. Tachycardia Sinus tachycardia.  Double metoprolol. - EKG  12-Lead  3. MCI (mild cognitive impairment) On Aricept 5.  She has neurology appointment later this month.  4. Thrombocytopenia (HCC)   5. Acquired hypothyroidism Follow labs.  6. Osteoporosis, unspecified osteoporosis type, unspecified pathological fracture presence Walk daily, vitamin D daily would calcium, BMD every 2 years  7. Avitaminosis D   8. Hyperlipidemia, unspecified hyperlipidemia type    No follow-ups on file.      I, [name], MD, have reviewed all documentation for this visit. The documentation on 09/19/19 for the exam, diagnosis, procedures, and orders are all accurate and complete.'    [name], MD  [facility] [phone] (phone) [phone] (fax)  [facility]

## 2019-09-14 ENCOUNTER — Other Ambulatory Visit: Payer: Self-pay | Admitting: Family Medicine

## 2019-09-14 DIAGNOSIS — I10 Essential (primary) hypertension: Secondary | ICD-10-CM

## 2019-09-14 NOTE — Telephone Encounter (Signed)
Requested Prescriptions  Pending Prescriptions Disp Refills  . losartan (COZAAR) 100 MG tablet [Pharmacy Med Name: LOSARTAN POTASSIUM 100 MG TAB] 90 tablet 3    Sig: TAKE 1 TABLET BY MOUTH EVERY DAY     Cardiovascular:  Angiotensin Receptor Blockers Failed - 09/14/2019  1:19 AM      Failed - Last BP in normal range    BP Readings from Last 1 Encounters:  08/28/19 (!) 210/76         Passed - Cr in normal range and within 180 days    Creatinine  Date Value Ref Range Status  07/22/2011 0.61 0.60 - 1.30 mg/dL Final   Creatinine, Ser  Date Value Ref Range Status  07/09/2019 0.56 0.44 - 1.00 mg/dL Final         Passed - K in normal range and within 180 days    Potassium  Date Value Ref Range Status  07/09/2019 3.5 3.5 - 5.1 mmol/L Final  07/22/2011 3.9 3.5 - 5.1 mmol/L Final         Passed - Patient is not pregnant      Passed - Valid encounter within last 6 months    Recent Outpatient Visits          2 weeks ago Mild cognitive impairment   [facility] [name], MD   2 months ago Pain and swelling of right lower leg   [facility] Flinchum, Eula Fried, FNP   9 months ago Annual physical exam   [facility] [name], MD   1 year ago Essential (primary) hypertension   [facility] [name], MD   1 year ago Essential (primary) hypertension   [facility] [name], MD      Future Appointments            In 4 days [name], MD [name]

## 2019-09-18 ENCOUNTER — Ambulatory Visit (INDEPENDENT_AMBULATORY_CARE_PROVIDER_SITE_OTHER): Payer: Medicare PPO | Admitting: Family Medicine

## 2019-09-18 ENCOUNTER — Other Ambulatory Visit: Payer: Self-pay

## 2019-09-18 ENCOUNTER — Encounter: Payer: Self-pay | Admitting: Family Medicine

## 2019-09-18 VITALS — BP 171/72 | HR 97 | Temp 97.8°F | Resp 16 | Ht 60.0 in | Wt 116.0 lb

## 2019-09-18 DIAGNOSIS — E785 Hyperlipidemia, unspecified: Secondary | ICD-10-CM | POA: Diagnosis not present

## 2019-09-18 DIAGNOSIS — R000 Tachycardia, unspecified: Secondary | ICD-10-CM | POA: Diagnosis not present

## 2019-09-18 DIAGNOSIS — G3184 Mild cognitive impairment, so stated: Secondary | ICD-10-CM | POA: Diagnosis not present

## 2019-09-18 DIAGNOSIS — I10 Essential (primary) hypertension: Secondary | ICD-10-CM | POA: Diagnosis not present

## 2019-09-18 DIAGNOSIS — D696 Thrombocytopenia, unspecified: Secondary | ICD-10-CM

## 2019-09-18 DIAGNOSIS — M810 Age-related osteoporosis without current pathological fracture: Secondary | ICD-10-CM | POA: Diagnosis not present

## 2019-09-18 DIAGNOSIS — E039 Hypothyroidism, unspecified: Secondary | ICD-10-CM | POA: Diagnosis not present

## 2019-09-18 DIAGNOSIS — E559 Vitamin D deficiency, unspecified: Secondary | ICD-10-CM

## 2019-09-18 MED ORDER — METOPROLOL SUCCINATE ER 100 MG PO TB24: 100.0000 mg | ORAL_TABLET | Freq: Every day | ORAL | 1 refills | Status: DC

## 2019-09-18 NOTE — Patient Instructions (Signed)
Increase metoprolol from 50 mg to 100 mg daily.

## 2019-09-19 ENCOUNTER — Other Ambulatory Visit: Payer: Self-pay | Admitting: Family Medicine

## 2019-09-19 DIAGNOSIS — G3184 Mild cognitive impairment, so stated: Secondary | ICD-10-CM

## 2019-10-08 ENCOUNTER — Ambulatory Visit: Payer: Self-pay | Admitting: Family Medicine

## 2019-10-09 NOTE — Progress Notes (Signed)
Established patient visit  I,[name],acting as a scribe for [name], MD.,have documented all relevant documentation on the behalf of [name], MD,as directed by  [name], MD while in the presence of [name], MD.   Patient: Lauren Lawrence   DOB: Dec 23, 1940   79 y.o. Female  MRN: [id number] Visit Date: 10/10/2019  Today's healthcare provider: [name], MD   Chief Complaint  Patient presents with  . Follow-up  . Hypertension   Subjective    HPI Patient comes in today for follow-up.  I am not sure she is taking all her medications as prescribed. Hypertension, follow-up  BP Readings from Last 3 Encounters:  10/10/19 137/62  09/18/19 (!) 171/72  08/28/19 (!) 210/76   Wt Readings from Last 3 Encounters:  10/10/19 116 lb (52.6 kg)  09/18/19 116 lb (52.6 kg)  08/28/19 119 lb (54 kg)     She was last seen for hypertension 3 weeks ago.  BP at that visit was 171/72. Management since that visit includes; Increased metoprolol from 50 mg to 100 mg daily.  She reports good compliance with treatment. She is not having side effects. none She is exercising. She is adherent to low salt diet.   Outside blood pressures are not checking.  She does not smoke.  Use of agents associated with hypertension: none.   --------------------------------------------------------------------      Medications: Outpatient Medications Prior to Visit  Medication Sig  . amLODipine (NORVASC) 5 MG tablet TAKE 1 TABLET BY MOUTH EVERY DAY  . Ascorbic Acid 500 MG CAPS Take by mouth daily.   . Cholecalciferol 1000 UNITS TBDP Take by mouth daily.   . clobetasol ointment (TEMOVATE) 0.05 %   . Coenzyme Q10 10 MG capsule Take 10 mg by mouth daily.  Marland Kitchen donepezil (ARICEPT) 5 MG tablet Take 1 tablet (5 mg total) by mouth at bedtime.  . fluticasone (FLONASE) 50 MCG/ACT nasal spray USE 1-2 SQUIRTS EACH NOSTRIL DAILY  . levothyroxine (SYNTHROID) 88 MCG  tablet TAKE 1 TABLET BY MOUTH EVERY DAY  . losartan (COZAAR) 100 MG tablet TAKE 1 TABLET BY MOUTH EVERY DAY  . Magnesium 400 MG CAPS Take by mouth daily.   . metoprolol succinate (TOPROL-XL) 100 MG 24 hr tablet Take 1 tablet (100 mg total) by mouth daily. Take with or immediately following a meal.  . Milk Thistle-Dand-Fennel-Licor (MILK THISTLE XTRA) CAPS Take by mouth daily.   . raloxifene (EVISTA) 60 MG tablet TAKE 1 TABLET BY MOUTH ONCE A DAY (Patient taking differently: TAKE 1 TABLET BY MOUTH ONCE A DAY (takes a couple times a week))  . traZODone (DESYREL) 50 MG tablet Take 0.5-1 tablets (25-50 mg total) by mouth at bedtime as needed for sleep.  Marland Kitchen triamcinolone cream (KENALOG) 0.1 %   . vitamin E 400 UNIT capsule Take 400 Units by mouth daily.   . cephALEXin (KEFLEX) 500 MG capsule Take 1 capsule (500 mg total) by mouth 3 (three) times daily. (Patient not taking: Reported on 08/28/2019)  . hydrochlorothiazide (HYDRODIURIL) 25 MG tablet TAKE 1 TABLET BY MOUTH EVERY DAY AS NEEDED (Patient not taking: Reported on 06/19/2019)   No facility-administered medications prior to visit.    Review of Systems  Constitutional: Negative for appetite change, chills, fatigue and fever.  HENT: Negative.   Eyes: Negative.   Respiratory: Negative for chest tightness and shortness of breath.   Cardiovascular: Negative for chest pain and palpitations.  Gastrointestinal: Negative  for abdominal pain, nausea and vomiting.  Endocrine: Negative.   Neurological: Negative for dizziness and weakness.       Some memory loss  Hematological: Negative.   Psychiatric/Behavioral: Negative.        Objective    BP 137/62 (BP Location: Right Arm, Patient Position: Sitting, Cuff Size: Normal)   Pulse 79   Temp (!) 97.1 F (36.2 C) (Other (Comment))   Resp 18   Ht 5' (1.524 m)   Wt 116 lb (52.6 kg)   SpO2 99%   BMI 22.65 kg/m  Wt Readings from Last 3 Encounters:  10/12/19 117 lb (53.1 kg)  10/10/19 116 lb (52.6  kg)  09/18/19 116 lb (52.6 kg)      Physical Exam    No results found for any visits on 10/10/19.  Assessment & Plan     1. Essential (primary) hypertension Blood pressure up last 2 visits but I am not sure patient is taking her meds.  2. Acquired hypothyroidism Check TSH.  May not be taking her meds - TSH 3.  [facility] AVS is telling patient to bring in her medications on next visit in 2 months. Return in 3 months (on 01/10/2020).      I, [name], MD, have reviewed all documentation for this visit. The documentation on 10/15/19 for the exam, diagnosis, procedures, and orders are all accurate and complete.    Makarios Madlock Cranford Mon, MD  [facility] [phone] (phone) [phone] (fax)  [place]

## 2019-10-10 ENCOUNTER — Ambulatory Visit (INDEPENDENT_AMBULATORY_CARE_PROVIDER_SITE_OTHER): Payer: Medicare PPO | Admitting: Family Medicine

## 2019-10-10 ENCOUNTER — Encounter: Payer: Self-pay | Admitting: Family Medicine

## 2019-10-10 ENCOUNTER — Other Ambulatory Visit: Payer: Self-pay

## 2019-10-10 VITALS — BP 137/62 | HR 79 | Temp 97.1°F | Resp 18 | Ht 60.0 in | Wt 116.0 lb

## 2019-10-10 DIAGNOSIS — E039 Hypothyroidism, unspecified: Secondary | ICD-10-CM

## 2019-10-10 DIAGNOSIS — G3184 Mild cognitive impairment, so stated: Secondary | ICD-10-CM | POA: Diagnosis not present

## 2019-10-10 DIAGNOSIS — K760 Fatty (change of) liver, not elsewhere classified: Secondary | ICD-10-CM | POA: Diagnosis not present

## 2019-10-10 DIAGNOSIS — I10 Essential (primary) hypertension: Secondary | ICD-10-CM

## 2019-10-10 NOTE — Patient Instructions (Signed)
On your next visit bring all your medications.

## 2019-10-11 ENCOUNTER — Telehealth: Payer: Self-pay

## 2019-10-11 LAB — TSH: TSH: 2.66 u[IU]/mL (ref 0.450–4.500)

## 2019-10-11 NOTE — Telephone Encounter (Signed)
-----   Message from [name], MD sent at 10/11/2019  7:43 AM EDT ----- Thyroid in normal range.  Same dose of Synthroid

## 2019-10-11 NOTE — Telephone Encounter (Signed)
Patient advised of lab results

## 2019-10-12 ENCOUNTER — Encounter: Payer: Self-pay | Admitting: Diagnostic Neuroimaging

## 2019-10-12 ENCOUNTER — Other Ambulatory Visit: Payer: Self-pay

## 2019-10-12 ENCOUNTER — Ambulatory Visit: Payer: Medicare PPO | Admitting: Diagnostic Neuroimaging

## 2019-10-12 VITALS — BP 139/75 | HR 85 | Ht 61.0 in | Wt 117.0 lb

## 2019-10-12 DIAGNOSIS — R413 Other amnesia: Secondary | ICD-10-CM

## 2019-10-12 NOTE — Patient Instructions (Signed)
MILD MEMORY LOSS - check MRI brain and B12

## 2019-10-12 NOTE — Progress Notes (Signed)
[facility]  PATIENT: Lauren Lawrence DOB: 1940/07/13  REFERRING CLINICIAN: [name],* HISTORY FROM: patient  REASON FOR VISIT: new consult    HISTORICAL  CHIEF COMPLAINT:  Chief Complaint  Patient presents with  . New Patient (Initial Visit)    memory concerns, pt states her children state she asks the same things over and over again and they are concerned. She says she doesn't think she holds onto things as long as she used to. Things she is trying to remember will come to her eventually but isn't sure if this is normal. MMSE 27/30 Animals 12.   Marland Kitchen Referral    [name], MD  . Room 6    here alone     HISTORY OF PRESENT ILLNESS:   79 year old female here for evaluation of memory loss.  Patient has been to some mild short-term memory loss problems but not that significant.  This is mainly noticed by her 2 daughters.  1 daughter lives in [place], who came to visit via speaker phone conversation.  Another daughter lives with patient but was at work today.  They are concerned about short-term memory loss, repeating herself, forgetting recent events, with symptoms starting around 2018.    Patient able to maintain her own personal hygiene, bathing, dressing, ADLs.  She is able to drive, shop, take care of bills and finances.  She has been having a little bit harder time managing complex financial transaction according to daughter.  She is able to manage her own medications.    REVIEW OF SYSTEMS: Full 14 system review of systems performed and negative with exception of: As per HPI.  ALLERGIES: No Known Allergies  HOME MEDICATIONS: Outpatient Medications Prior to Visit  Medication Sig Dispense Refill  . amLODipine (NORVASC) 5 MG tablet TAKE 1 TABLET BY MOUTH EVERY DAY 90 tablet 3  . Apoaequorin (PREVAGEN EXTRA STRENGTH PO) Take by mouth.    . Ascorbic Acid 500 MG CAPS Take by mouth daily.     . Cholecalciferol 1000 UNITS TBDP Take by  mouth daily.     . clobetasol ointment (TEMOVATE) 0.05 %     . Coenzyme Q10 10 MG capsule Take 10 mg by mouth daily.    Marland Kitchen donepezil (ARICEPT) 5 MG tablet Take 1 tablet (5 mg total) by mouth at bedtime. 30 tablet 1  . fluticasone (FLONASE) 50 MCG/ACT nasal spray USE 1-2 SQUIRTS EACH NOSTRIL DAILY (Patient taking differently: Takes as needed.) 16 g 12  . hydrochlorothiazide (HYDRODIURIL) 25 MG tablet TAKE 1 TABLET BY MOUTH EVERY DAY AS NEEDED 30 tablet 12  . levothyroxine (SYNTHROID) 88 MCG tablet TAKE 1 TABLET BY MOUTH EVERY DAY 90 tablet 1  . losartan (COZAAR) 100 MG tablet TAKE 1 TABLET BY MOUTH EVERY DAY 90 tablet 3  . Magnesium 400 MG CAPS Take by mouth daily.     . metoprolol succinate (TOPROL-XL) 100 MG 24 hr tablet Take 1 tablet (100 mg total) by mouth daily. Take with or immediately following a meal. 30 tablet 1  . Milk Thistle-Dand-Fennel-Licor (MILK THISTLE XTRA) CAPS Take by mouth daily.     . raloxifene (EVISTA) 60 MG tablet TAKE 1 TABLET BY MOUTH ONCE A DAY (Patient taking differently: TAKE 1 TABLET BY MOUTH ONCE A DAY (takes a couple times a week)) 90 tablet 3  . traZODone (DESYREL) 50 MG tablet Take 0.5-1 tablets (25-50 mg total) by mouth at bedtime as needed for sleep. 90 tablet 1  .  triamcinolone cream (KENALOG) 0.1 %     . vitamin E 400 UNIT capsule Take 400 Units by mouth daily.     . cephALEXin (KEFLEX) 500 MG capsule Take 1 capsule (500 mg total) by mouth 3 (three) times daily. (Patient not taking: Reported on 08/28/2019) 30 capsule 0   No facility-administered medications prior to visit.    PAST MEDICAL HISTORY: Past Medical History:  Diagnosis Date  . Depression   . Hyperlipidemia   . Hypertension     PAST SURGICAL HISTORY: History reviewed. No pertinent surgical history.  FAMILY HISTORY: Family History  Problem Relation Age of Onset  . Heart disease Mother   . Breast cancer Mother 59  . Dementia Father   . Hypertension Father   . Heart disease Father   .  Gout Father   . Heart disease Brother        MI with CABG X3  . Breast cancer Maternal Aunt 80    SOCIAL HISTORY: Social History   Socioeconomic History  . Marital status: Widowed    Spouse name: Laveda Abbe  . Number of children: 2  . Years of education: 16  . Highest education level: Bachelor's degree (e.g., BA, AB, BS)  Occupational History  . Occupation: retired  Tobacco Use  . Smoking status: Never Smoker  . Smokeless tobacco: Never Used  Vaping Use  . Vaping Use: Never used  Substance and Sexual Activity  . Alcohol use: No  . Drug use: No  . Sexual activity: Never  Other Topics Concern  . Not on file  Social History Narrative   Lives at home. One of her daughters lives with her.    Right handed   Caffeine: 1 cup/day   Social Determinants of Health   Financial Resource Strain: Low Risk   . Difficulty of Paying Living Expenses: Not hard at all  Food Insecurity: No Food Insecurity  . Worried About Charity fundraiser in the Last Year: Never true  . Ran Out of Food in the Last Year: Never true  Transportation Needs: No Transportation Needs  . Lack of Transportation (Medical): No  . Lack of Transportation (Non-Medical): No  Physical Activity: Inactive  . Days of Exercise per Week: 0 days  . Minutes of Exercise per Session: 0 min  Stress: No Stress Concern Present  . Feeling of Stress : Only a little  Social Connections: Moderately Integrated  . Frequency of Communication with Friends and Family: More than three times a week  . Frequency of Social Gatherings with Friends and Family: More than three times a week  . Attends Religious Services: More than 4 times per year  . Active Member of Clubs or Organizations: Yes  . Attends Archivist Meetings: More than 4 times per year  . Marital Status: Widowed  Intimate Partner Violence: Not At Risk  . Fear of Current or Ex-Partner: No  . Emotionally Abused: No  . Physically Abused: No  . Sexually Abused: No      PHYSICAL EXAM  GENERAL EXAM/CONSTITUTIONAL: Vitals:  Vitals:   10/12/19 0852  BP: 139/75  Pulse: 85  Weight: 117 lb (53.1 kg)  Height: 5\' 1"  (1.549 m)     Body mass index is 22.11 kg/m. Wt Readings from Last 3 Encounters:  10/12/19 117 lb (53.1 kg)  10/10/19 116 lb (52.6 kg)  09/18/19 116 lb (52.6 kg)     Patient is in no distress; well developed, nourished and groomed; neck is supple  CARDIOVASCULAR:  Examination of carotid arteries is normal; no carotid bruits  Regular rate and rhythm, no murmurs  Examination of peripheral vascular system by observation and palpation is normal  EYES:  Ophthalmoscopic exam of optic discs and posterior segments is normal; no papilledema or hemorrhages  No exam data present  MUSCULOSKELETAL:  Gait, strength, tone, movements noted in Neurologic exam below  NEUROLOGIC: MENTAL STATUS:  MMSE - Mini Mental State Exam 10/12/2019 08/28/2019  Orientation to time 5 5  Orientation to Place 4 5  Registration 3 2  Attention/ Calculation 5 5  Recall 2 2  Language- name 2 objects 2 2  Language- repeat 1 1  Language- follow 3 step command 3 3  Language- read & follow direction 1 1  Write a sentence 1 1  Copy design 0 1  Total score 27 28    awake, alert, oriented to person, place and time  recent and remote memory intact  normal attention and concentration  language fluent, comprehension intact, naming intact  fund of knowledge appropriate  CRANIAL NERVE:   2nd - no papilledema on fundoscopic exam  2nd, 3rd, 4th, 6th - pupils equal and reactive to light, visual fields full to confrontation, extraocular muscles intact, no nystagmus  5th - facial sensation symmetric  7th - facial strength symmetric  8th - hearing intact  9th - palate elevates symmetrically, uvula midline  11th - shoulder shrug symmetric  12th - tongue protrusion midline  MOTOR:   normal bulk and tone, full strength in the BUE,  BLE  SENSORY:   normal and symmetric to light touch, temperature, vibration  COORDINATION:   finger-nose-finger, fine finger movements normal  REFLEXES:   deep tendon reflexes present and symmetric  GAIT/STATION:   narrow based gait     DIAGNOSTIC DATA (LABS, IMAGING, TESTING) - I reviewed patient records, labs, notes, testing and imaging myself where available.  Lab Results  Component Value Date   WBC 3.8 (L) 07/09/2019   HGB 13.3 07/09/2019   HCT 39.9 07/09/2019   MCV 93.0 07/09/2019   PLT 82 (L) 07/09/2019      Component Value Date/Time   NA 136 07/09/2019 1634   NA 137 12/12/2018 1556   NA 137 07/22/2011 0952   K 3.5 07/09/2019 1634   K 3.9 07/22/2011 0952   CL 101 07/09/2019 1634   CL 98 07/22/2011 0952   CO2 27 07/09/2019 1634   CO2 31 07/22/2011 0952   GLUCOSE 119 (H) 07/09/2019 1634   GLUCOSE 76 07/22/2011 0952   BUN 12 07/09/2019 1634   BUN 13 12/12/2018 1556   BUN 9 07/22/2011 0952   CREATININE 0.56 07/09/2019 1634   CREATININE 0.61 07/22/2011 0952   CALCIUM 9.4 07/09/2019 1634   CALCIUM 9.2 07/22/2011 0952   PROT 6.9 07/09/2019 1634   PROT 6.6 12/12/2018 1556   PROT 7.4 04/06/2012 0941   ALBUMIN 4.2 07/09/2019 1634   ALBUMIN 4.3 12/12/2018 1556   ALBUMIN 4.0 04/06/2012 0941   AST 41 07/09/2019 1634   AST 45 (H) 04/06/2012 0941   ALT 30 07/09/2019 1634   ALT 51 04/06/2012 0941   ALKPHOS 59 07/09/2019 1634   ALKPHOS 63 04/06/2012 0941   BILITOT 1.0 07/09/2019 1634   BILITOT 0.9 12/12/2018 1556   BILITOT 0.6 04/06/2012 0941   GFRNONAA >60 07/09/2019 1634   GFRNONAA >60 07/22/2011 0952   GFRAA >60 07/09/2019 1634   GFRAA >60 07/22/2011 0952   Lab Results  Component Value Date  CHOL 149 12/12/2018   HDL 58 12/12/2018   LDLCALC 75 12/12/2018   TRIG 82 12/12/2018   CHOLHDL 2.6 12/12/2018   No results found for: HGBA1C No results found for: VITAMINB12 Lab Results  Component Value Date   TSH 2.660 10/10/2019      ASSESSMENT  AND PLAN  79 y.o. year old female here with mild surgery memory loss problems since 2018, may represent mild cognitive impairment.  Question of whether some ADLs are trying to change or not is unclear.  Will check MRI brain and lab testing, monitor symptoms.  Advised to optimize nutrition, exercise, brain stimulating activities.  Role of donepezil or memantine at this time is unclear.  Dx:  1. Memory loss     PLAN:  MILD MEMORY LOSS (no major change in ADLs; possible MCI vs mild dementia) - likely MCI; check MRI brain and B12  Orders Placed This Encounter  Procedures  . MR BRAIN W WO CONTRAST  . Vitamin B12   Return in about 9 months (around 07/11/2020).    Suanne Marker, MD 10/12/2019, 9:26 AM Certified in Neurology, Neurophysiology and Neuroimaging  [facility] [address] [place], [place] [address] [phone]

## 2019-10-13 LAB — VITAMIN B12: Vitamin B-12: 564 pg/mL (ref 232–1245)

## 2019-10-14 ENCOUNTER — Other Ambulatory Visit: Payer: Self-pay | Admitting: Family Medicine

## 2019-10-14 DIAGNOSIS — I10 Essential (primary) hypertension: Secondary | ICD-10-CM

## 2019-10-14 NOTE — Telephone Encounter (Signed)
Requested Prescriptions  Pending Prescriptions Disp Refills  . metoprolol succinate (TOPROL-XL) 100 MG 24 hr tablet [Pharmacy Med Name: METOPROLOL SUCC ER 100 MG TAB] 90 tablet 1    Sig: TAKE 1 TABLET (100 MG TOTAL) BY MOUTH DAILY. TAKE WITH OR IMMEDIATELY FOLLOWING A MEAL.     Cardiovascular:  Beta Blockers Passed - 10/14/2019 10:31 AM      Passed - Last BP in normal range    BP Readings from Last 1 Encounters:  10/12/19 139/75         Passed - Last Heart Rate in normal range    Pulse Readings from Last 1 Encounters:  10/12/19 85         Passed - Valid encounter within last 6 months    Recent Outpatient Visits          4 days ago Essential (primary) hypertension   [facility] [name], MD   3 weeks ago Essential (primary) hypertension   [facility] [name], MD   1 month ago Mild cognitive impairment   [facility] [name], MD   3 months ago Pain and swelling of right lower leg   [facility] Flinchum, Eula Fried, FNP   10 months ago Annual physical exam   [facility] [name], MD      Future Appointments            In 2 months [name], MD [name]

## 2019-10-15 ENCOUNTER — Telehealth: Payer: Self-pay | Admitting: Diagnostic Neuroimaging

## 2019-10-15 NOTE — Telephone Encounter (Signed)
Francine Graven Berkley Harvey: [id number] (exp. 10/15/19 to 11/14/19) order sent to GI. They will reach out to the patient to schedule.

## 2019-10-16 ENCOUNTER — Telehealth: Payer: Self-pay | Admitting: *Deleted

## 2019-10-16 NOTE — Telephone Encounter (Signed)
Called mobile #, no answer, voice MB not set up. Called home #, no answer, no VM. If patient calls back phone staff may tell her vitamin B12 lab result was normal.

## 2019-10-17 ENCOUNTER — Other Ambulatory Visit: Payer: Self-pay

## 2019-10-17 ENCOUNTER — Encounter: Payer: Self-pay | Admitting: *Deleted

## 2019-10-17 ENCOUNTER — Ambulatory Visit
Admission: RE | Admit: 2019-10-17 | Discharge: 2019-10-17 | Disposition: A | Discharge status: Home / Self Care | Payer: Medicare PPO | Source: Ambulatory Visit | Attending: Diagnostic Neuroimaging | Admitting: Diagnostic Neuroimaging

## 2019-10-17 DIAGNOSIS — R413 Other amnesia: Secondary | ICD-10-CM | POA: Diagnosis not present

## 2019-10-17 MED ORDER — GADOBENATE DIMEGLUMINE 529 MG/ML IV SOLN
10.0000 mL | Freq: Once | INTRAVENOUS | Status: AC | PRN
  Administered 2019-10-17: 10 mL via INTRAVENOUS

## 2019-10-21 ENCOUNTER — Other Ambulatory Visit: Payer: Self-pay | Admitting: Family Medicine

## 2019-10-21 DIAGNOSIS — G3184 Mild cognitive impairment, so stated: Secondary | ICD-10-CM

## 2019-10-21 NOTE — Telephone Encounter (Signed)
Requested Prescriptions  Pending Prescriptions Disp Refills  . donepezil (ARICEPT) 5 MG tablet [Pharmacy Med Name: DONEPEZIL HCL 5 MG TABLET] 90 tablet 1    Sig: TAKE 1 TABLET BY MOUTH EVERYDAY AT BEDTIME     Neurology:  Alzheimer's Agents Passed - 10/21/2019 10:31 AM      Passed - Valid encounter within last 6 months    Recent Outpatient Visits          1 week ago Essential (primary) hypertension   [facility] [name], MD   1 month ago Essential (primary) hypertension   [facility] [name], MD   1 month ago Mild cognitive impairment   [facility] [name], MD   3 months ago Pain and swelling of right lower leg   [facility] Flinchum, Eula Fried, FNP   10 months ago Annual physical exam   [facility] [name], MD      Future Appointments            In 2 months [name], MD [name]

## 2019-10-23 ENCOUNTER — Telehealth: Payer: Self-pay | Admitting: *Deleted

## 2019-10-23 ENCOUNTER — Telehealth: Payer: Self-pay | Admitting: Diagnostic Neuroimaging

## 2019-10-23 NOTE — Telephone Encounter (Signed)
Called home #, phone continuously rang. Called mobile #, no answer, voice MB not set up.

## 2019-10-23 NOTE — Telephone Encounter (Signed)
Pt's daughter Maudry Mayhew on [place] called stating that she would like to be called when the results of the pt's MRI come in. She will be in town soon with the pt.

## 2019-10-24 NOTE — Telephone Encounter (Addendum)
Received yesterday is separate phone note: Pt's daughter Maudry Mayhew on [place] called stating that she would like to be called when the results of the pt's MRI come in. She will be in town soon with the pt. Pt's daughter Maudry Mayhew on [place] called stating that she would like to be called when the results of the pt's MRI come in. She will be in town soon with the pt. Called daughter, Kenney Houseman and informed her thE MRI brain showed unremarkable imaging results. Advised her Vit B12 level is good. Discussed difference between MCI and dementia, answered her questions to her stated satisfaction. Kenney Houseman  Will give her mother the results, verbalized understanding, appreciation.

## 2019-10-24 NOTE — Telephone Encounter (Signed)
See earlier  phone note dated 10/23/19.

## 2019-11-07 DIAGNOSIS — H25813 Combined forms of age-related cataract, bilateral: Secondary | ICD-10-CM | POA: Diagnosis not present

## 2019-12-13 ENCOUNTER — Encounter: Payer: Self-pay | Admitting: Family Medicine

## 2020-01-08 NOTE — Progress Notes (Signed)
Lauren Lawrence,acting as a scribe for Lauren Mans, MD.,have documented all relevant documentation on the behalf of Lauren Mans, MD,as directed by  Lauren Mans, MD while in the presence of Lauren Mans, MD.   Established patient visit   Patient: Lauren Lawrence   DOB: [birth year]   79 y.o. Female  MRN: [id number] Visit Date: 01/10/2020  Today's healthcare provider: [name], MD   Chief Complaint  Patient presents with  . Follow-up  . Hypertension   Subjective    HPI  Overall patient feels well and has no complaints today.  She has been widowed for 3 years.  Her one daughter lives with her another daughter is in [place].  We reviewed her medications in depth today. Hypertension, follow-up  BP Readings from Last 3 Encounters:  01/10/20 127/70  10/12/19 139/75  10/10/19 137/62   Wt Readings from Last 3 Encounters:  01/10/20 118 lb (53.5 kg)  10/12/19 117 lb (53.1 kg)  10/10/19 116 lb (52.6 kg)     She was last seen for hypertension 3 months ago.  BP at that visit was 137/62. Management since that visit includes; Blood pressure up last 2 visits but I am not sure patient is taking her meds. She reports good compliance with treatment. She is not having side effects. none She is exercising. She is not adherent to low salt diet.   Outside blood pressures are checks occasionally.  She does not smoke.  Use of agents associated with hypertension: none.   --------------------------------------------------------------------  Hypothyroid, follow-up  Lab Results  Component Value Date   TSH 2.660 10/10/2019   TSH 2.410 12/12/2018   TSH 0.790 02/23/2018   Wt Readings from Last 3 Encounters:  01/10/20 118 lb (53.5 kg)  10/12/19 117 lb (53.1 kg)  10/10/19 116 lb (52.6 kg)    She was last seen for hypothyroid 3 months ago.  Management since that visit includes; labs checked showing-Thyroid in normal range. Same dose of Synthroid. She  reports good compliance with treatment. She is not having side effects. none  --------------------------------------------------------------------  MCI From 10/10/2019-[facility] is telling patient to bring in her medications on next visit in 2 months.      Medications: Outpatient Medications Prior to Visit  Medication Sig  . amLODipine (NORVASC) 5 MG tablet TAKE 1 TABLET BY MOUTH EVERY DAY  . Apoaequorin (PREVAGEN EXTRA STRENGTH PO) Take by mouth.  . Ascorbic Acid 500 MG CAPS Take by mouth daily.   . Cholecalciferol 1000 UNITS TBDP Take by mouth daily.   . clobetasol ointment (TEMOVATE) 0.05 %   . Coenzyme Q10 10 MG capsule Take 10 mg by mouth daily.  . fluticasone (FLONASE) 50 MCG/ACT nasal spray USE 1-2 SQUIRTS EACH NOSTRIL DAILY (Patient taking differently: Takes as needed.)  . hydrochlorothiazide (HYDRODIURIL) 25 MG tablet TAKE 1 TABLET BY MOUTH EVERY DAY AS NEEDED  . levothyroxine (SYNTHROID) 88 MCG tablet TAKE 1 TABLET BY MOUTH EVERY DAY  . losartan (COZAAR) 100 MG tablet TAKE 1 TABLET BY MOUTH EVERY DAY  . Magnesium 400 MG CAPS Take by mouth daily.   . metoprolol succinate (TOPROL-XL) 100 MG 24 hr tablet TAKE 1 TABLET (100 MG TOTAL) BY MOUTH DAILY. TAKE WITH OR IMMEDIATELY FOLLOWING A MEAL.  . Milk Thistle-Dand-Fennel-Licor (MILK THISTLE XTRA) CAPS Take by mouth daily.   . raloxifene (EVISTA) 60 MG tablet TAKE 1 TABLET BY MOUTH ONCE A DAY (Patient taking differently: TAKE 1 TABLET BY MOUTH ONCE A  DAY (takes a couple times a week))  . traZODone (DESYREL) 50 MG tablet Take 0.5-1 tablets (25-50 mg total) by mouth at bedtime as needed for sleep.  Marland Kitchen triamcinolone cream (KENALOG) 0.1 %   . vitamin E 400 UNIT capsule Take 400 Units by mouth daily.   . [DISCONTINUED] donepezil (ARICEPT) 5 MG tablet TAKE 1 TABLET BY MOUTH EVERYDAY AT BEDTIME   No facility-administered medications prior to visit.    Review of Systems  Constitutional: Negative for appetite change, chills, fatigue and  fever.  Respiratory: Negative for chest tightness and shortness of breath.   Cardiovascular: Negative for chest pain and palpitations.  Gastrointestinal: Negative for abdominal pain, nausea and vomiting.  Neurological: Negative for dizziness and weakness.      Objective    BP 127/70 (BP Location: Right Arm, Patient Position: Sitting, Cuff Size: Normal)   Pulse 65   Temp 97.9 F (36.6 C) (Oral)   Resp 16   Ht 5\' 1"  (1.549 m)   Wt 118 lb (53.5 kg)   SpO2 100%   BMI 22.30 kg/m   Physical Exam Vitals reviewed.  Constitutional:      Appearance: She is well-developed.  HENT:     Head: Normocephalic and atraumatic.     Right Ear: External ear normal.     Left Ear: External ear normal.     Nose: Nose normal.  Eyes:     General: No scleral icterus.    Conjunctiva/sclera: Conjunctivae normal.     Pupils: Pupils are equal, round, and reactive to light.  Neck:     Thyroid: No thyromegaly.     Comments: Small stable nodule isthmus of thyroid. Cardiovascular:     Rate and Rhythm: Normal rate and regular rhythm.     Heart sounds: Normal heart sounds.  Pulmonary:     Effort: Pulmonary effort is normal.     Breath sounds: Normal breath sounds.  Abdominal:     General: Bowel sounds are normal.     Palpations: Abdomen is soft.  Musculoskeletal:        General: Normal range of motion.     Cervical back: Normal range of motion and neck supple.  Skin:    General: Skin is warm and dry.  Neurological:     Mental Status: She is alert and oriented to person, place, and time.     Deep Tendon Reflexes: Reflexes are normal and symmetric.  Psychiatric:        Mood and Affect: Mood normal.        Behavior: Behavior normal.        Thought Content: Thought content normal.        Judgment: Judgment normal.     BP 127/70 (BP Location: Right Arm, Patient Position: Sitting, Cuff Size: Normal)   Pulse 65   Temp 97.9 F (36.6 C) (Oral)   Resp 16   Ht 5\' 1"  (1.549 m)   Wt 118 lb (53.5 kg)    SpO2 100%   BMI 22.30 kg/m                                                                   No results found for any visits on 01/10/20.  Assessment & Plan  1. Essential (primary) hypertension   2. Acquired hypothyroidism   3. MCI (mild cognitive impairment)   4. Need for influenza vaccination  - Flu Vaccine QUAD High Dose(Fluad)  5. Mild cognitive impairment Increased donepezil from 5 mg to 10 mg daily. Will get MMSE on next visit. - donepezil (ARICEPT) 10 MG tablet; Take 1 tablet (10 mg total) by mouth at bedtime.  Dispense: 90 tablet; Refill: 1 At this time will stop coq.10 and vitamin C daily.  Advised patient to get COVID booster within the next couple of months.  Return in about 5 months (around 06/11/2020).         Richard Wendelyn Breslow, MD  [facility] [phone] (phone) [phone] (fax)  [facility]

## 2020-01-10 ENCOUNTER — Encounter: Payer: Self-pay | Admitting: Family Medicine

## 2020-01-10 ENCOUNTER — Other Ambulatory Visit: Payer: Self-pay

## 2020-01-10 ENCOUNTER — Ambulatory Visit: Payer: Medicare PPO | Admitting: Family Medicine

## 2020-01-10 VITALS — BP 127/70 | HR 65 | Temp 97.9°F | Resp 16 | Ht 61.0 in | Wt 118.0 lb

## 2020-01-10 DIAGNOSIS — Z23 Encounter for immunization: Secondary | ICD-10-CM

## 2020-01-10 DIAGNOSIS — I10 Essential (primary) hypertension: Secondary | ICD-10-CM

## 2020-01-10 DIAGNOSIS — G3184 Mild cognitive impairment, so stated: Secondary | ICD-10-CM | POA: Diagnosis not present

## 2020-01-10 DIAGNOSIS — E039 Hypothyroidism, unspecified: Secondary | ICD-10-CM

## 2020-01-10 MED ORDER — DONEPEZIL HCL 10 MG PO TABS: 10.0000 mg | ORAL_TABLET | Freq: Every day | ORAL | 1 refills | Status: DC

## 2020-01-10 NOTE — Patient Instructions (Addendum)
Stop CoQ-10 and Vitamin D. Get 3rd Covid booster vaccine in one month.

## 2020-02-13 ENCOUNTER — Other Ambulatory Visit: Payer: Self-pay | Admitting: Family Medicine

## 2020-02-19 DIAGNOSIS — H2513 Age-related nuclear cataract, bilateral: Secondary | ICD-10-CM | POA: Diagnosis not present

## 2020-02-19 DIAGNOSIS — H18413 Arcus senilis, bilateral: Secondary | ICD-10-CM | POA: Diagnosis not present

## 2020-02-19 DIAGNOSIS — H25043 Posterior subcapsular polar age-related cataract, bilateral: Secondary | ICD-10-CM | POA: Diagnosis not present

## 2020-02-19 DIAGNOSIS — H2512 Age-related nuclear cataract, left eye: Secondary | ICD-10-CM | POA: Diagnosis not present

## 2020-02-19 DIAGNOSIS — H25013 Cortical age-related cataract, bilateral: Secondary | ICD-10-CM | POA: Diagnosis not present

## 2020-03-10 DIAGNOSIS — H2512 Age-related nuclear cataract, left eye: Secondary | ICD-10-CM | POA: Diagnosis not present

## 2020-03-11 DIAGNOSIS — H2511 Age-related nuclear cataract, right eye: Secondary | ICD-10-CM | POA: Diagnosis not present

## 2020-03-31 DIAGNOSIS — H2511 Age-related nuclear cataract, right eye: Secondary | ICD-10-CM | POA: Diagnosis not present

## 2020-04-06 ENCOUNTER — Other Ambulatory Visit: Payer: Self-pay | Admitting: Family Medicine

## 2020-04-06 DIAGNOSIS — I10 Essential (primary) hypertension: Secondary | ICD-10-CM

## 2020-04-28 DIAGNOSIS — Z961 Presence of intraocular lens: Secondary | ICD-10-CM | POA: Diagnosis not present

## 2020-05-23 DIAGNOSIS — L900 Lichen sclerosus et atrophicus: Secondary | ICD-10-CM | POA: Diagnosis not present

## 2020-05-23 DIAGNOSIS — L821 Other seborrheic keratosis: Secondary | ICD-10-CM | POA: Diagnosis not present

## 2020-06-10 ENCOUNTER — Other Ambulatory Visit: Payer: Self-pay | Admitting: Family Medicine

## 2020-06-10 DIAGNOSIS — I10 Essential (primary) hypertension: Secondary | ICD-10-CM

## 2020-06-12 ENCOUNTER — Ambulatory Visit: Payer: Self-pay | Admitting: Family Medicine

## 2020-06-23 NOTE — Progress Notes (Unsigned)
Subjective:   Lauren Lawrence is a 80 y.o. female who presents for Medicare Annual (Subsequent) preventive examination.  I connected with Oleta Kopplin today by telephone and verified that I am speaking with the correct person using two identifiers. Location patient: home Location provider: work Persons participating in the virtual visit: patient, provider.   I discussed the limitations, risks, security and privacy concerns of performing an evaluation and management service by telephone and the availability of in person appointments. I also discussed with the patient that there may be a patient responsible charge related to this service. The patient expressed understanding and verbally consented to this telephonic visit.    Interactive audio and video telecommunications were attempted between this provider and patient, however failed, due to patient having technical difficulties OR patient did not have access to video capability.  We continued and completed visit with audio only.   Review of Systems    N/A  Cardiac Risk Factors include: advanced age (>3055men, 82>65 women);dyslipidemia;hypertension     Objective:    There were no vitals filed for this visit. There is no height or weight on file to calculate BMI.  Advanced Directives 06/24/2020 06/19/2019 06/14/2018 06/08/2017 02/17/2016 07/14/2015 03/10/2015  Does Patient Have a Medical Advance Directive? Yes Yes Yes No Yes Yes No;Yes  Type of Estate agentAdvance Directive Healthcare Power of [place];Living will Healthcare Power of [place];Living will Healthcare Power of [place];Living will - - - -  Does patient want to make changes to medical advance directive? - - - Yes (MAU/Ambulatory/Procedural Areas - Information given) No - Patient declined - -  Copy of Healthcare Power of Attorney in Chart? No - copy requested No - copy requested No - copy requested - No - copy requested - -    Current Medications (verified) Outpatient Encounter Medications as of  06/24/2020  Medication Sig  . amLODipine (NORVASC) 5 MG tablet TAKE 1 TABLET BY MOUTH EVERY DAY  . Apoaequorin (PREVAGEN EXTRA STRENGTH PO) Take 1 tablet by mouth daily.  . Ascorbic Acid 500 MG CAPS Take by mouth daily.   . Cholecalciferol 1000 UNITS TBDP Take 2,000 Units by mouth daily.  . Coenzyme Q10 10 MG capsule Take 10 mg by mouth daily.  Marland Kitchen. donepezil (ARICEPT) 10 MG tablet Take 1 tablet (10 mg total) by mouth at bedtime.  . hydrochlorothiazide (HYDRODIURIL) 25 MG tablet TAKE 1 TABLET BY MOUTH EVERY DAY AS NEEDED  . levothyroxine (SYNTHROID) 88 MCG tablet TAKE 1 TABLET BY MOUTH EVERY DAY  . losartan (COZAAR) 100 MG tablet TAKE 1 TABLET BY MOUTH EVERY DAY  . Magnesium 400 MG CAPS Take 250 mg by mouth daily.  . metoprolol succinate (TOPROL-XL) 100 MG 24 hr tablet TAKE 1 TABLET (100 MG TOTAL) BY MOUTH DAILY. TAKE WITH OR IMMEDIATELY FOLLOWING A MEAL.  . raloxifene (EVISTA) 60 MG tablet TAKE 1 TABLET BY MOUTH ONCE A DAY  . traZODone (DESYREL) 50 MG tablet Take 0.5-1 tablets (25-50 mg total) by mouth at bedtime as needed for sleep.  . vitamin E 400 UNIT capsule Take 400 Units by mouth daily.   . clobetasol ointment (TEMOVATE) 0.05 %  (Patient not taking: Reported on 06/24/2020)  . fluticasone (FLONASE) 50 MCG/ACT nasal spray USE 1-2 SQUIRTS EACH NOSTRIL DAILY (Patient not taking: Reported on 06/24/2020)  . Milk Thistle-Dand-Fennel-Licor (MILK THISTLE XTRA) CAPS Take by mouth daily.  (Patient not taking: Reported on 06/24/2020)  . triamcinolone cream (KENALOG) 0.1 %  (Patient not taking: Reported on 06/24/2020)  No facility-administered encounter medications on file as of 06/24/2020.    Allergies (verified) Patient has no known allergies.   History: Past Medical History:  Diagnosis Date  . Depression   . Hyperlipidemia   . Hypertension    History reviewed. No pertinent surgical history. Family History  Problem Relation Age of Onset  . Heart disease Mother   . Breast cancer Mother 79  .  Dementia Father   . Hypertension Father   . Heart disease Father   . Gout Father   . Heart disease Brother        MI with CABG X3  . Breast cancer Maternal Aunt 14   Social History   Socioeconomic History  . Marital status: Widowed    Spouse name: Alfredo Bach  . Number of children: 2  . Years of education: 16  . Highest education level: Bachelor's degree (e.g., BA, AB, BS)  Occupational History  . Occupation: retired  Tobacco Use  . Smoking status: Never Smoker  . Smokeless tobacco: Never Used  Vaping Use  . Vaping Use: Never used  Substance and Sexual Activity  . Alcohol use: No  . Drug use: No  . Sexual activity: Never  Other Topics Concern  . Not on file  Social History Narrative   Lives at home. One of her daughters lives with her.    Right handed   Caffeine: 1 cup/day   Social Determinants of Health   Financial Resource Strain: Low Risk   . Difficulty of Paying Living Expenses: Not hard at all  Food Insecurity: No Food Insecurity  . Worried About Programme researcher, broadcasting/film/video in the Last Year: Never true  . Ran Out of Food in the Last Year: Never true  Transportation Needs: No Transportation Needs  . Lack of Transportation (Medical): No  . Lack of Transportation (Non-Medical): No  Physical Activity: Inactive  . Days of Exercise per Week: 0 days  . Minutes of Exercise per Session: 0 min  Stress: No Stress Concern Present  . Feeling of Stress : Not at all  Social Connections: Moderately Integrated  . Frequency of Communication with Friends and Family: More than three times a week  . Frequency of Social Gatherings with Friends and Family: More than three times a week  . Attends Religious Services: More than 4 times per year  . Active Member of Clubs or Organizations: Yes  . Attends Banker Meetings: More than 4 times per year  . Marital Status: Widowed    Tobacco Counseling Counseling given: Not Answered   Clinical Intake:  Pre-visit preparation  completed: Yes  Pain : No/denies pain     Nutritional Risks: None Diabetes: No  How often do you need to have someone help you when you read instructions, pamphlets, or other written materials from your doctor or pharmacy?: 1 - Never  Diabetic? No  Interpreter Needed?: No  Information entered by :: [facility], LPN   Activities of Daily Living In your present state of health, do you have any difficulty performing the following activities: 06/24/2020 01/10/2020  Hearing? N N  Vision? N N  Difficulty concentrating or making decisions? N Y  Walking or climbing stairs? N N  Dressing or bathing? N N  Doing errands, shopping? N N  Preparing Food and eating ? N -  Using the Toilet? N -  In the past six months, have you accidently leaked urine? N -  Do you have problems with loss of bowel control? N -  Managing your Medications? N -  Managing your Finances? N -  Housekeeping or managing your Housekeeping? N -  Some recent data might be hidden    Patient Care Team: [name], MD as PCP - General (Family Medicine) Irene Limbo., MD as Consulting Physician (Ophthalmology) Debbrah Alar, MD (Dermatology)  Indicate any recent Medical Services you may have received from other than [facility] providers in the past year (date may be approximate).     Assessment:   This is a routine wellness examination for Lauren Lawrence.  Hearing/Vision screen No exam data present  Dietary issues and exercise activities discussed: Current Exercise Habits: Home exercise routine, Type of exercise: walking, Time (Minutes): 20, Frequency (Times/Week): 7, Weekly Exercise (Minutes/Week): 140, Intensity: Mild, Exercise limited by: None identified  Goals    . DIET - INCREASE WATER INTAKE     Recommend increasing water intake to 6-8 8 oz glasses a day.       Depression Screen PHQ 2/9 Scores 06/24/2020 01/10/2020 06/19/2019 12/12/2018 12/12/2018 06/14/2018 06/14/2018  PHQ - 2 Score 0 0 0 0 0 0 0  PHQ- 9  Score - 1 - 1 - - 0    Fall Risk Fall Risk  06/24/2020 01/10/2020 10/12/2019 06/19/2019 12/12/2018  Falls in the past year? 0 0 0 0 0  Number falls in past yr: 0 0 - 0 -  Injury with Fall? 0 0 - 0 -  Follow up - Falls evaluation completed - - -    FALL RISK PREVENTION PERTAINING TO THE HOME:  Any stairs in or around the home? Yes  If so, are there any without handrails? No  Home free of loose throw rugs in walkways, pet beds, electrical cords, etc? Yes  Adequate lighting in your home to reduce risk of falls? Yes   ASSISTIVE DEVICES UTILIZED TO PREVENT FALLS:  Life alert? No  Use of a cane, walker or w/c? No  Grab bars in the bathroom? Yes  Shower chair or bench in shower? No  Elevated toilet seat or a handicapped toilet? Yes    Cognitive Function:  MMSE - Mini Mental State Exam 10/12/2019 08/28/2019  Orientation to time 5 5  Orientation to Place 4 5  Registration 3 2  Attention/ Calculation 5 5  Recall 2 2  Language- name 2 objects 2 2  Language- repeat 1 1  Language- follow 3 step command 3 3  Language- read & follow direction 1 1  Write a sentence 1 1  Copy design 0 1  Total score 27 28     6CIT Screen 06/19/2019 06/08/2017 02/17/2016 01/07/2016  What Year? 0 points 0 points 0 points 0 points  What month? 0 points 0 points 0 points 0 points  What time? 0 points 0 points 0 points 0 points  Count back from 20 0 points 0 points 0 points 0 points  Months in reverse 2 points 0 points 0 points 2 points  Repeat phrase 2 points 0 points 0 points 0 points  Total Score 4 0 0 2    Immunizations Immunization History  Administered Date(s) Administered  . Fluad Quad(high Dose 65+) 12/12/2018, 01/10/2020  . Hepatitis B, adult 03/04/2011, 04/08/2011  . Influenza Split 02/24/2012  . Influenza, High Dose Seasonal PF 02/19/2014, 02/04/2015, 01/07/2016, 06/08/2017, 02/23/2018  . Moderna Sars-Covid-2 Vaccination 05/11/2019, 06/08/2019, 04/29/2020  . Pneumococcal Conjugate-13 05/21/2014   . Pneumococcal Polysaccharide-23 06/29/2011  . Tdap 01/03/2007    TDAP status: Due, Education has been  provided regarding the importance of this vaccine. Advised may receive this vaccine at local pharmacy or Health Dept. Aware to provide a copy of the vaccination record if obtained from local pharmacy or Health Dept. Verbalized acceptance and understanding.  Flu Vaccine status: Up to date  Pneumococcal vaccine status: Up to date  Covid-19 vaccine status: Completed vaccines  Qualifies for Shingles Vaccine? Yes   Zostavax completed No   Shingrix Completed?: No.    Education has been provided regarding the importance of this vaccine. Patient has been advised to call insurance company to determine out of pocket expense if they have not yet received this vaccine. Advised may also receive vaccine at local pharmacy or Health Dept. Verbalized acceptance and understanding.  Screening Tests Health Maintenance  Topic Date Due  . DEXA SCAN  08/18/2019  . TETANUS/TDAP  06/24/2021 (Originally 01/02/2017)  . INFLUENZA VACCINE  Completed  . COVID-19 Vaccine  Completed  . PNA vac Low Risk Adult  Completed  . HPV VACCINES  Aged Out    Health Maintenance  Health Maintenance Due  Topic Date Due  . DEXA SCAN  08/18/2019    Colorectal cancer screening: No longer required.   Mammogram status: No longer required due to age.   Bone Density status: Ordered today. Pt provided with contact info and advised to call to schedule appt.  Lung Cancer Screening: (Low Dose CT Chest recommended if Age 64-80 years, 30 pack-year currently smoking OR have quit w/in 15years.) does not qualify.   Additional Screening:  Vision Screening: Recommended annual ophthalmology exams for early detection of glaucoma and other disorders of the eye. Is the patient up to date with their annual eye exam?  Yes  Who is the provider or what is the name of the office in which the patient attends annual eye exams? Dr Alvester Morin  If pt  is not established with a provider, would they like to be referred to a provider to establish care? No .   Dental Screening: Recommended annual dental exams for proper oral hygiene  Community Resource Referral / Chronic Care Management: CRR required this visit?  No   CCM required this visit?  No      Plan:     I have personally reviewed and noted the following in the patient's chart:   . Medical and social history . Use of alcohol, tobacco or illicit drugs  . Current medications and supplements . Functional ability and status . Nutritional status . Physical activity . Advanced directives . List of other physicians . Hospitalizations, surgeries, and ER visits in previous 12 months . Vitals . Screenings to include cognitive, depression, and falls . Referrals and appointments  In addition, I have reviewed and discussed with patient certain preventive protocols, quality metrics, and best practice recommendations. A written personalized care plan for preventive services as well as general preventive health recommendations were provided to patient.     McKenzie Robbins, [place]   12/23/930   Nurse Notes: None.

## 2020-06-24 ENCOUNTER — Other Ambulatory Visit: Payer: Self-pay

## 2020-06-24 ENCOUNTER — Ambulatory Visit (INDEPENDENT_AMBULATORY_CARE_PROVIDER_SITE_OTHER): Payer: Medicare PPO

## 2020-06-24 DIAGNOSIS — Z0000 Encounter for general adult medical examination without abnormal findings: Secondary | ICD-10-CM

## 2020-06-24 DIAGNOSIS — M810 Age-related osteoporosis without current pathological fracture: Secondary | ICD-10-CM

## 2020-06-24 NOTE — Patient Instructions (Signed)
Lauren Lawrence , Thank you for taking time to come for your Medicare Wellness Visit. I appreciate your ongoing commitment to your health goals. Please review the following plan we discussed and let me know if I can assist you in the future.   Screening recommendations/referrals: Colonoscopy: No longer required.  Mammogram: No longer required.  Bone Density: Ordered today. Pt aware office will contact her to schedule apt.  Recommended yearly ophthalmology/optometry visit for glaucoma screening and checkup Recommended yearly dental visit for hygiene and checkup  Vaccinations: Influenza vaccine: Done 01/10/20 Pneumococcal vaccine: Completed series Tdap vaccine: Currently due, declined receiving. Shingles vaccine: Shingrix discussed. Please contact your pharmacy for coverage information.     Advanced directives: Please bring a copy of your POA (Power of Attorney) and/or Living Will to your next appointment.   Conditions/risks identified: Recommend increasing water intake to 6-8 8 oz glasses a day.   Next appointment: 06/25/20 @ 10:20 AM with Dr Sullivan Lone    Preventive Care 65 Years and Older, Female Preventive care refers to lifestyle choices and visits with your health care provider that can promote health and wellness. What does preventive care include?  A yearly physical exam. This is also called an annual well check.  Dental exams once or twice a year.  Routine eye exams. Ask your health care provider how often you should have your eyes checked.  Personal lifestyle choices, including:  Daily care of your teeth and gums.  Regular physical activity.  Eating a healthy diet.  Avoiding tobacco and drug use.  Limiting alcohol use.  Practicing safe sex.  Taking low-dose aspirin every day.  Taking vitamin and mineral supplements as recommended by your health care provider. What happens during an annual well check? The services and screenings done by your health care provider during  your annual well check will depend on your age, overall health, lifestyle risk factors, and family history of disease. Counseling  Your health care provider may ask you questions about your:  Alcohol use.  Tobacco use.  Drug use.  Emotional well-being.  Home and relationship well-being.  Sexual activity.  Eating habits.  History of falls.  Memory and ability to understand (cognition).  Work and work Astronomer.  Reproductive health. Screening  You may have the following tests or measurements:  Height, weight, and BMI.  Blood pressure.  Lipid and cholesterol levels. These may be checked every 5 years, or more frequently if you are over 60 years old.  Skin check.  Lung cancer screening. You may have this screening every year starting at age 23 if you have a 30-pack-year history of smoking and currently smoke or have quit within the past 15 years.  Fecal occult blood test (FOBT) of the stool. You may have this test every year starting at age 25.  Flexible sigmoidoscopy or colonoscopy. You may have a sigmoidoscopy every 5 years or a colonoscopy every 10 years starting at age 69.  Hepatitis C blood test.  Hepatitis B blood test.  Sexually transmitted disease (STD) testing.  Diabetes screening. This is done by checking your blood sugar (glucose) after you have not eaten for a while (fasting). You may have this done every 1-3 years.  Bone density scan. This is done to screen for osteoporosis. You may have this done starting at age 44.  Mammogram. This may be done every 1-2 years. Talk to your health care provider about how often you should have regular mammograms. Talk with your health care provider about your test  results, treatment options, and if necessary, the need for more tests. Vaccines  Your health care provider may recommend certain vaccines, such as:  Influenza vaccine. This is recommended every year.  Tetanus, diphtheria, and acellular pertussis (Tdap,  Td) vaccine. You may need a Td booster every 10 years.  Zoster vaccine. You may need this after age 68.  Pneumococcal 13-valent conjugate (PCV13) vaccine. One dose is recommended after age 48.  Pneumococcal polysaccharide (PPSV23) vaccine. One dose is recommended after age 72. Talk to your health care provider about which screenings and vaccines you need and how often you need them. This information is not intended to replace advice given to you by your health care provider. Make sure you discuss any questions you have with your health care provider. Document Released: 05/02/2015 Document Revised: 12/24/2015 Document Reviewed: 02/04/2015 Elsevier Interactive Patient Education  2017 [employer] Prevention in the Home Falls can cause injuries. They can happen to people of all ages. There are many things you can do to make your home safe and to help prevent falls. What can I do on the outside of my home?  Regularly fix the edges of walkways and driveways and fix any cracks.  Remove anything that might make you trip as you walk through a door, such as a raised step or threshold.  Trim any bushes or trees on the path to your home.  Use bright outdoor lighting.  Clear any walking paths of anything that might make someone trip, such as rocks or tools.  Regularly check to see if handrails are loose or broken. Make sure that both sides of any steps have handrails.  Any raised decks and porches should have guardrails on the edges.  Have any leaves, snow, or ice cleared regularly.  Use sand or salt on walking paths during winter.  Clean up any spills in your garage right away. This includes oil or grease spills. What can I do in the bathroom?  Use night lights.  Install grab bars by the toilet and in the tub and shower. Do not use towel bars as grab bars.  Use non-skid mats or decals in the tub or shower.  If you need to sit down in the shower, use a plastic, non-slip  stool.  Keep the floor dry. Clean up any water that spills on the floor as soon as it happens.  Remove soap buildup in the tub or shower regularly.  Attach bath mats securely with double-sided non-slip rug tape.  Do not have throw rugs and other things on the floor that can make you trip. What can I do in the bedroom?  Use night lights.  Make sure that you have a light by your bed that is easy to reach.  Do not use any sheets or blankets that are too big for your bed. They should not hang down onto the floor.  Have a firm chair that has side arms. You can use this for support while you get dressed.  Do not have throw rugs and other things on the floor that can make you trip. What can I do in the kitchen?  Clean up any spills right away.  Avoid walking on wet floors.  Keep items that you use a lot in easy-to-reach places.  If you need to reach something above you, use a strong step stool that has a grab bar.  Keep electrical cords out of the way.  Do not use floor polish or wax that makes  floors slippery. If you must use wax, use non-skid floor wax.  Do not have throw rugs and other things on the floor that can make you trip. What can I do with my stairs?  Do not leave any items on the stairs.  Make sure that there are handrails on both sides of the stairs and use them. Fix handrails that are broken or loose. Make sure that handrails are as long as the stairways.  Check any carpeting to make sure that it is firmly attached to the stairs. Fix any carpet that is loose or worn.  Avoid having throw rugs at the top or bottom of the stairs. If you do have throw rugs, attach them to the floor with carpet tape.  Make sure that you have a light switch at the top of the stairs and the bottom of the stairs. If you do not have them, ask someone to add them for you. What else can I do to help prevent falls?  Wear shoes that:  Do not have high heels.  Have rubber bottoms.  Are  comfortable and fit you well.  Are closed at the toe. Do not wear sandals.  If you use a stepladder:  Make sure that it is fully opened. Do not climb a closed stepladder.  Make sure that both sides of the stepladder are locked into place.  Ask someone to hold it for you, if possible.  Clearly mark and make sure that you can see:  Any grab bars or handrails.  First and last steps.  Where the edge of each step is.  Use tools that help you move around (mobility aids) if they are needed. These include:  Canes.  Walkers.  Scooters.  Crutches.  Turn on the lights when you go into a dark area. Replace any light bulbs as soon as they burn out.  Set up your furniture so you have a clear path. Avoid moving your furniture around.  If any of your floors are uneven, fix them.  If there are any pets around you, be aware of where they are.  Review your medicines with your doctor. Some medicines can make you feel dizzy. This can increase your chance of falling. Ask your doctor what other things that you can do to help prevent falls. This information is not intended to replace advice given to you by your health care provider. Make sure you discuss any questions you have with your health care provider. Document Released: 01/30/2009 Document Revised: 09/11/2015 Document Reviewed: 05/10/2014 Elsevier Interactive Patient Education  2017 [employer].

## 2020-06-25 ENCOUNTER — Ambulatory Visit: Payer: Medicare PPO | Admitting: Family Medicine

## 2020-06-25 ENCOUNTER — Encounter: Payer: Self-pay | Admitting: Family Medicine

## 2020-06-25 ENCOUNTER — Other Ambulatory Visit: Payer: Self-pay

## 2020-06-25 VITALS — BP 114/57 | HR 57 | Temp 98.0°F | Resp 16 | Ht 60.0 in | Wt 117.0 lb

## 2020-06-25 DIAGNOSIS — I10 Essential (primary) hypertension: Secondary | ICD-10-CM | POA: Diagnosis not present

## 2020-06-25 DIAGNOSIS — M810 Age-related osteoporosis without current pathological fracture: Secondary | ICD-10-CM

## 2020-06-25 DIAGNOSIS — G3184 Mild cognitive impairment, so stated: Secondary | ICD-10-CM | POA: Diagnosis not present

## 2020-06-25 NOTE — Patient Instructions (Signed)
Ok to stop Vitamin E and Vitamin C over the counter.

## 2020-06-25 NOTE — Progress Notes (Signed)
Established patient visit   Patient: Lauren Lawrence   DOB: 03-26-1941   80 y.o. Female  MRN: [id number] Visit Date: 06/25/2020  Today's healthcare provider: [name], MD   Chief Complaint  Patient presents with  . Hypertension  . Mild Cognitive Impairment    Subjective    HPI  Patient comes in today for follow-up.  She was doing fairly well.  Patient's daughter now lives with her. She states her memory loss is slowly getting worse. Hypertension, follow-up  BP Readings from Last 3 Encounters:  06/25/20 (!) 114/57  01/10/20 127/70  10/12/19 139/75   Wt Readings from Last 3 Encounters:  06/25/20 117 lb (53.1 kg)  01/10/20 118 lb (53.5 kg)  10/12/19 117 lb (53.1 kg)     She was last seen for hypertension 6 months ago.  BP at that visit was 127/70. Management since that visit includes; on amlodipine, losartan, and metoprolol. She reports good compliance with treatment. She is not having side effects.  She is not exercising. She is adherent to low salt diet.   Outside blood pressures are checked occasionally.  She does not smoke.  Use of agents associated with hypertension: none.   Mild cognitive impairment From 01/10/2020-Increased donepezil from 5 mg to 10 mg daily. Will get MMSE on next visit. At this time will stop coq.10 and vitamin C daily.  Advised patient to get COVID booster within the next couple of months.  MMSE - Mini Mental State Exam 06/25/2020 10/12/2019 08/28/2019  Orientation to time 3 5 5   Orientation to Place 5 4 5   Registration 3 3 2   Attention/ Calculation 5 5 5   Recall 0 2 2  Language- name 2 objects 2 2 2   Language- repeat 1 1 1   Language- follow 3 step command 3 3 3   Language- read & follow direction 1 1 1   Write a sentence 1 1 1   Copy design 0 0 1  Total score 24 27 28         Medications: Outpatient Medications Prior to Visit  Medication Sig  . amLODipine (NORVASC) 5 MG tablet TAKE 1 TABLET BY MOUTH EVERY DAY  .  Apoaequorin (PREVAGEN EXTRA STRENGTH PO) Take 1 tablet by mouth daily.  . Ascorbic Acid 500 MG CAPS Take by mouth daily.   . Cholecalciferol 1000 UNITS TBDP Take 2,000 Units by mouth daily.  . clobetasol ointment (TEMOVATE) 0.05 %  (Patient not taking: Reported on 06/24/2020)  . Coenzyme Q10 10 MG capsule Take 10 mg by mouth daily.  donepezil (ARICEPT) 10 MG tablet Take 1 tablet (10 mg total) by mouth at bedtime.  . fluticasone (FLONASE) 50 MCG/ACT nasal spray USE 1-2 SQUIRTS EACH NOSTRIL DAILY (Patient not taking: Reported on 06/24/2020)  . hydrochlorothiazide (HYDRODIURIL) 25 MG tablet TAKE 1 TABLET BY MOUTH EVERY DAY AS NEEDED  . levothyroxine (SYNTHROID) 88 MCG tablet TAKE 1 TABLET BY MOUTH EVERY DAY  . losartan (COZAAR) 100 MG tablet TAKE 1 TABLET BY MOUTH EVERY DAY  . Magnesium 400 MG CAPS Take 250 mg by mouth daily.  . metoprolol succinate (TOPROL-XL) 100 MG 24 hr tablet TAKE 1 TABLET (100 MG TOTAL) BY MOUTH DAILY. TAKE WITH OR IMMEDIATELY FOLLOWING A MEAL.  . Milk Thistle-Dand-Fennel-Licor (MILK THISTLE XTRA) CAPS Take by mouth daily.  (Patient not taking: Reported on 06/24/2020)  . raloxifene (EVISTA) 60 MG tablet TAKE 1 TABLET BY MOUTH ONCE A DAY  . traZODone (DESYREL) 50 MG tablet Take  0.5-1 tablets (25-50 mg total) by mouth at bedtime as needed for sleep.  Marland Kitchen triamcinolone cream (KENALOG) 0.1 %  (Patient not taking: Reported on 06/24/2020)  . vitamin E 400 UNIT capsule Take 400 Units by mouth daily.    No facility-administered medications prior to visit.    Review of Systems  Constitutional: Negative for appetite change, chills, fatigue and fever.  Respiratory: Negative for chest tightness and shortness of breath.   Cardiovascular: Negative for chest pain and palpitations.  Gastrointestinal: Negative for abdominal pain, nausea and vomiting.  Neurological: Negative for dizziness and weakness.        Objective    BP (!) 114/57   Pulse (!) 57   Temp 98 F (36.7 C)   Resp 16    Ht 5' (1.524 m)   Wt 117 lb (53.1 kg)   BMI 22.85 kg/m  BP Readings from Last 3 Encounters:  06/25/20 (!) 114/57  01/10/20 127/70  10/12/19 139/75   Wt Readings from Last 3 Encounters:  06/25/20 117 lb (53.1 kg)  01/10/20 118 lb (53.5 kg)  10/12/19 117 lb (53.1 kg)       Physical Exam Vitals reviewed.  Constitutional:      Appearance: She is well-developed.  HENT:     Head: Normocephalic and atraumatic.     Right Ear: External ear normal.     Left Ear: External ear normal.     Nose: Nose normal.  Eyes:     General: No scleral icterus.    Conjunctiva/sclera: Conjunctivae normal.     Pupils: Pupils are equal, round, and reactive to light.  Neck:     Thyroid: No thyromegaly.     Comments: Small stable nodule isthmus of thyroid. Cardiovascular:     Rate and Rhythm: Normal rate and regular rhythm.     Heart sounds: Normal heart sounds.  Pulmonary:     Effort: Pulmonary effort is normal.     Breath sounds: Normal breath sounds.  Abdominal:     General: Bowel sounds are normal.     Palpations: Abdomen is soft.  Musculoskeletal:     Cervical back: Normal range of motion and neck supple.  Skin:    General: Skin is warm and dry.  Neurological:     Mental Status: She is alert and oriented to person, place, and time.     Deep Tendon Reflexes: Reflexes are normal and symmetric.  Psychiatric:        Mood and Affect: Mood normal.        Behavior: Behavior normal.        Thought Content: Thought content normal.        Judgment: Judgment normal.       No results found for any visits on 06/25/20.  Assessment & Plan     1. MCI (mild cognitive impairment) MMSE slowly declining with 24/30 today. Convenience have patient stop over-the-counter vitamin C vitamin E at this time. 2. Osteoporosis, unspecified osteoporosis type, unspecified pathological fracture presence Check BMD.  Treat as appropriate. - DG Bone Density; Future  3. Essential (primary) hypertension Good  control on amlodipine HydroDIURIL losartan and metoprolol   No follow-ups on file.      I, Lauren Mans, MD, have reviewed all documentation for this visit. The documentation on 06/27/20 for the exam, diagnosis, procedures, and orders are all accurate and complete.    Lauren Hellenbrand Wendelyn Breslow, MD  [facility] [phone] (phone) [phone] (fax)  [facility]

## 2020-07-07 ENCOUNTER — Ambulatory Visit: Payer: Medicare PPO | Admitting: Diagnostic Neuroimaging

## 2020-07-15 ENCOUNTER — Other Ambulatory Visit: Payer: Self-pay | Admitting: Family Medicine

## 2020-07-15 DIAGNOSIS — G3184 Mild cognitive impairment, so stated: Secondary | ICD-10-CM

## 2020-08-11 ENCOUNTER — Ambulatory Visit: Payer: Medicare PPO | Admitting: Diagnostic Neuroimaging

## 2020-08-11 ENCOUNTER — Encounter: Payer: Self-pay | Admitting: Diagnostic Neuroimaging

## 2020-08-11 ENCOUNTER — Other Ambulatory Visit: Payer: Self-pay | Admitting: Family Medicine

## 2020-08-11 VITALS — BP 121/59 | HR 50 | Ht 61.0 in | Wt 116.6 lb

## 2020-08-11 DIAGNOSIS — R413 Other amnesia: Secondary | ICD-10-CM

## 2020-08-11 NOTE — Progress Notes (Signed)
[facility]  PATIENT: Lauren Lawrence DOB: [birth year]  REFERRING CLINICIAN: [name],* HISTORY FROM: patient  REASON FOR VISIT: follow up   HISTORICAL  CHIEF COMPLAINT:  Chief Complaint  Patient presents with  . Memory Loss    Rm 7, 9 month FU    HISTORY OF PRESENT ILLNESS:   UPDATE (08/11/20, VRP): Since last visit, doing well, but memory sxs slightly progressed. More short term memory loss issues especially due to recent conversations of events. Able to maintain personal ADLs at home, also able to drive to the pharmacy and other familiar locations on her own.  Patient still lives with daughter.  She mainly goes outside of the home with her daughter and other people.  PRIOR HPI: 80 year old female here for evaluation of memory loss.  Patient has been to some mild short-term memory loss problems but not that significant.  This is mainly noticed by her 2 daughters.  1 daughter lives in [place], who came to visit via speaker phone conversation.  Another daughter lives with patient but was at work today.  They are concerned about short-term memory loss, repeating herself, forgetting recent events, with symptoms starting around 2018.    Patient able to maintain her own personal hygiene, bathing, dressing, ADLs.  She is able to drive, shop, take care of bills and finances.  She has been having a little bit harder time managing complex financial transaction according to daughter.  She is able to manage her own medications.    REVIEW OF SYSTEMS: Full 14 system review of systems performed and negative with exception of: As per HPI.  ALLERGIES: No Known Allergies  HOME MEDICATIONS: Outpatient Medications Prior to Visit  Medication Sig Dispense Refill  . amLODipine (NORVASC) 5 MG tablet TAKE 1 TABLET BY MOUTH EVERY DAY 90 tablet 3  . Coenzyme Q10 10 MG capsule Take 10 mg by mouth daily.    . hydrochlorothiazide (HYDRODIURIL) 25 MG tablet TAKE 1 TABLET  BY MOUTH EVERY DAY AS NEEDED 30 tablet 12  . levothyroxine (SYNTHROID) 88 MCG tablet TAKE 1 TABLET BY MOUTH EVERY DAY 90 tablet 1  . losartan (COZAAR) 100 MG tablet TAKE 1 TABLET BY MOUTH EVERY DAY 90 tablet 3  . metoprolol succinate (TOPROL-XL) 100 MG 24 hr tablet TAKE 1 TABLET (100 MG TOTAL) BY MOUTH DAILY. TAKE WITH OR IMMEDIATELY FOLLOWING A MEAL. 90 tablet 1  . raloxifene (EVISTA) 60 MG tablet TAKE 1 TABLET BY MOUTH ONCE A DAY 90 tablet 3  . Apoaequorin (PREVAGEN EXTRA STRENGTH PO) Take 1 tablet by mouth daily. (Patient not taking: Reported on 08/11/2020)    . Ascorbic Acid 500 MG CAPS Take by mouth daily.  (Patient not taking: Reported on 08/11/2020)    . Cholecalciferol 1000 UNITS TBDP Take 2,000 Units by mouth daily. (Patient not taking: Reported on 08/11/2020)    . clobetasol ointment (TEMOVATE) 0.05 %  (Patient not taking: No sig reported)    . donepezil (ARICEPT) 10 MG tablet TAKE 1 TABLET BY MOUTH EVERYDAY AT BEDTIME 90 tablet 1  . fluticasone (FLONASE) 50 MCG/ACT nasal spray USE 1-2 SQUIRTS EACH NOSTRIL DAILY (Patient not taking: No sig reported) 16 g 12  . Magnesium 400 MG CAPS Take 250 mg by mouth daily. (Patient not taking: Reported on 08/11/2020)    . Milk Thistle-Dand-Fennel-Licor (MILK THISTLE XTRA) CAPS Take by mouth daily.  (Patient not taking: No sig reported)    . traZODone (DESYREL) 50 MG tablet Take 0.5-1 tablets (  25-50 mg total) by mouth at bedtime as needed for sleep. (Patient not taking: Reported on 08/11/2020) 90 tablet 1  . triamcinolone cream (KENALOG) 0.1 %  (Patient not taking: No sig reported)    . vitamin E 400 UNIT capsule Take 400 Units by mouth daily.  (Patient not taking: Reported on 08/11/2020)     No facility-administered medications prior to visit.    PAST MEDICAL HISTORY: Past Medical History:  Diagnosis Date  . Depression   . Hyperlipidemia   . Hypertension     PAST SURGICAL HISTORY: No past surgical history on file.  FAMILY HISTORY: Family  History  Problem Relation Age of Onset  . Heart disease Mother   . Breast cancer Mother 550  . Dementia Father   . Hypertension Father   . Heart disease Father   . Gout Father   . Heart disease Brother        MI with CABG X3  . Breast cancer Maternal Aunt [phone]    SOCIAL HISTORY: Social History   Socioeconomic History  . Marital status: Widowed    Spouse name: Alfredo BachCecil  . Number of children: 2  . Years of education: 16  . Highest education level: Bachelor's degree (e.g., BA, AB, BS)  Occupational History  . Occupation: retired  Tobacco Use  . Smoking status: Never Smoker  . Smokeless tobacco: Never Used  Vaping Use  . Vaping Use: Never used  Substance and Sexual Activity  . Alcohol use: No  . Drug use: No  . Sexual activity: Never  Other Topics Concern  . Not on file  Social History Narrative   08/11/20 Lives at home. One of her daughters, Rosey Batheresa lives with her.    Right handed   Caffeine: 1 cup/day   Social Determinants of Health   Financial Resource Strain: Low Risk   . Difficulty of Paying Living Expenses: Not hard at all  Food Insecurity: No Food Insecurity  . Worried About Programme researcher, broadcasting/film/videounning Out of Food in the Last Year: Never true  . Ran Out of Food in the Last Year: Never true  Transportation Needs: No Transportation Needs  . Lack of Transportation (Medical): No  . Lack of Transportation (Non-Medical): No  Physical Activity: Inactive  . Days of Exercise per Week: 0 days  . Minutes of Exercise per Session: 0 min  Stress: No Stress Concern Present  . Feeling of Stress : Not at all  Social Connections: Moderately Integrated  . Frequency of Communication with Friends and Family: More than three times a week  . Frequency of Social Gatherings with Friends and Family: More than three times a week  . Attends Religious Services: More than 4 times per year  . Active Member of Clubs or Organizations: Yes  . Attends BankerClub or Organization Meetings: More than 4 times per year  .  Marital Status: Widowed  Intimate Partner Violence: Not At Risk  . Fear of Current or Ex-Partner: No  . Emotionally Abused: No  . Physically Abused: No  . Sexually Abused: No     PHYSICAL EXAM  GENERAL EXAM/CONSTITUTIONAL: Vitals:  Vitals:   08/11/20 1342  BP: (!) 121/59  Pulse: (!) 50  Weight: 116 lb 9.6 oz (52.9 kg)  Height: 5\' 1"  (1.549 m)   Body mass index is 22.03 kg/m. Wt Readings from Last 3 Encounters:  08/11/20 116 lb 9.6 oz (52.9 kg)  06/25/20 117 lb (53.1 kg)  01/10/20 118 lb (53.5 kg)    Patient is in  no distress; well developed, nourished and groomed; neck is supple  CARDIOVASCULAR:  Examination of carotid arteries is normal; no carotid bruits  Regular rate and rhythm, no murmurs  Examination of peripheral vascular system by observation and palpation is normal  EYES:  Ophthalmoscopic exam of optic discs and posterior segments is normal; no papilledema or hemorrhages No exam data present  MUSCULOSKELETAL:  Gait, strength, tone, movements noted in Neurologic exam below  NEUROLOGIC: MENTAL STATUS:  MMSE - Mini Mental State Exam 08/11/2020 06/25/2020 10/12/2019  Orientation to time 4 3 5   Orientation to Place 2 5 4   Registration 3 3 3   Attention/ Calculation 3 5 5   Recall 2 0 2  Language- name 2 objects 2 2 2   Language- repeat 1 1 1   Language- follow 3 step command 3 3 3   Language- read & follow direction 1 1 1   Write a sentence 1 1 1   Copy design 1 0 0  Total score 23 24 27     awake, alert, oriented to person, place and time  recent and remote memory intact  normal attention and concentration  language fluent, comprehension intact, naming intact  fund of knowledge appropriate  CRANIAL NERVE:   2nd - no papilledema on fundoscopic exam  2nd, 3rd, 4th, 6th - pupils equal and reactive to light, visual fields full to confrontation, extraocular muscles intact, no nystagmus  5th - facial sensation symmetric  7th - facial strength  symmetric  8th - hearing intact  9th - palate elevates symmetrically, uvula midline  11th - shoulder shrug symmetric  12th - tongue protrusion midline  MOTOR:   normal bulk and tone, full strength in the BUE, BLE  SENSORY:   normal and symmetric to light touch, temperature, vibration  COORDINATION:   finger-nose-finger, fine finger movements normal  REFLEXES:   deep tendon reflexes present and symmetric  GAIT/STATION:   narrow based gait     DIAGNOSTIC DATA (LABS, IMAGING, TESTING) - I reviewed patient records, labs, notes, testing and imaging myself where available.  Lab Results  Component Value Date   WBC 3.8 (L) 07/09/2019   HGB 13.3 07/09/2019   HCT 39.9 07/09/2019   MCV 93.0 07/09/2019   PLT 82 (L) 07/09/2019      Component Value Date/Time   NA 136 07/09/2019 1634   NA 137 12/12/2018 1556   NA 137 07/22/2011 0952   K 3.5 07/09/2019 1634   K 3.9 07/22/2011 0952   CL 101 07/09/2019 1634   CL 98 07/22/2011 0952   CO2 27 07/09/2019 1634   CO2 31 07/22/2011 0952   GLUCOSE 119 (H) 07/09/2019 1634   GLUCOSE 76 07/22/2011 0952   BUN 12 07/09/2019 1634   BUN 13 12/12/2018 1556   BUN 9 07/22/2011 0952   CREATININE 0.56 07/09/2019 1634   CREATININE 0.61 07/22/2011 0952   CALCIUM 9.4 07/09/2019 1634   CALCIUM 9.2 07/22/2011 0952   PROT 6.9 07/09/2019 1634   PROT 6.6 12/12/2018 1556   PROT 7.4 04/06/2012 0941   ALBUMIN 4.2 07/09/2019 1634   ALBUMIN 4.3 12/12/2018 1556   ALBUMIN 4.0 04/06/2012 0941   AST 41 07/09/2019 1634   AST 45 (H) 04/06/2012 0941   ALT 30 07/09/2019 1634   ALT 51 04/06/2012 0941   ALKPHOS 59 07/09/2019 1634   ALKPHOS 63 04/06/2012 0941   BILITOT 1.0 07/09/2019 1634   BILITOT 0.9 12/12/2018 1556   BILITOT 0.6 04/06/2012 0941   GFRNONAA >60 07/09/2019 1634   GFRNONAA >  60 07/22/2011 0952   GFRAA >60 07/09/2019 1634   GFRAA >60 07/22/2011 0952   Lab Results  Component Value Date   CHOL 149 12/12/2018   HDL 58 12/12/2018    LDLCALC 75 12/12/2018   TRIG 82 12/12/2018   CHOLHDL 2.6 12/12/2018   No results found for: HGBA1C Lab Results  Component Value Date   VITAMINB12 564 10/12/2019   Lab Results  Component Value Date   TSH 2.660 10/10/2019    10/17/19 MRI brain - Unremarkable MRI brain (with and without). No acute findings.   ASSESSMENT AND PLAN  80 y.o. year old female here with mild surgery memory loss problems since 2018, may represent mild cognitive impairment vs mild dementia.   Dx:  1. Memory loss     PLAN:  MILD MEMORY LOSS (mild changes in ADLs; possible mild dementia) - safety / supervision issues reviewed - daily physical activity / exercise (at least 15-30 minutes) - eat more plants / vegetables - increase social activities, brain stimulation, games, puzzles, hobbies, crafts, arts, music - aim for at least 7-8 hours sleep per night (or more) - avoid smoking and alcohol - caution with medications, finances, driving  Return for return to PCP, pending if symptoms worsen or fail to improve.    Suanne Marker, MD 08/11/2020, 1:43 PM Certified in Neurology, Neurophysiology and Neuroimaging  [facility] [address] [place], [place] [address] [phone]

## 2020-08-11 NOTE — Patient Instructions (Signed)
MILD MEMORY LOSS (mild changes in ADLs; possible mild cognitive impairment vs mild dementia) - safety / supervision issues reviewed - daily physical activity / exercise (at least 15-30 minutes) - eat more plants / vegetables - increase social activities, brain stimulation, games, puzzles, hobbies, crafts, arts, music - aim for at least 7-8 hours sleep per night (or more) - avoid smoking and alcohol - caution with medications, finances, driving

## 2020-08-13 ENCOUNTER — Ambulatory Visit
Admission: RE | Admit: 2020-08-13 | Discharge: 2020-08-13 | Disposition: A | Discharge status: Home / Self Care | Payer: Medicare PPO | Source: Ambulatory Visit | Attending: Family Medicine | Admitting: Family Medicine

## 2020-08-13 ENCOUNTER — Other Ambulatory Visit: Payer: Self-pay

## 2020-08-13 DIAGNOSIS — M810 Age-related osteoporosis without current pathological fracture: Secondary | ICD-10-CM | POA: Diagnosis not present

## 2020-08-13 DIAGNOSIS — Z780 Asymptomatic menopausal state: Secondary | ICD-10-CM | POA: Diagnosis not present

## 2020-08-22 ENCOUNTER — Other Ambulatory Visit: Payer: Self-pay | Admitting: Family Medicine

## 2020-08-22 DIAGNOSIS — I10 Essential (primary) hypertension: Secondary | ICD-10-CM

## 2020-09-03 ENCOUNTER — Other Ambulatory Visit: Payer: Self-pay | Admitting: Family Medicine

## 2020-09-03 DIAGNOSIS — I10 Essential (primary) hypertension: Secondary | ICD-10-CM

## 2020-09-18 ENCOUNTER — Other Ambulatory Visit: Payer: Self-pay | Admitting: Family Medicine

## 2020-09-18 DIAGNOSIS — I10 Essential (primary) hypertension: Secondary | ICD-10-CM

## 2020-12-10 ENCOUNTER — Other Ambulatory Visit: Payer: Self-pay | Admitting: Family Medicine

## 2020-12-10 DIAGNOSIS — I10 Essential (primary) hypertension: Secondary | ICD-10-CM

## 2020-12-26 NOTE — Progress Notes (Signed)
Established patient visit   Patient: Lauren Lawrence   DOB: 08/20/1940   80 y.o. Female  MRN: [id number] Visit Date: 12/29/2020  Today's healthcare provider: [name], MD   Chief Complaint  Patient presents with   Hypertension   Subjective    HPI  Patient comes in today for follow-up.  She has been feeling fairly well.  She is tolerating her medication well and her MCI is slowly progressive but not become a problem yet per patient/family.  She is able to function with her ADLs. Hypertension, follow-up  BP Readings from Last 3 Encounters:  12/29/20 (!) 126/42  08/11/20 (!) 121/59  06/25/20 (!) 114/57   Wt Readings from Last 3 Encounters:  12/29/20 117 lb (53.1 kg)  08/11/20 116 lb 9.6 oz (52.9 kg)  06/25/20 117 lb (53.1 kg)     She was last seen for hypertension 6 months ago.  BP at that visit was 114/57. Management since that visit includes no medication changes.  She reports good compliance with treatment. She is having side effects. Patient reports that she has had ankle swelling.  She is following a Regular diet. She is not exercising. She does not smoke.  Use of agents associated with hypertension: none.   Outside blood pressures are checked daily. Symptoms: No chest pain No chest pressure  No palpitations No syncope  No dyspnea No orthopnea  No paroxysmal nocturnal dyspnea No lower extremity edema   Pertinent labs: Lab Results  Component Value Date   CHOL 149 12/12/2018   HDL 58 12/12/2018   LDLCALC 75 12/12/2018   TRIG 82 12/12/2018   CHOLHDL 2.6 12/12/2018   Lab Results  Component Value Date   NA 136 07/09/2019   K 3.5 07/09/2019   CREATININE 0.56 07/09/2019   GFRNONAA >60 07/09/2019   GFRAA >60 07/09/2019   GLUCOSE 119 (H) 07/09/2019     The ASCVD Risk score (Arnett DK, et al., 2019) failed to calculate for the following reasons:   The 2019 ASCVD risk score is only valid for ages 64 to 24    --------------------------------------------------------------------------------------------------- Follow up for MCI  The patient was last seen for this 6 months ago. Changes made at last visit include no medication changes.  She reports good compliance with treatment. She feels that condition is Unchanged. She is not having side effects.      Medications: Outpatient Medications Prior to Visit  Medication Sig   amLODipine (NORVASC) 5 MG tablet TAKE 1 TABLET BY MOUTH EVERY DAY   Apoaequorin (PREVAGEN EXTRA STRENGTH PO) Take 1 tablet by mouth daily. (Patient not taking: Reported on 08/11/2020)   Ascorbic Acid 500 MG CAPS Take by mouth daily.  (Patient not taking: Reported on 08/11/2020)   Cholecalciferol 1000 UNITS TBDP Take 2,000 Units by mouth daily. (Patient not taking: Reported on 08/11/2020)   clobetasol ointment (TEMOVATE) 0.05 %  (Patient not taking: No sig reported)   Coenzyme Q10 10 MG capsule Take 10 mg by mouth daily.   donepezil (ARICEPT) 10 MG tablet TAKE 1 TABLET BY MOUTH EVERYDAY AT BEDTIME   fluticasone (FLONASE) 50 MCG/ACT nasal spray USE 1-2 SQUIRTS EACH NOSTRIL DAILY (Patient not taking: No sig reported)   hydrochlorothiazide (HYDRODIURIL) 25 MG tablet TAKE 1 TABLET BY MOUTH EVERY DAY AS NEEDED   levothyroxine (SYNTHROID) 88 MCG tablet TAKE 1 TABLET BY MOUTH EVERY DAY   losartan (COZAAR) 100 MG tablet TAKE 1 TABLET BY MOUTH EVERY DAY   Magnesium  400 MG CAPS Take 250 mg by mouth daily. (Patient not taking: Reported on 08/11/2020)   metoprolol succinate (TOPROL-XL) 100 MG 24 hr tablet TAKE 1 TABLET BY MOUTH DAILY. TAKE WITH OR IMMEDIATELY FOLLOWING A MEAL.   Milk Thistle-Dand-Fennel-Licor (MILK THISTLE XTRA) CAPS Take by mouth daily.  (Patient not taking: No sig reported)   raloxifene (EVISTA) 60 MG tablet TAKE 1 TABLET BY MOUTH ONCE A DAY   traZODone (DESYREL) 50 MG tablet Take 0.5-1 tablets (25-50 mg total) by mouth at bedtime as needed for sleep. (Patient not taking:  Reported on 08/11/2020)   triamcinolone cream (KENALOG) 0.1 %  (Patient not taking: No sig reported)   vitamin E 400 UNIT capsule Take 400 Units by mouth daily.  (Patient not taking: Reported on 08/11/2020)   No facility-administered medications prior to visit.    Review of Systems  Constitutional:  Negative for activity change and fatigue.  Respiratory:  Negative for cough and shortness of breath.   Cardiovascular:  Negative for chest pain, palpitations and leg swelling.  Musculoskeletal:  Negative for arthralgias.  Neurological:  Negative for dizziness, light-headedness and headaches.  Psychiatric/Behavioral:  Negative for self-injury, sleep disturbance and suicidal ideas. The patient is not nervous/anxious.        Objective    BP (!) 126/42   Pulse (!) 52   Temp 97.7 F (36.5 C)   Resp 16   Wt 117 lb (53.1 kg)   BMI 22.11 kg/m  BP Readings from Last 3 Encounters:  12/29/20 (!) 126/42  08/11/20 (!) 121/59  06/25/20 (!) 114/57   Wt Readings from Last 3 Encounters:  12/29/20 117 lb (53.1 kg)  08/11/20 116 lb 9.6 oz (52.9 kg)  06/25/20 117 lb (53.1 kg)      Physical Exam Vitals reviewed.  Constitutional:      Appearance: She is well-developed.  HENT:     Head: Normocephalic and atraumatic.     Right Ear: External ear normal.     Left Ear: External ear normal.     Nose: Nose normal.  Eyes:     General: No scleral icterus.    Conjunctiva/sclera: Conjunctivae normal.     Pupils: Pupils are equal, round, and reactive to light.  Neck:     Thyroid: No thyromegaly.     Comments: Small stable nodule isthmus of thyroid. Cardiovascular:     Rate and Rhythm: Normal rate and regular rhythm.     Heart sounds: Normal heart sounds.  Pulmonary:     Effort: Pulmonary effort is normal.     Breath sounds: Normal breath sounds.  Abdominal:     General: Bowel sounds are normal.     Palpations: Abdomen is soft.  Musculoskeletal:     Cervical back: Normal range of motion and  neck supple.  Skin:    General: Skin is warm and dry.  Neurological:     General: No focal deficit present.     Mental Status: She is alert and oriented to person, place, and time.     Deep Tendon Reflexes: Reflexes are normal and symmetric.  Psychiatric:        Mood and Affect: Mood normal.        Behavior: Behavior normal.        Thought Content: Thought content normal.        Judgment: Judgment normal.      No results found for any visits on 12/29/20.  Assessment & Plan      1. Essential (  primary) hypertension Well-controlled on amlodipine 5 HCTZ 25 losartan 100 mg and metoprolol 100 mg daily. If anything home blood pressures are occasionally low.  May stop HCTZ in the future. - CBC w/Diff/Platelet - Comprehensive Metabolic Panel (CMET) - TSH  2. Acquired hypothyroidism On 88 mcg of Synthroid - CBC w/Diff/Platelet - Comprehensive Metabolic Panel (CMET) - TSH  3. Fatty infiltration of liver Clinically stable - CBC w/Diff/Platelet - Comprehensive Metabolic Panel (CMET) - TSH  4. Hyperlipidemia, unspecified hyperlipidemia type  - CBC w/Diff/Platelet - Comprehensive Metabolic Panel (CMET) - TSH 5.  MCI On donepezil.  MMSE on next visit.  No follow-ups on file.      I, [name], MD, have reviewed all documentation for this visit. The documentation on 01/01/21 for the exam, diagnosis, procedures, and orders are all accurate and complete.    Keean Wilmeth Wendelyn Breslow, MD  [facility] [phone] (phone) [phone] (fax)  [facility]

## 2020-12-29 ENCOUNTER — Other Ambulatory Visit: Payer: Self-pay

## 2020-12-29 ENCOUNTER — Ambulatory Visit: Payer: Medicare PPO | Admitting: Family Medicine

## 2020-12-29 ENCOUNTER — Encounter: Payer: Self-pay | Admitting: Family Medicine

## 2020-12-29 VITALS — BP 126/42 | HR 52 | Temp 97.7°F | Resp 16 | Wt 117.0 lb

## 2020-12-29 DIAGNOSIS — E785 Hyperlipidemia, unspecified: Secondary | ICD-10-CM

## 2020-12-29 DIAGNOSIS — I10 Essential (primary) hypertension: Secondary | ICD-10-CM

## 2020-12-29 DIAGNOSIS — K760 Fatty (change of) liver, not elsewhere classified: Secondary | ICD-10-CM | POA: Diagnosis not present

## 2020-12-29 DIAGNOSIS — G3184 Mild cognitive impairment, so stated: Secondary | ICD-10-CM

## 2020-12-29 DIAGNOSIS — E039 Hypothyroidism, unspecified: Secondary | ICD-10-CM

## 2020-12-30 LAB — COMPREHENSIVE METABOLIC PANEL
ALT: 28 IU/L (ref 0–32)
AST: 42 IU/L — ABNORMAL HIGH (ref 0–40)
Albumin/Globulin Ratio: 1.7 (ref 1.2–2.2)
Albumin: 3.8 g/dL (ref 3.7–4.7)
Alkaline Phosphatase: 81 IU/L (ref 44–121)
BUN/Creatinine Ratio: 14 (ref 12–28)
BUN: 12 mg/dL (ref 8–27)
Bilirubin Total: 0.8 mg/dL (ref 0.0–1.2)
CO2: 23 mmol/L (ref 20–29)
Calcium: 9.2 mg/dL (ref 8.7–10.3)
Chloride: 104 mmol/L (ref 96–106)
Creatinine, Ser: 0.85 mg/dL (ref 0.57–1.00)
Globulin, Total: 2.2 g/dL (ref 1.5–4.5)
Glucose: 169 mg/dL — ABNORMAL HIGH (ref 65–99)
Potassium: 4.4 mmol/L (ref 3.5–5.2)
Sodium: 142 mmol/L (ref 134–144)
Total Protein: 6 g/dL (ref 6.0–8.5)
eGFR: 69 mL/min/{1.73_m2} (ref >59)

## 2020-12-30 LAB — TSH: TSH: 5.3 u[IU]/mL — ABNORMAL HIGH (ref 0.450–4.500)

## 2020-12-30 LAB — CBC WITH DIFFERENTIAL/PLATELET
Basophils Absolute: 0 10*3/uL (ref 0.0–0.2)
Basos: 1 %
EOS (ABSOLUTE): 0.1 10*3/uL (ref 0.0–0.4)
Eos: 3 %
Hematocrit: 36 % (ref 34.0–46.6)
Hemoglobin: 11.9 g/dL (ref 11.1–15.9)
Immature Grans (Abs): 0 10*3/uL (ref 0.0–0.1)
Immature Granulocytes: 0 %
Lymphocytes Absolute: 0.9 10*3/uL (ref 0.7–3.1)
Lymphs: 25 %
MCH: 30.4 pg (ref 26.6–33.0)
MCHC: 33.1 g/dL (ref 31.5–35.7)
MCV: 92 fL (ref 79–97)
Monocytes Absolute: 0.4 10*3/uL (ref 0.1–0.9)
Monocytes: 10 %
Neutrophils Absolute: 2.3 10*3/uL (ref 1.4–7.0)
Neutrophils: 61 %
Platelets: 104 10*3/uL — ABNORMAL LOW (ref 150–450)
RBC: 3.91 x10E6/uL (ref 3.77–5.28)
RDW: 13.6 % (ref 11.7–15.4)
WBC: 3.7 10*3/uL (ref 3.4–10.8)

## 2021-01-09 ENCOUNTER — Other Ambulatory Visit: Payer: Self-pay | Admitting: Family Medicine

## 2021-01-09 DIAGNOSIS — G3184 Mild cognitive impairment, so stated: Secondary | ICD-10-CM

## 2021-01-29 ENCOUNTER — Other Ambulatory Visit: Payer: Self-pay | Admitting: Family Medicine

## 2021-02-05 ENCOUNTER — Other Ambulatory Visit: Payer: Self-pay | Admitting: Family Medicine

## 2021-02-05 DIAGNOSIS — I10 Essential (primary) hypertension: Secondary | ICD-10-CM

## 2021-02-06 NOTE — Telephone Encounter (Signed)
Requested medications are due for refill today requesting early  Requested medications are on the active medication list yes  Last refill 12/10/20  Last visit 12/29/20  Future visit scheduled 05/04/21  Notes to clinic requesting one month early, please assess.  Requested Prescriptions  Pending Prescriptions Disp Refills   metoprolol succinate (TOPROL-XL) 100 MG 24 hr tablet [Pharmacy Med Name: METOPROLOL SUCC ER 100 MG TAB] 90 tablet 0    Sig: TAKE 1 TABLET BY MOUTH EVERY DAY WITH OR IMMEDIATELY FOLLOWING A MEAL     Cardiovascular:  Beta Blockers Failed - 02/05/2021  1:23 PM      Failed - Last BP in normal range    BP Readings from Last 1 Encounters:  12/29/20 (!) 126/42          Passed - Last Heart Rate in normal range    Pulse Readings from Last 1 Encounters:  12/29/20 (!) 52          Passed - Valid encounter within last 6 months    Recent Outpatient Visits           1 month ago Essential (primary) hypertension   [facility] [name], MD   7 months ago MCI (mild cognitive impairment)   [facility], Leonette Monarch., MD   1 year ago Essential (primary) hypertension   [facility] [name], MD   1 year ago Essential (primary) hypertension   [facility] [name], MD   1 year ago Essential (primary) hypertension   [facility] [name], MD       Future Appointments             In 2 months [name], MD [name]             losartan (COZAAR) 100 MG tablet [Pharmacy Med Name: LOSARTAN POTASSIUM 100 MG TAB] 90 tablet 0    Sig: TAKE 1 TABLET BY MOUTH EVERY DAY     Cardiovascular:  Angiotensin Receptor Blockers Failed - 02/05/2021  1:23 PM      Failed - Last BP in normal range    BP Readings from Last 1 Encounters:  12/29/20 (!) 126/42          Passed - Cr in normal range and within 180 days     Creatinine  Date Value Ref Range Status  07/22/2011 0.61 0.60 - 1.30 mg/dL Final   Creatinine, Ser  Date Value Ref Range Status  12/29/2020 0.85 0.57 - 1.00 mg/dL Final          Passed - K in normal range and within 180 days    Potassium  Date Value Ref Range Status  12/29/2020 4.4 3.5 - 5.2 mmol/L Final  07/22/2011 3.9 3.5 - 5.1 mmol/L Final          Passed - Patient is not pregnant      Passed - Valid encounter within last 6 months    Recent Outpatient Visits           1 month ago Essential (primary) hypertension   [facility] [name], MD   7 months ago MCI (mild cognitive impairment)   [facility] [name], MD   1 year ago Essential (primary) hypertension   [facility] [name], MD   1 year ago Essential (primary) hypertension   [facility] [name], MD   1 year ago Essential (primary) hypertension   [facility] [name], MD       Future Appointments             In 2 months [name], MD [name]

## 2021-03-06 ENCOUNTER — Other Ambulatory Visit: Payer: Self-pay | Admitting: Family Medicine

## 2021-03-06 DIAGNOSIS — I10 Essential (primary) hypertension: Secondary | ICD-10-CM

## 2021-03-06 NOTE — Telephone Encounter (Signed)
Requested medications are due for refill today just filled 02/26/21  Requested medications are on the active medication list yes  Last refill 02/26/21  Notes to clinic just had this med ordered as well as picked up, please assess. Requested Prescriptions  Pending Prescriptions Disp Refills   losartan (COZAAR) 100 MG tablet [Pharmacy Med Name: LOSARTAN POTASSIUM 100 MG TAB] 90 tablet 0    Sig: TAKE 1 TABLET BY MOUTH EVERY DAY     Cardiovascular:  Angiotensin Receptor Blockers Failed - 03/06/2021  2:30 PM      Failed - Last BP in normal range    BP Readings from Last 1 Encounters:  12/29/20 (!) 126/42          Passed - Cr in normal range and within 180 days    Creatinine  Date Value Ref Range Status  07/22/2011 0.61 0.60 - 1.30 mg/dL Final   Creatinine, Ser  Date Value Ref Range Status  12/29/2020 0.85 0.57 - 1.00 mg/dL Final          Passed - K in normal range and within 180 days    Potassium  Date Value Ref Range Status  12/29/2020 4.4 3.5 - 5.2 mmol/L Final  07/22/2011 3.9 3.5 - 5.1 mmol/L Final          Passed - Patient is not pregnant      Passed - Valid encounter within last 6 months    Recent Outpatient Visits           2 months ago Essential (primary) hypertension   [facility] [name], MD   8 months ago MCI (mild cognitive impairment)   [facility] [name], MD   1 year ago Essential (primary) hypertension   [facility] [name], MD   1 year ago Essential (primary) hypertension   [facility] [name], MD   1 year ago Essential (primary) hypertension   [facility] [name], MD       Future Appointments             In 1 month [name], MD [name]

## 2021-05-04 ENCOUNTER — Ambulatory Visit: Payer: Medicare PPO | Admitting: Family Medicine

## 2021-05-11 ENCOUNTER — Encounter: Payer: Self-pay | Admitting: Family Medicine

## 2021-05-11 ENCOUNTER — Other Ambulatory Visit: Payer: Self-pay

## 2021-05-11 ENCOUNTER — Ambulatory Visit: Payer: Medicare PPO | Admitting: Family Medicine

## 2021-05-11 VITALS — BP 145/62 | HR 61 | Temp 98.4°F | Resp 18 | Ht 61.0 in | Wt 115.1 lb

## 2021-05-11 DIAGNOSIS — D649 Anemia, unspecified: Secondary | ICD-10-CM | POA: Diagnosis not present

## 2021-05-11 DIAGNOSIS — I10 Essential (primary) hypertension: Secondary | ICD-10-CM

## 2021-05-11 DIAGNOSIS — E039 Hypothyroidism, unspecified: Secondary | ICD-10-CM | POA: Diagnosis not present

## 2021-05-11 DIAGNOSIS — D696 Thrombocytopenia, unspecified: Secondary | ICD-10-CM

## 2021-05-11 DIAGNOSIS — R413 Other amnesia: Secondary | ICD-10-CM | POA: Diagnosis not present

## 2021-05-11 NOTE — Assessment & Plan Note (Signed)
Recheck labs and adjust as indicated.  

## 2021-05-11 NOTE — Assessment & Plan Note (Signed)
At goal for age, no changes. Obtaining labs.

## 2021-05-11 NOTE — Assessment & Plan Note (Signed)
MMSE stable today, 24/30. Continue aricept. Recommend protective measures, exercise, good nutrition, social interaction. Has good social support and safety. Continue to monitor.

## 2021-05-11 NOTE — Progress Notes (Signed)
° °  SUBJECTIVE:   CHIEF COMPLAINT / HPI:   Hypertension: - Medications: HCTZ, losartan, metoprolol - Compliance: good - Checking BP at home: yes, 120-150 SBP - Denies any SOB, CP, vision changes, LE edema, medication SEs, or symptoms of hypotension  Hypothyroidism - Medications: Synthroid - Current symptoms:  none - Denies diarrhea, heat / cold intolerance, nervousness, and palpitations - Symptoms have been basically asymptomatic  Memory loss - follows with Neuro, Dr. Minette Headland, last visit 07/2020. Primarily short term memory loss. Independent in ADLs. Requires assistance with cooking, managing finances. Drives rarely. Lives with daughter.  OBJECTIVE:   BP (!) 145/62 (BP Location: Left Arm, Patient Position: Sitting, Cuff Size: Small)    Pulse 61    Temp 98.4 F (36.9 C) (Temporal)    Resp 18    Ht 5\' 1"  (1.549 m)    Wt 115 lb 1.6 oz (52.2 kg)    SpO2 97%    BMI 21.75 kg/m   Gen: well appearing, in NAD Card: RRR, systolic murmur Lungs: CTAB Ext: WWP, 1+ pitting edema to shins b/l   ASSESSMENT/PLAN:   Problem List Items Addressed This Visit       Cardiovascular and Mediastinum   Essential (primary) hypertension - Primary    At goal for age, no changes. Obtaining labs.      Relevant Orders   Basic Metabolic Panel (BMET)     Endocrine   Acquired hypothyroidism    Recheck labs and adjust as indicated.      Relevant Orders   TSH     Hematopoietic and Hemostatic   Thrombocytopenia (HCC)   Relevant Orders   CBC     Other   Memory loss    MMSE stable today, 24/30. Continue aricept. Recommend protective measures, exercise, good nutrition, social interaction. Has good social support and safety. Continue to monitor.        F/u in 6 months.  , DO

## 2021-05-12 LAB — CBC
Hematocrit: 32 % — ABNORMAL LOW (ref 34.0–46.6)
Hemoglobin: 10.6 g/dL — ABNORMAL LOW (ref 11.1–15.9)
MCH: 30.4 pg (ref 26.6–33.0)
MCHC: 33.1 g/dL (ref 31.5–35.7)
MCV: 92 fL (ref 79–97)
Platelets: 149 10*3/uL — ABNORMAL LOW (ref 150–450)
RBC: 3.49 x10E6/uL — ABNORMAL LOW (ref 3.77–5.28)
RDW: 13.8 % (ref 11.7–15.4)
WBC: 3.9 10*3/uL (ref 3.4–10.8)

## 2021-05-12 LAB — BASIC METABOLIC PANEL
BUN/Creatinine Ratio: 23 (ref 12–28)
BUN: 19 mg/dL (ref 8–27)
CO2: 23 mmol/L (ref 20–29)
Calcium: 9.2 mg/dL (ref 8.7–10.3)
Chloride: 107 mmol/L — ABNORMAL HIGH (ref 96–106)
Creatinine, Ser: 0.83 mg/dL (ref 0.57–1.00)
Glucose: 94 mg/dL (ref 70–99)
Potassium: 4.6 mmol/L (ref 3.5–5.2)
Sodium: 142 mmol/L (ref 134–144)
eGFR: 71 mL/min/{1.73_m2} (ref >59)

## 2021-05-12 LAB — TSH: TSH: 5.3 u[IU]/mL — ABNORMAL HIGH (ref 0.450–4.500)

## 2021-05-12 MED ORDER — LEVOTHYROXINE SODIUM 100 MCG PO TABS: 100.0000 ug | ORAL_TABLET | Freq: Every day | ORAL | 1 refills | Status: DC

## 2021-05-12 NOTE — Addendum Note (Signed)
Addended by: Caro Laroche on: 05/12/2021 08:55 AM   Modules accepted: Orders

## 2021-05-15 LAB — FERRITIN: Ferritin: 43 ng/mL (ref 15–150)

## 2021-05-15 LAB — IRON AND TIBC
Iron Saturation: 15 % (ref 15–55)
Iron: 53 ug/dL (ref 27–139)
Total Iron Binding Capacity: 352 ug/dL (ref 250–450)
UIBC: 299 ug/dL (ref 118–369)

## 2021-05-15 LAB — VITAMIN B12: Vitamin B-12: 762 pg/mL (ref 232–1245)

## 2021-05-15 LAB — SPECIMEN STATUS REPORT

## 2021-05-15 LAB — FOLATE: Folate: 7.7 ng/mL (ref >3.0)

## 2021-05-27 DIAGNOSIS — D2271 Melanocytic nevi of right lower limb, including hip: Secondary | ICD-10-CM | POA: Diagnosis not present

## 2021-05-27 DIAGNOSIS — L821 Other seborrheic keratosis: Secondary | ICD-10-CM | POA: Diagnosis not present

## 2021-05-27 DIAGNOSIS — D2261 Melanocytic nevi of right upper limb, including shoulder: Secondary | ICD-10-CM | POA: Diagnosis not present

## 2021-05-27 DIAGNOSIS — L900 Lichen sclerosus et atrophicus: Secondary | ICD-10-CM | POA: Diagnosis not present

## 2021-05-27 DIAGNOSIS — R600 Localized edema: Secondary | ICD-10-CM | POA: Diagnosis not present

## 2021-05-27 DIAGNOSIS — D2262 Melanocytic nevi of left upper limb, including shoulder: Secondary | ICD-10-CM | POA: Diagnosis not present

## 2021-05-28 ENCOUNTER — Other Ambulatory Visit: Payer: Self-pay | Admitting: Family Medicine

## 2021-05-28 DIAGNOSIS — I10 Essential (primary) hypertension: Secondary | ICD-10-CM

## 2021-05-28 NOTE — Telephone Encounter (Signed)
Requested Prescriptions  Pending Prescriptions Disp Refills   metoprolol succinate (TOPROL-XL) 100 MG 24 hr tablet [Pharmacy Med Name: METOPROLOL SUCC ER 100 MG TAB] 90 tablet 0    Sig: TAKE 1 TABLET BY MOUTH EVERY DAY WITH OR IMMEDIATELY FOLLOWING A MEAL     Cardiovascular:  Beta Blockers Failed - 05/28/2021 12:31 PM      Failed - Last BP in normal range    BP Readings from Last 1 Encounters:  05/11/21 (!) 145/62         Passed - Last Heart Rate in normal range    Pulse Readings from Last 1 Encounters:  05/11/21 61         Passed - Valid encounter within last 6 months    Recent Outpatient Visits          2 weeks ago Essential (primary) hypertension   [facility] Caro Laroche, DO   5 months ago Essential (primary) hypertension   [facility] [name], MD   11 months ago MCI (mild cognitive impairment)   [facility] [name], MD   1 year ago Essential (primary) hypertension   [facility] [name], MD   1 year ago Essential (primary) hypertension   [facility] [name], MD      Future Appointments            In 5 months [name], MD [name]

## 2021-06-24 ENCOUNTER — Ambulatory Visit: Payer: Self-pay

## 2021-06-24 NOTE — Telephone Encounter (Signed)
?  Chief Complaint: Leg swelling ?Symptoms: Worsening of leg swelling ?Frequency: Pt has had leg swelling for years, but it was recently noted by daughter that swelling has increased. ?Pertinent Negatives: Patient denies SOB, Redness, fever ?Disposition: [] ED /[] Urgent Care (no appt availability in office) / [x] Appointment(In office/virtual)/ []  [place] [facility]/ [] Home Care/ [] Refused Recommended Disposition /[] [place] [facility]/ []  Follow-up with PCP ?Additional Notes:  Daughter purchased compression stockings for pt which seem to be helping. ?S/w daughter. She lives out of town.  This daughter will speak with "in town" daughter and call back to make a sooner appt this week. ? ? ? ? ?Summary: knee discomfort  ?  The patient has a history of swelling in their feet and ankles  ? ?The patient's daughter has called to share that two weeks ago the patient noticed discomfort in their knees as well  ? ?The patient's daughter shares this occurred nearly two weeks ago  ? ?The patient's daughter was not with the patient at the time of call  ? ?The patient's discomfort in their knees has gone away but the patient's daughter would like to discuss further when possible  ? ?Please contact further   ?  ? ? ?Returned pt's call ?Reason for Disposition ? [1] MILD swelling of both ankles (i.e., pedal edema) AND [2] new-onset or worsening ? ?Answer Assessment - Initial Assessment Questions ?1. ONSET: "When did the swelling start?" (e.g., minutes, hours, days) ?    years ?2. LOCATION: "What part of the leg is swollen?"  "Are both legs swollen or just one leg?" ?    both ?3. SEVERITY: "How bad is the swelling?" (e.g., localized; mild, moderate, severe) ? - Localized - small area of swelling localized to one leg ? - MILD pedal edema - swelling limited to foot and ankle, pitting edema < 1/4 inch (6 mm) deep, rest and elevation eliminate most or all swelling ? - MODERATE edema - swelling of lower leg to knee, pitting edema >  1/4 inch (6 mm) deep, rest and elevation only partially reduce swelling ? - SEVERE edema - swelling extends above knee, facial or hand swelling present  ?    Better since purchasing and using  - moderate ?4. REDNESS: "Does the swelling look red or infected?" ?    rash ?5. PAIN: "Is the swelling painful to touch?" If Yes, ask: "How painful is it?"   (Scale 1-10; mild, moderate or severe) ?    no ?6. FEVER: "Do you have a fever?" If Yes, ask: "What is it, how was it measured, and when did it start?"  ?    No  ?7. CAUSE: "What do you think is causing the leg swelling?" ?    [place]  ?8. MEDICAL HISTORY: "Do you have a history of heart failure, kidney disease, liver failure, or cancer?" ?    no ?9. RECURRENT SYMPTOM: "Have you had leg swelling before?" If Yes, ask: "When was the last time?" "What happened that time?" ?    years ?10. OTHER SYMPTOMS: "Do you have any other symptoms?" (e.g., chest pain, difficulty breathing) ?      no ?11. PREGNANCY: "Is there any chance you are pregnant?" "When was your last menstrual period?" ?      na ? ?Protocols used: Leg Swelling and Edema-A-AH ? ?

## 2021-06-30 ENCOUNTER — Ambulatory Visit (INDEPENDENT_AMBULATORY_CARE_PROVIDER_SITE_OTHER): Payer: Medicare PPO

## 2021-06-30 VITALS — Wt 115.0 lb

## 2021-06-30 DIAGNOSIS — Z0000 Encounter for general adult medical examination without abnormal findings: Secondary | ICD-10-CM | POA: Diagnosis not present

## 2021-06-30 NOTE — Patient Instructions (Signed)
Lauren Lawrence , ?Thank you for taking time to come for your Medicare Wellness Visit. I appreciate your ongoing commitment to your health goals. Please review the following plan we discussed and let me know if I can assist you in the future.  ? ?Screening recommendations/referrals: ?Colonoscopy: aged out ?Mammogram: aged out ?Bone Density: 08/13/20 ?Recommended yearly ophthalmology/optometry visit for glaucoma screening and checkup ?Recommended yearly dental visit for hygiene and checkup ? ?Vaccinations: ?Influenza vaccine: 02/04/21 ?Pneumococcal vaccine: 05/21/14 ?Tdap vaccine: 01/03/07, due ?Shingles vaccine: n/d   ?Covid-19:05/11/19, 06/08/19, 04/29/20 ? ?Advanced directives: no ? ?Conditions/risks identified: none ? ?Next appointment: Follow up in one year for your annual wellness visit 07/05/22 @ 9am by phone ? ? ?Preventive Care 46 Years and Older, Female ?Preventive care refers to lifestyle choices and visits with your health care provider that can promote health and wellness. ?What does preventive care include? ?A yearly physical exam. This is also called an annual well check. ?Dental exams once or twice a year. ?Routine eye exams. Ask your health care provider how often you should have your eyes checked. ?Personal lifestyle choices, including: ?Daily care of your teeth and gums. ?Regular physical activity. ?Eating a healthy diet. ?Avoiding tobacco and drug use. ?Limiting alcohol use. ?Practicing safe sex. ?Taking low-dose aspirin every day. ?Taking vitamin and mineral supplements as recommended by your health care provider. ?What happens during an annual well check? ?The services and screenings done by your health care provider during your annual well check will depend on your age, overall health, lifestyle risk factors, and family history of disease. ?Counseling  ?Your health care provider may ask you questions about your: ?Alcohol use. ?Tobacco use. ?Drug use. ?Emotional well-being. ?Home and relationship  well-being. ?Sexual activity. ?Eating habits. ?History of falls. ?Memory and ability to understand (cognition). ?Work and work Astronomer. ?Reproductive health. ?Screening  ?You may have the following tests or measurements: ?Height, weight, and BMI. ?Blood pressure. ?Lipid and cholesterol levels. These may be checked every 5 years, or more frequently if you are over 55 years old. ?Skin check. ?Lung cancer screening. You may have this screening every year starting at age 37 if you have a 30-pack-year history of smoking and currently smoke or have quit within the past 15 years. ?Fecal occult blood test (FOBT) of the stool. You may have this test every year starting at age 60. ?Flexible sigmoidoscopy or colonoscopy. You may have a sigmoidoscopy every 5 years or a colonoscopy every 10 years starting at age 31. ?Hepatitis C blood test. ?Hepatitis B blood test. ?Sexually transmitted disease (STD) testing. ?Diabetes screening. This is done by checking your blood sugar (glucose) after you have not eaten for a while (fasting). You may have this done every 1-3 years. ?Bone density scan. This is done to screen for osteoporosis. You may have this done starting at age 81. ?Mammogram. This may be done every 1-2 years. Talk to your health care provider about how often you should have regular mammograms. ?Talk with your health care provider about your test results, treatment options, and if necessary, the need for more tests. ?Vaccines  ?Your health care provider may recommend certain vaccines, such as: ?Influenza vaccine. This is recommended every year. ?Tetanus, diphtheria, and acellular pertussis (Tdap, Td) vaccine. You may need a Td booster every 10 years. ?Zoster vaccine. You may need this after age 27. ?Pneumococcal 13-valent conjugate (PCV13) vaccine. One dose is recommended after age 41. ?Pneumococcal polysaccharide (PPSV23) vaccine. One dose is recommended after age 52. ?Talk to your  health care provider about which  screenings and vaccines you need and how often you need them. ?This information is not intended to replace advice given to you by your health care provider. Make sure you discuss any questions you have with your health care provider. ?Document Released: 05/02/2015 Document Revised: 12/24/2015 Document Reviewed: 02/04/2015 ?Elsevier Interactive Patient Education ? 2017 [place]. ? ?Fall Prevention in the Home ?Falls can cause injuries. They can happen to people of all ages. There are many things you can do to make your home safe and to help prevent falls. ?What can I do on the outside of my home? ?Regularly fix the edges of walkways and driveways and fix any cracks. ?Remove anything that might make you trip as you walk through a door, such as a raised step or threshold. ?Trim any bushes or trees on the path to your home. ?Use bright outdoor lighting. ?Clear any walking paths of anything that might make someone trip, such as rocks or tools. ?Regularly check to see if handrails are loose or broken. Make sure that both sides of any steps have handrails. ?Any raised decks and porches should have guardrails on the edges. ?Have any leaves, snow, or ice cleared regularly. ?Use sand or salt on walking paths during winter. ?Clean up any spills in your garage right away. This includes oil or grease spills. ?What can I do in the bathroom? ?Use night lights. ?Install grab bars by the toilet and in the tub and shower. Do not use towel bars as grab bars. ?Use non-skid mats or decals in the tub or shower. ?If you need to sit down in the shower, use a plastic, non-slip stool. ?Keep the floor dry. Clean up any water that spills on the floor as soon as it happens. ?Remove soap buildup in the tub or shower regularly. ?Attach bath mats securely with double-sided non-slip rug tape. ?Do not have throw rugs and other things on the floor that can make you trip. ?What can I do in the bedroom? ?Use night lights. ?Make sure that you have a  light by your bed that is easy to reach. ?Do not use any sheets or blankets that are too big for your bed. They should not hang down onto the floor. ?Have a firm chair that has side arms. You can use this for support while you get dressed. ?Do not have throw rugs and other things on the floor that can make you trip. ?What can I do in the kitchen? ?Clean up any spills right away. ?Avoid walking on wet floors. ?Keep items that you use a lot in easy-to-reach places. ?If you need to reach something above you, use a strong step stool that has a grab bar. ?Keep electrical cords out of the way. ?Do not use floor polish or wax that makes floors slippery. If you must use wax, use non-skid floor wax. ?Do not have throw rugs and other things on the floor that can make you trip. ?What can I do with my stairs? ?Do not leave any items on the stairs. ?Make sure that there are handrails on both sides of the stairs and use them. Fix handrails that are broken or loose. Make sure that handrails are as long as the stairways. ?Check any carpeting to make sure that it is firmly attached to the stairs. Fix any carpet that is loose or worn. ?Avoid having throw rugs at the top or bottom of the stairs. If you do have throw rugs, attach them  to the floor with carpet tape. ?Make sure that you have a light switch at the top of the stairs and the bottom of the stairs. If you do not have them, ask someone to add them for you. ?What else can I do to help prevent falls? ?Wear shoes that: ?Do not have high heels. ?Have rubber bottoms. ?Are comfortable and fit you well. ?Are closed at the toe. Do not wear sandals. ?If you use a stepladder: ?Make sure that it is fully opened. Do not climb a closed stepladder. ?Make sure that both sides of the stepladder are locked into place. ?Ask someone to hold it for you, if possible. ?Clearly mark and make sure that you can see: ?Any grab bars or handrails. ?First and last steps. ?Where the edge of each step  is. ?Use tools that help you move around (mobility aids) if they are needed. These include: ?Canes. ?Walkers. ?Scooters. ?Crutches. ?Turn on the lights when you go into a dark area. Replace any light bulbs as soon as they

## 2021-06-30 NOTE — Progress Notes (Signed)
Virtual Visit via Telephone Note  I connected with  Lauren Lawrence on 06/30/21 at  9:00 AM EDT by telephone and verified that I am speaking with the correct person using two identifiers.  Location: Patient: home Provider: [facility] Persons participating in the virtual visit: [place]   I discussed the limitations, risks, security and privacy concerns of performing an evaluation and management service by telephone and the availability of in person appointments. The patient expressed understanding and agreed to proceed.  Interactive audio and video telecommunications were attempted between this nurse and patient, however failed, due to patient having technical difficulties OR patient did not have access to video capability.  We continued and completed visit with audio only.  Some vital signs may be absent or patient reported.   Dionisio David, LPN  Subjective:   Lauren Lawrence is a 81 y.o. female who presents for Medicare Annual (Subsequent) preventive examination.  Review of Systems           Objective:    Today's Vitals   06/30/21 0858  Weight: 115 lb (52.2 kg)   Body mass index is 21.73 kg/m.  Advanced Directives 06/24/2020 06/19/2019 06/14/2018 06/08/2017 02/17/2016 07/14/2015 03/10/2015  Does Patient Have a Medical Advance Directive? Yes Yes Yes No Yes Yes No;Yes  Type of Paramedic of [place];Living will [place];Living will [place];Living will - - - -  Does patient want to make changes to medical advance directive? - - - Yes (MAU/Ambulatory/Procedural Areas - Information given) No - Patient declined - -  Copy of [place] in Chart? No - copy requested No - copy requested No - copy requested - No - copy requested - -    Current Medications (verified) Outpatient Encounter Medications as of 06/30/2021  Medication Sig   Apoaequorin (PREVAGEN EXTRA STRENGTH PO) Take 1 tablet by mouth  daily.   Ascorbic Acid 500 MG CAPS Take by mouth daily.   Cholecalciferol 1000 UNITS TBDP Take 2,000 Units by mouth daily.   clobetasol ointment (TEMOVATE) 0.05 %    Coenzyme Q10 10 MG capsule Take 10 mg by mouth daily.   donepezil (ARICEPT) 10 MG tablet TAKE 1 TABLET BY MOUTH EVERYDAY AT BEDTIME   fluticasone (FLONASE) 50 MCG/ACT nasal spray USE 1-2 SQUIRTS EACH NOSTRIL DAILY   hydrochlorothiazide (HYDRODIURIL) 25 MG tablet TAKE 1 TABLET BY MOUTH EVERY DAY AS NEEDED   levothyroxine (SYNTHROID) 100 MCG tablet Take 1 tablet (100 mcg total) by mouth daily.   losartan (COZAAR) 100 MG tablet TAKE 1 TABLET BY MOUTH EVERY DAY   Magnesium 400 MG CAPS Take 250 mg by mouth daily.   metoprolol succinate (TOPROL-XL) 100 MG 24 hr tablet TAKE 1 TABLET BY MOUTH EVERY DAY WITH OR IMMEDIATELY FOLLOWING A MEAL   Milk Thistle-Dand-Fennel-Licor (MILK THISTLE XTRA) CAPS Take by mouth daily.   raloxifene (EVISTA) 60 MG tablet TAKE 1 TABLET BY MOUTH ONCE A DAY   traZODone (DESYREL) 50 MG tablet Take 0.5-1 tablets (25-50 mg total) by mouth at bedtime as needed for sleep.   triamcinolone cream (KENALOG) 0.1 %    vitamin E 400 UNIT capsule Take 400 Units by mouth daily.   No facility-administered encounter medications on file as of 06/30/2021.    Allergies (verified) Patient has no known allergies.   History: Past Medical History:  Diagnosis Date   Depression    Hyperlipidemia    Hypertension    Memory loss  Past Surgical History:  Procedure Laterality Date   CATARACT EXTRACTION  03/2020   Family History  Problem Relation Age of Onset   Heart disease Mother    Breast cancer Mother 67   Dementia Father    Hypertension Father    Heart disease Father    Gout Father    Heart disease Brother        MI with CABG X3   Breast cancer Maternal Aunt 80   Social History   Socioeconomic History   Marital status: Widowed    Spouse name: Lauren Lawrence   Number of children: 2   Years of education: 16    Highest education level: Bachelor's degree (e.g., BA, AB, BS)  Occupational History   Occupation: retired  Tobacco Use   Smoking status: Never   Smokeless tobacco: Never  Vaping Use   Vaping Use: Never used  Substance and Sexual Activity   Alcohol use: No   Drug use: No   Sexual activity: Never  Other Topics Concern   Not on file  Social History Narrative   08/11/20 Lives at home. One of her daughters, Lauren Lawrence lives with her.    Right handed   Caffeine: 1 cup/day   Social Determinants of Health   Financial Resource Strain: Not on file  Food Insecurity: Not on file  Transportation Needs: Not on file  Physical Activity: Not on file  Stress: Not on file  Social Connections: Not on file    Tobacco Counseling Counseling given: Not Answered   Clinical Intake:  Pre-visit preparation completed: Yes  Pain : No/denies pain     Nutritional Risks: None Diabetes: No  How often do you need to have someone help you when you read instructions, pamphlets, or other written materials from your doctor or pharmacy?: 1 - Never  Diabetic?no  Interpreter Needed?: No  Information entered by :: Kirke Shaggy, LPN   Activities of Daily Living No flowsheet data found.  Patient Care Team: Jerrol Banana., MD as PCP - General (Family Medicine) Lorelee Cover., MD as Consulting Physician (Ophthalmology) Oneta Rack, MD (Dermatology)  Indicate any recent Medical Services you may have received from other than [facility] providers in the past year (date may be approximate).     Assessment:   This is a routine wellness examination for Lauren Lawrence.  Hearing/Vision screen No results found.  Dietary issues and exercise activities discussed:     Goals Addressed   None    Depression Screen PHQ 2/9 Scores 05/11/2021 06/24/2020 01/10/2020 06/19/2019 12/12/2018 12/12/2018 06/14/2018  PHQ - 2 Score 0 0 0 0 0 0 0  PHQ- 9 Score 3 - 1 - 1 - -    Fall Risk Fall Risk  05/11/2021 06/24/2020  01/10/2020 10/12/2019 06/19/2019  Falls in the past year? 1 0 0 0 0  Number falls in past yr: 1 0 0 - 0  Comment 2-4 - - - -  Injury with Fall? 1 0 0 - 0  Risk for fall due to : History of fall(s);Impaired balance/gait;Impaired mobility - - - -  Follow up Falls evaluation completed - Falls evaluation completed - -    FALL RISK PREVENTION PERTAINING TO THE HOME:  Any stairs in or around the home? No  If so, are there any without handrails? No  Home free of loose throw rugs in walkways, pet beds, electrical cords, etc? Yes  Adequate lighting in your home to reduce risk of falls? Yes   ASSISTIVE DEVICES  UTILIZED TO PREVENT FALLS:  Life alert? No  Use of a cane, walker or w/c? No  Grab bars in the bathroom? Yes  Shower chair or bench in shower? No  Elevated toilet seat or a handicapped toilet? No    Cognitive Function: MMSE - Mini Mental State Exam 05/11/2021 08/11/2020 06/25/2020 10/12/2019 08/28/2019  Orientation to time 2 4 3 5 5   Orientation to Place 5 2 5 4 5   Registration 3 3 3 3 2   Attention/ Calculation 5 3 5 5 5   Recall 0 2 0 2 2  Language- name 2 objects 2 2 2 2 2   Language- repeat 1 1 1 1 1   Language- follow 3 step command 3 3 3 3 3   Language- read & follow direction 1 1 1 1 1   Write a sentence 1 1 1 1 1   Copy design 1 1 0 0 1  Total score 24 23 24 27 28      6CIT Screen 06/19/2019 06/08/2017 02/17/2016 01/07/2016  What Year? 0 points 0 points 0 points 0 points  What month? 0 points 0 points 0 points 0 points  What time? 0 points 0 points 0 points 0 points  Count back from 20 0 points 0 points 0 points 0 points  Months in reverse 2 points 0 points 0 points 2 points  Repeat phrase 2 points 0 points 0 points 0 points  Total Score 4 0 0 2    Immunizations Immunization History  Administered Date(s) Administered   Fluad Quad(high Dose 65+) 12/12/2018, 01/10/2020   Hepatitis B, adult 03/04/2011, 04/08/2011   Influenza Split 02/24/2012   Influenza, High Dose Seasonal PF  02/19/2014, 02/04/2015, 01/07/2016, 06/08/2017, 02/23/2018, 02/04/2021   Moderna Sars-Covid-2 Vaccination 05/11/2019, 06/08/2019, 04/29/2020   Pneumococcal Conjugate-13 05/21/2014   Pneumococcal Polysaccharide-23 06/29/2011   Tdap 01/03/2007    TDAP status: Due, Education has been provided regarding the importance of this vaccine. Advised may receive this vaccine at local pharmacy or Health Dept. Aware to provide a copy of the vaccination record if obtained from local pharmacy or Health Dept. Verbalized acceptance and understanding.  Flu Vaccine status: Up to date  Pneumococcal vaccine status: Up to date  Covid-19 vaccine status: Completed vaccines  Qualifies for Shingles Vaccine? Yes   Zostavax completed No   Shingrix Completed?: No.    Education has been provided regarding the importance of this vaccine. Patient has been advised to call insurance company to determine out of pocket expense if they have not yet received this vaccine. Advised may also receive vaccine at local pharmacy or Health Dept. Verbalized acceptance and understanding.  Screening Tests Health Maintenance  Topic Date Due   Zoster Vaccines- Shingrix (1 of 2) Never done   TETANUS/TDAP  01/02/2017   COVID-19 Vaccine (4 - Booster for Moderna series) 06/24/2020   DEXA SCAN  08/14/2022   Pneumonia Vaccine 47+ Years old  Completed   INFLUENZA VACCINE  Completed   HPV VACCINES  Aged Out    Health Maintenance  Health Maintenance Due  Topic Date Due   Zoster Vaccines- Shingrix (1 of 2) Never done   TETANUS/TDAP  01/02/2017   COVID-19 Vaccine (4 - Booster for Moderna series) 06/24/2020    Colorectal cancer screening: No longer required.   Mammogram status: No longer required due to age.  Bone Density status: Completed 08/13/20. Results reflect: Bone density results: OSTEOPOROSIS. Repeat every 2 years.  Lung Cancer Screening: (Low Dose CT Chest recommended if Age 63-80 years, 30 pack-year  currently smoking OR have  quit w/in 15years.) does not qualify.    Additional Screening:  Hepatitis C Screening: does not qualify; Completed no  Vision Screening: Recommended annual ophthalmology exams for early detection of glaucoma and other disorders of the eye. Is the patient up to date with their annual eye exam?  Yes  Who is the provider or what is the name of the office in which the patient attends annual eye exams? Dr.Bell If pt is not established with a provider, would they like to be referred to a provider to establish care? No .   Dental Screening: Recommended annual dental exams for proper oral hygiene  Community Resource Referral / Chronic Care Management: CRR required this visit?  No   CCM required this visit?  No      Plan:     I have personally reviewed and noted the following in the patient's chart:   Medical and social history Use of alcohol, tobacco or illicit drugs  Current medications and supplements including opioid prescriptions.  Functional ability and status Nutritional status Physical activity Advanced directives List of other physicians Hospitalizations, surgeries, and ER visits in previous 12 months Vitals Screenings to include cognitive, depression, and falls Referrals and appointments  In addition, I have reviewed and discussed with patient certain preventive protocols, quality metrics, and best practice recommendations. A written personalized care plan for preventive services as well as general preventive health recommendations were provided to patient.     Dionisio David, LPN   [id number]   Nurse Notes: none

## 2021-07-02 NOTE — Progress Notes (Signed)
? ?I,Lauren Lawrence,acting as a scribe for Lauren KindleElise T Breylen Agyeman, FNP.,have documented all relevant documentation on the behalf of Lauren Kindlelise T Allyiah Gartner, FNP,as directed by  Lauren KindleElise T Lima Chillemi, FNP while in the presence of Lauren KindleElise T Lakota Schweppe, FNP. ?  ? ? ?Established patient visit ? ? ?Patient: Lauren DurhamMitzy C Lawrence   DOB: [birth year]   81 y.o. Female  MRN: [id number] ?Visit Date: 07/03/2021 ? ?Today's healthcare provider: Jacky KindleElise T Geralynn Capri, FNP  ? ?Introduced to Publishing rights managernurse practitioner role and practice setting.  All questions answered.  Discussed provider/patient relationship and expectations. ? ? ?No chief complaint on file. ? ?Subjective  ?   ?HPI  ?Edema   ?Patient has long history of pedal edema that has been controlled.  About 3 weeks ago they noticed the swelling went up to the calf.  She at times has a rash in that area that comes and goes.  During the time of increased swelling she had rash and weeping of the right leg.  She began using compression hose and elevating her feet and the swelling has improved. ? ?Patient also complains of rash on her back that has been present for a few weeks.  She states it is red, raised larger bumps.  She denies pain. ?She has been using cream on it but has not noticed any change. ? ?Medications: ?Outpatient Medications Prior to Visit  ?Medication Sig  ? Apoaequorin (PREVAGEN EXTRA STRENGTH PO) Take 1 tablet by mouth daily.  ? Ascorbic Acid 500 MG CAPS Take by mouth daily.  ? Cholecalciferol 1000 UNITS TBDP Take 2,000 Units by mouth daily.  ? Coenzyme Q10 10 MG capsule Take 10 mg by mouth daily.  ? donepezil (ARICEPT) 10 MG tablet TAKE 1 TABLET BY MOUTH EVERYDAY AT BEDTIME  ? levothyroxine (SYNTHROID) 100 MCG tablet Take 1 tablet (100 mcg total) by mouth daily.  ? losartan (COZAAR) 100 MG tablet TAKE 1 TABLET BY MOUTH EVERY DAY  ? Magnesium 400 MG CAPS Take 250 mg by mouth daily.  ? metoprolol succinate (TOPROL-XL) 100 MG 24 hr tablet TAKE 1 TABLET BY MOUTH EVERY DAY WITH OR IMMEDIATELY FOLLOWING A MEAL  ? Milk  Thistle-Dand-Fennel-Licor (MILK THISTLE XTRA) CAPS Take by mouth daily.  ? triamcinolone cream (KENALOG) 0.1 %   ? fluticasone (FLONASE) 50 MCG/ACT nasal spray USE 1-2 SQUIRTS EACH NOSTRIL DAILY (Patient not taking: Reported on 06/30/2021)  ? hydrochlorothiazide (HYDRODIURIL) 25 MG tablet TAKE 1 TABLET BY MOUTH EVERY DAY AS NEEDED (Patient not taking: Reported on 06/30/2021)  ? raloxifene (EVISTA) 60 MG tablet TAKE 1 TABLET BY MOUTH ONCE A DAY (Patient not taking: Reported on 06/30/2021)  ? traZODone (DESYREL) 50 MG tablet Take 0.5-1 tablets (25-50 mg total) by mouth at bedtime as needed for sleep. (Patient not taking: Reported on 06/30/2021)  ? vitamin E 400 UNIT capsule Take 400 Units by mouth daily. (Patient not taking: Reported on 07/03/2021)  ? [DISCONTINUED] clobetasol ointment (TEMOVATE) 0.05 %  (Patient not taking: Reported on 07/03/2021)  ? ?No facility-administered medications prior to visit.  ? ? ?Review of Systems  ?Respiratory:  Negative for shortness of breath and wheezing.   ?Cardiovascular:  Positive for leg swelling. Negative for chest pain and palpitations.  ? ? ?  Objective  ?  ?BP (!) 125/44 (BP Location: Right Arm, Patient Position: Sitting, Cuff Size: Normal)   Pulse 62   Temp 97.7 ?F (36.5 ?C) (Oral)   Wt 120 lb (54.4 kg)   SpO2 100%   BMI 22.67 kg/m?  ? ? ?  Physical Exam ?Vitals and nursing note reviewed.  ?Constitutional:   ?   General: She is not in acute distress. ?   Appearance: Normal appearance. She is normal weight. She is not ill-appearing, toxic-appearing or diaphoretic.  ?HENT:  ?   Head: Normocephalic and atraumatic.  ?Cardiovascular:  ?   Rate and Rhythm: Normal rate and regular rhythm.  ?   Pulses: Normal pulses.  ?   Heart sounds: Normal heart sounds. No murmur heard. ?  No friction rub. No gallop.  ?Pulmonary:  ?   Effort: Pulmonary effort is normal. No respiratory distress.  ?   Breath sounds: Normal breath sounds. No stridor. No wheezing, rhonchi or rales.  ?Chest:  ?   Chest  wall: No tenderness.  ?Abdominal:  ?   General: Bowel sounds are normal.  ?   Palpations: Abdomen is soft.  ?Musculoskeletal:     ?   General: Swelling present. No tenderness, deformity or signs of injury. Normal range of motion.  ?   Right lower leg: 3+ Pitting Edema present.  ?   Left lower leg: 3+ Pitting Edema present.  ?Skin: ?   General: Skin is warm and dry.  ?   Capillary Refill: Capillary refill takes less than 2 seconds.  ?   Coloration: Skin is not jaundiced or pale.  ?   Findings: Erythema and rash present. No bruising or lesion.  ? ?    ?Neurological:  ?   General: No focal deficit present.  ?   Mental Status: She is alert and oriented to person, place, and time. Mental status is at baseline.  ?   Cranial Nerves: No cranial nerve deficit.  ?   Sensory: No sensory deficit.  ?   Motor: No weakness.  ?   Coordination: Coordination normal.  ?Psychiatric:     ?   Mood and Affect: Mood normal.     ?   Behavior: Behavior normal.     ?   Thought Content: Thought content normal.     ?   Judgment: Judgment normal.  ?  ? ?No results found for any visits on 07/03/21. ? Assessment & Plan  ?  ? ?Problem List Items Addressed This Visit   ? ?  ? Musculoskeletal and Integument  ? Maculopapular rash, localized  ?  Acute, unknown cause ?Will treat with topical steroid BID ?Duration: a few weeks ?Location: upper back, around shirt  ?Itching: yes ?Burning: no ?Redness: yes ?Oozing: no ?Scaling:  ?Blisters: no ?Painful: no ?Fevers: no ?Change in detergents/soaps/personal care products: no ?Recent illness: no ?Recent travel: no ?History of same: no ?Context: no ?Alleviating factors: no ?Treatments attempted:unknown cream, without improvement ?Shortness of breath: no ?Throat/tongue swelling: no ?Myalgias/arthralgias: no ?  ?  ? Relevant Medications  ? clobetasol ointment (TEMOVATE) 0.05 %  ?  ? Other  ? Cellulitis of left lower extremity - Primary  ?  Acute, recurrent ?Longstanding hx of pedal edema, previously managed with  HCTZ, currently not taking ?Associated with edema 2-3+, erythema, blistering, warmth ?Has improved with use of compression stockings, elevation, increase water intake, and ambulation ?Will treat with Abx ?RTC if condition worsens or does not improve ?  ?  ? Relevant Medications  ? cephALEXin (KEFLEX) 500 MG capsule  ? Cellulitis of right lower extremity  ?  Acute, recurrent ?Longstanding hx of pedal edema, previously managed with HCTZ, currently not taking ?Associated with edema 2-3+, erythema, blistering, warmth ?Has improved with use of compression stockings, elevation,  increase water intake, and ambulation ?Will treat with Abx ?RTC if condition worsens or does not improve ?  ?  ? Relevant Medications  ? cephALEXin (KEFLEX) 500 MG capsule  ? ? ? ?Return if symptoms worsen or fail to improve.  ?   ? ?I, Lauren Kindle, FNP, have reviewed all documentation for this visit. The documentation on 07/03/21 for the exam, diagnosis, procedures, and orders are all accurate and complete. ? ? ? ?Lauren Kindle, FNP  ?[facility] ?[phone] (phone) ?[phone] (fax) ? ?[facility] ?

## 2021-07-03 ENCOUNTER — Other Ambulatory Visit: Payer: Self-pay | Admitting: Family Medicine

## 2021-07-03 ENCOUNTER — Ambulatory Visit: Payer: Medicare PPO | Admitting: Family Medicine

## 2021-07-03 ENCOUNTER — Encounter: Payer: Self-pay | Admitting: Family Medicine

## 2021-07-03 ENCOUNTER — Other Ambulatory Visit: Payer: Self-pay

## 2021-07-03 VITALS — BP 125/44 | HR 62 | Temp 97.7°F | Wt 120.0 lb

## 2021-07-03 DIAGNOSIS — L03116 Cellulitis of left lower limb: Secondary | ICD-10-CM | POA: Diagnosis not present

## 2021-07-03 DIAGNOSIS — R21 Rash and other nonspecific skin eruption: Secondary | ICD-10-CM | POA: Diagnosis not present

## 2021-07-03 DIAGNOSIS — L03119 Cellulitis of unspecified part of limb: Secondary | ICD-10-CM | POA: Insufficient documentation

## 2021-07-03 DIAGNOSIS — G3184 Mild cognitive impairment, so stated: Secondary | ICD-10-CM

## 2021-07-03 DIAGNOSIS — L03115 Cellulitis of right lower limb: Secondary | ICD-10-CM

## 2021-07-03 MED ORDER — CLOBETASOL PROPIONATE 0.05 % EX OINT: TOPICAL_OINTMENT | Freq: Two times a day (BID) | CUTANEOUS | 1 refills | Status: DC | PRN

## 2021-07-03 MED ORDER — CEPHALEXIN 500 MG PO CAPS: 500.0000 mg | ORAL_CAPSULE | Freq: Two times a day (BID) | ORAL | 0 refills | Status: DC

## 2021-07-03 NOTE — Telephone Encounter (Signed)
Requested Prescriptions  ?Pending Prescriptions Disp Refills  ?? donepezil (ARICEPT) 10 MG tablet [Pharmacy Med Name: DONEPEZIL HCL 10 MG TABLET] 90 tablet 1  ?  Sig: TAKE 1 TABLET BY MOUTH EVERYDAY AT BEDTIME  ?  ? Neurology:  Alzheimer's Agents Passed - 07/03/2021  9:07 AM  ?  ?  Passed - Valid encounter within last 6 months  ?  Recent Outpatient Visits   ?      ? Today Cellulitis of left lower extremity  ? [facility] [name], FNP  ? 1 month ago Essential (primary) hypertension  ? [facility] [place], [name], DO  ? 6 months ago Essential (primary) hypertension  ? [facility] [name], MD  ? 1 year ago MCI (mild cognitive impairment)  ? [facility] [name], MD  ? 1 year ago Essential (primary) hypertension  ? [facility] [name], MD  ?  ?  ?Future Appointments   ?        ? In 4 months [name], MD [name]  ?  ? ?  ?  ?  ? ?

## 2021-07-03 NOTE — Assessment & Plan Note (Signed)
Acute, unknown cause ?Will treat with topical steroid BID ?Duration: a few weeks ?Location: upper back, around shirt  ?Itching: yes ?Burning: no ?Redness: yes ?Oozing: no ?Scaling:  ?Blisters: no ?Painful: no ?Fevers: no ?Change in detergents/soaps/personal care products: no ?Recent illness: no ?Recent travel: no ?History of same: no ?Context: no ?Alleviating factors: no ?Treatments attempted:unknown cream, without improvement ?Shortness of breath: no ?Throat/tongue swelling: no ?Myalgias/arthralgias: no ?

## 2021-07-03 NOTE — Assessment & Plan Note (Signed)
Acute, recurrent ?Longstanding hx of pedal edema, previously managed with HCTZ, currently not taking ?Associated with edema 2-3+, erythema, blistering, warmth ?Has improved with use of compression stockings, elevation, increase water intake, and ambulation ?Will treat with Abx ?RTC if condition worsens or does not improve ?

## 2021-07-03 NOTE — Patient Instructions (Signed)
?  Recommend mild soap and moisturizing cream 1-2 times daily.   ?  ?Recommend OTC Gold Bond Rapid Relief Anti-Itch cream (pramoxine + menthol), CeraVe Anti-itch cream or lotion (pramoxine), Sarna lotion (Original- menthol + camphor or Sensitive- pramoxine) or Eucerin 12 hour Itch Relief lotion (menthol) up to 3 times per day to areas on body that are itchy. ?  ?Topical steroids (such as triamcinolone, fluocinolone, fluocinonide, mometasone, clobetasol, halobetasol, betamethasone, hydrocortisone) can cause thinning and lightening of the skin if they are used for too long in the same area. Your provider has selected the right strength medicine for your problem and area affected on the body. Please use your medication only as directed by your physician to prevent side effects.  ? ? ?OK for OTC hydrocortisone to LEGS if itching. ?

## 2021-07-03 NOTE — Assessment & Plan Note (Signed)
Acute, recurrent ?Longstanding hx of pedal edema, previously managed with HCTZ, currently not taking ?Associated with edema 2-3+, erythema, blistering, warmth ?Has improved with use of compression stockings, elevation, increase water intake, and ambulation ?Will treat with Abx ?RTC if condition worsens or does not improve ?

## 2021-07-25 ENCOUNTER — Other Ambulatory Visit: Payer: Self-pay | Admitting: Family Medicine

## 2021-07-27 NOTE — Telephone Encounter (Signed)
Dose changed 05/12/21. ?Requested Prescriptions  ?Refused Prescriptions Disp Refills  ?? levothyroxine (SYNTHROID) 88 MCG tablet [Pharmacy Med Name: LEVOTHYROXINE 88 MCG TABLET] 90 tablet 1  ?  Sig: TAKE 1 TABLET BY MOUTH EVERY DAY  ?  ? Endocrinology:  Hypothyroid Agents Failed - 07/25/2021  1:21 AM  ?  ?  Failed - TSH in normal range and within 360 days  ?  TSH  ?Date Value Ref Range Status  ?05/11/2021 5.300 (H) 0.450 - 4.500 uIU/mL Final  ?   ?  ?  Passed - Valid encounter within last 12 months  ?  Recent Outpatient Visits   ?      ? 3 weeks ago Cellulitis of left lower extremity  ? [facility] [name], FNP  ? 2 months ago Essential (primary) hypertension  ? [facility] [place], [name], DO  ? 7 months ago Essential (primary) hypertension  ? [facility] [name], MD  ? 1 year ago MCI (mild cognitive impairment)  ? [facility] [name], MD  ? 1 year ago Essential (primary) hypertension  ? [facility] [name], MD  ?  ?  ?Future Appointments   ?        ? In 3 months [name], MD [name]  ?  ? ?  ?  ?  ? ?

## 2021-08-27 ENCOUNTER — Other Ambulatory Visit: Payer: Self-pay | Admitting: Family Medicine

## 2021-08-27 DIAGNOSIS — I10 Essential (primary) hypertension: Secondary | ICD-10-CM

## 2021-08-27 NOTE — Telephone Encounter (Signed)
Requested Prescriptions  ?Pending Prescriptions Disp Refills  ?? metoprolol succinate (TOPROL-XL) 100 MG 24 hr tablet [Pharmacy Med Name: METOPROLOL SUCC ER 100 MG TAB] 90 tablet 0  ?  Sig: TAKE 1 TABLET BY MOUTH EVERY DAY WITH OR IMMEDIATELY FOLLOWING A MEAL  ?  ? Cardiovascular:  Beta Blockers Failed - 08/27/2021  2:35 AM  ?  ?  Failed - Last BP in normal range  ?  BP Readings from Last 1 Encounters:  ?07/03/21 (!) 125/44  ?   ?  ?  Passed - Last Heart Rate in normal range  ?  Pulse Readings from Last 1 Encounters:  ?07/03/21 62  ?   ?  ?  Passed - Valid encounter within last 6 months  ?  Recent Outpatient Visits   ?      ? 1 month ago Cellulitis of left lower extremity  ? [facility] [name], FNP  ? 3 months ago Essential (primary) hypertension  ? [facility] [place], [name], DO  ? 8 months ago Essential (primary) hypertension  ? [facility] [name], MD  ? 1 year ago MCI (mild cognitive impairment)  ? [name], MD  ? 1 year ago Essential (primary) hypertension  ? [name], MD  ?  ?  ?Future Appointments   ?        ? In 2 months [name], [name]  ?  ? ?  ?  ?  ? ? ?

## 2021-09-20 ENCOUNTER — Other Ambulatory Visit: Payer: Self-pay | Admitting: Family Medicine

## 2021-09-20 DIAGNOSIS — I10 Essential (primary) hypertension: Secondary | ICD-10-CM

## 2021-11-09 ENCOUNTER — Other Ambulatory Visit: Payer: Self-pay | Admitting: Family Medicine

## 2021-11-09 ENCOUNTER — Ambulatory Visit: Payer: Medicare PPO | Admitting: Family Medicine

## 2021-11-09 DIAGNOSIS — E039 Hypothyroidism, unspecified: Secondary | ICD-10-CM

## 2021-11-10 NOTE — Telephone Encounter (Signed)
Requested Prescriptions  Pending Prescriptions Disp Refills  . levothyroxine (SYNTHROID) 100 MCG tablet [Pharmacy Med Name: LEVOTHYROXINE 100 MCG TABLET] 90 tablet 0    Sig: TAKE 1 TABLET BY MOUTH EVERY DAY     Endocrinology:  Hypothyroid Agents Failed - 11/09/2021  9:31 AM      Failed - TSH in normal range and within 360 days    TSH  Date Value Ref Range Status  05/11/2021 5.300 (H) 0.450 - 4.500 uIU/mL Final         Passed - Valid encounter within last 12 months    Recent Outpatient Visits          4 months ago Cellulitis of left lower extremity   [facility] Jacky Kindle, FNP   6 months ago Essential (primary) hypertension   [facility] Caro Laroche, DO   10 months ago Essential (primary) hypertension   [facility] [name], MD   1 year ago MCI (mild cognitive impairment)   [facility] [name], MD   1 year ago Essential (primary) hypertension   [facility] [name], MD      Future Appointments            In 1 week [name], MD [facility], [name]

## 2021-11-18 ENCOUNTER — Ambulatory Visit (INDEPENDENT_AMBULATORY_CARE_PROVIDER_SITE_OTHER): Payer: Medicare PPO | Admitting: Family Medicine

## 2021-11-18 ENCOUNTER — Encounter: Payer: Self-pay | Admitting: Family Medicine

## 2021-11-18 VITALS — BP 175/57 | HR 55 | Resp 16 | Wt 120.0 lb

## 2021-11-18 DIAGNOSIS — M810 Age-related osteoporosis without current pathological fracture: Secondary | ICD-10-CM | POA: Diagnosis not present

## 2021-11-18 DIAGNOSIS — I10 Essential (primary) hypertension: Secondary | ICD-10-CM

## 2021-11-18 DIAGNOSIS — D509 Iron deficiency anemia, unspecified: Secondary | ICD-10-CM | POA: Diagnosis not present

## 2021-11-18 DIAGNOSIS — E559 Vitamin D deficiency, unspecified: Secondary | ICD-10-CM | POA: Diagnosis not present

## 2021-11-18 DIAGNOSIS — R413 Other amnesia: Secondary | ICD-10-CM | POA: Diagnosis not present

## 2021-11-18 DIAGNOSIS — I872 Venous insufficiency (chronic) (peripheral): Secondary | ICD-10-CM

## 2021-11-18 DIAGNOSIS — E039 Hypothyroidism, unspecified: Secondary | ICD-10-CM

## 2021-11-18 DIAGNOSIS — D696 Thrombocytopenia, unspecified: Secondary | ICD-10-CM

## 2021-11-18 DIAGNOSIS — E7800 Pure hypercholesterolemia, unspecified: Secondary | ICD-10-CM

## 2021-11-18 NOTE — Patient Instructions (Signed)
CHECK HOME BLOOD PRESSURES WEEKLY. 

## 2021-11-18 NOTE — Progress Notes (Unsigned)
Established patient visit  I,[name],acting as a scribe for [name], MD.,have documented all relevant documentation on the behalf of [name], MD,as directed by  [name], MD while in the presence of [name], MD.   Patient: Lauren Lawrence   DOB: 10/27/1940   80 y.o. Female  MRN: [id number] Visit Date: 11/18/2021  Today's healthcare provider: [name], MD   Chief Complaint  Patient presents with   Hypertension   Subjective    HPI  Patient is accompanied by her daughter for follow-up.  He is not driving or cooking presently.  Family is looking after her and she lives with her other daughter.  Things are going well.  He is not checking her blood pressure regularly but last year her systolics were in the 686H. Overall she feels well.  She is tolerating her medications.  Her cognitive decline continues but is mild and she has no behavioral issues.  As noted above she continues to live independently with family.  Hypertension, follow-up  BP Readings from Last 3 Encounters:  11/18/21 (!) 175/57  07/03/21 (!) 125/44  05/11/21 (!) 145/62   Wt Readings from Last 3 Encounters:  11/18/21 120 lb (54.4 kg)  07/03/21 120 lb (54.4 kg)  06/30/21 115 lb (52.2 kg)     She was last seen for hypertension 7 months ago.  Management since that visit includes; taking HCTZ, losartan, metoprolol.  Outside blood pressures are not checking.  Pertinent labs Lab Results  Component Value Date   CHOL 149 12/12/2018   HDL 58 12/12/2018   LDLCALC 75 12/12/2018   TRIG 82 12/12/2018   CHOLHDL 2.6 12/12/2018   Lab Results  Component Value Date   NA 142 05/11/2021   K 4.6 05/11/2021   CREATININE 0.83 05/11/2021   EGFR 71 05/11/2021   GLUCOSE 94 05/11/2021   TSH 5.300 (H) 05/11/2021     The ASCVD Risk score (Arnett DK, et al., 2019) failed to calculate for the following reasons:   The 2019 ASCVD risk score is only valid for ages 26 to  72  ---------------------------------------------------------------------------------------------------   Medications: Outpatient Medications Prior to Visit  Medication Sig   Ascorbic Acid 500 MG CAPS Take by mouth daily.   Cholecalciferol 1000 UNITS TBDP Take 2,000 Units by mouth daily.   Coenzyme Q10 10 MG capsule Take 10 mg by mouth daily.   donepezil (ARICEPT) 10 MG tablet TAKE 1 TABLET BY MOUTH EVERYDAY AT BEDTIME   levothyroxine (SYNTHROID) 100 MCG tablet TAKE 1 TABLET BY MOUTH EVERY DAY   losartan (COZAAR) 100 MG tablet TAKE 1 TABLET BY MOUTH EVERY DAY   Magnesium 400 MG CAPS Take 250 mg by mouth daily.   metoprolol succinate (TOPROL-XL) 100 MG 24 hr tablet TAKE 1 TABLET BY MOUTH EVERY DAY WITH OR IMMEDIATELY FOLLOWING A MEAL   Milk Thistle-Dand-Fennel-Licor (MILK THISTLE XTRA) CAPS Take by mouth daily.   Turmeric (QC TUMERIC COMPLEX) 500 MG CAPS Take by mouth.   vitamin E 400 UNIT capsule Take 400 Units by mouth daily.   clobetasol ointment (TEMOVATE) 0.05 % Apply topically 2 (two) times daily as needed. (Patient not taking: Reported on 11/18/2021)   triamcinolone cream (KENALOG) 0.1 %  (Patient not taking: Reported on 11/18/2021)   [DISCONTINUED] Apoaequorin (PREVAGEN EXTRA STRENGTH PO) Take 1 tablet by mouth daily. (Patient not taking: Reported on 11/18/2021)   [DISCONTINUED] cephALEXin (KEFLEX) 500 MG capsule Take 1 capsule (500 mg  total) by mouth 2 (two) times daily. (Patient not taking: Reported on 11/18/2021)   [DISCONTINUED] fluticasone (FLONASE) 50 MCG/ACT nasal spray USE 1-2 SQUIRTS EACH NOSTRIL DAILY (Patient not taking: Reported on 06/30/2021)   [DISCONTINUED] hydrochlorothiazide (HYDRODIURIL) 25 MG tablet TAKE 1 TABLET BY MOUTH EVERY DAY AS NEEDED (Patient not taking: Reported on 06/30/2021)   [DISCONTINUED] raloxifene (EVISTA) 60 MG tablet TAKE 1 TABLET BY MOUTH ONCE A DAY (Patient not taking: Reported on 06/30/2021)   [DISCONTINUED] traZODone (DESYREL) 50 MG tablet Take 0.5-1  tablets (25-50 mg total) by mouth at bedtime as needed for sleep. (Patient not taking: Reported on 06/30/2021)   No facility-administered medications prior to visit.    Review of Systems  Constitutional:  Negative for appetite change, chills, fatigue and fever.  Respiratory:  Negative for chest tightness and shortness of breath.   Cardiovascular:  Negative for chest pain and palpitations.  Gastrointestinal:  Negative for abdominal pain, nausea and vomiting.  Neurological:  Negative for dizziness and weakness.       Objective    BP (!) 175/57 (BP Location: Left Arm, Patient Position: Sitting, Cuff Size: Small)   Pulse (!) 55   Resp 16   Wt 120 lb (54.4 kg)   SpO2 99%   BMI 22.67 kg/m    Physical Exam Vitals reviewed.  Constitutional:      Appearance: She is well-developed.  HENT:     Head: Normocephalic and atraumatic.     Right Ear: External ear normal.     Left Ear: External ear normal.     Nose: Nose normal.  Eyes:     General: No scleral icterus.    Conjunctiva/sclera: Conjunctivae normal.     Pupils: Pupils are equal, round, and reactive to light.  Neck:     Thyroid: No thyromegaly.     Comments: Small stable nodule isthmus of thyroid. Cardiovascular:     Rate and Rhythm: Normal rate and regular rhythm.     Heart sounds: Normal heart sounds.  Pulmonary:     Effort: Pulmonary effort is normal.     Breath sounds: Normal breath sounds.  Abdominal:     General: Bowel sounds are normal.     Palpations: Abdomen is soft.  Musculoskeletal:     Cervical back: Neck supple.     Comments: Trace lower extremity edema with chronic venous stasis changes of the legs which are stable.  No cellulitis present today  Skin:    General: Skin is warm and dry.  Neurological:     General: No focal deficit present.     Mental Status: She is alert and oriented to person, place, and time.     Deep Tendon Reflexes: Reflexes are normal and symmetric.  Psychiatric:        Mood and  Affect: Mood normal.        Behavior: Behavior normal.        Thought Content: Thought content normal.        Judgment: Judgment normal.       No results found for any visits on 11/18/21.  Assessment & Plan     1. Iron deficiency anemia, unspecified iron deficiency anemia type Follow-up CBC.  She has had thrombocytopenia for a couple of decades now. - CBC w/Diff/Platelet - Iron, TIBC and Ferritin Panel  2. Essential (primary) hypertension Check home blood pressure readings and bring those in in about 3 months  3. Venous insufficiency of both lower extremities Wear support hose when possible.  4. Age-related osteoporosis without current pathological fracture   5. Memory loss MMSE on next visit.  6. Avitaminosis D   7.  Hypothyroid    Return in about 3 months (around 02/18/2022).      I, [name], MD, have reviewed all documentation for this visit. The documentation on 11/19/21 for the exam, diagnosis, procedures, and orders are all accurate and complete.    Oneita Allmon Cranford Mon, MD  [facility] [phone] (phone) [phone] (fax)  [place]

## 2021-11-19 LAB — IRON,TIBC AND FERRITIN PANEL
Ferritin: 14 ng/mL — ABNORMAL LOW (ref 15–150)
Iron Saturation: 15 % (ref 15–55)
Iron: 62 ug/dL (ref 27–139)
Total Iron Binding Capacity: 411 ug/dL (ref 250–450)
UIBC: 349 ug/dL (ref 118–369)

## 2021-11-19 LAB — CBC WITH DIFFERENTIAL/PLATELET
Basophils Absolute: 0 10*3/uL (ref 0.0–0.2)
Basos: 1 %
EOS (ABSOLUTE): 0.2 10*3/uL (ref 0.0–0.4)
Eos: 8 %
Hematocrit: 34.6 % (ref 34.0–46.6)
Hemoglobin: 10.7 g/dL — ABNORMAL LOW (ref 11.1–15.9)
Immature Grans (Abs): 0 10*3/uL (ref 0.0–0.1)
Immature Granulocytes: 0 %
Lymphocytes Absolute: 0.7 10*3/uL (ref 0.7–3.1)
Lymphs: 28 %
MCH: 26.9 pg (ref 26.6–33.0)
MCHC: 30.9 g/dL — ABNORMAL LOW (ref 31.5–35.7)
MCV: 87 fL (ref 79–97)
Monocytes Absolute: 0.3 10*3/uL (ref 0.1–0.9)
Monocytes: 11 %
Neutrophils Absolute: 1.4 10*3/uL (ref 1.4–7.0)
Neutrophils: 52 %
Platelets: 91 10*3/uL — CL (ref 150–450)
RBC: 3.98 x10E6/uL (ref 3.77–5.28)
RDW: 15.1 % (ref 11.7–15.4)
WBC: 2.6 10*3/uL — ABNORMAL LOW (ref 3.4–10.8)

## 2021-11-26 ENCOUNTER — Other Ambulatory Visit: Payer: Self-pay | Admitting: Family Medicine

## 2021-11-26 DIAGNOSIS — I10 Essential (primary) hypertension: Secondary | ICD-10-CM

## 2021-12-20 ENCOUNTER — Other Ambulatory Visit: Payer: Self-pay | Admitting: Family Medicine

## 2021-12-20 DIAGNOSIS — I10 Essential (primary) hypertension: Secondary | ICD-10-CM

## 2022-01-05 ENCOUNTER — Other Ambulatory Visit: Payer: Self-pay | Admitting: Family Medicine

## 2022-01-05 DIAGNOSIS — G3184 Mild cognitive impairment, so stated: Secondary | ICD-10-CM

## 2022-02-08 ENCOUNTER — Other Ambulatory Visit: Payer: Self-pay | Admitting: Family Medicine

## 2022-02-08 DIAGNOSIS — E039 Hypothyroidism, unspecified: Secondary | ICD-10-CM

## 2022-03-01 ENCOUNTER — Ambulatory Visit: Payer: Medicare PPO | Admitting: Family Medicine

## 2022-03-25 ENCOUNTER — Ambulatory Visit: Payer: Medicare PPO | Admitting: Family Medicine

## 2022-04-05 ENCOUNTER — Telehealth: Payer: Self-pay | Admitting: Family Medicine

## 2022-04-05 NOTE — Telephone Encounter (Signed)
Will you be taking this patient since she was a Dr. Sullivan Lone patient?    Copied from CRM [id number]. Topic: Appointment Scheduling - Scheduling Inquiry for Clinic >> Mar 31, 2022 12:16 PM Franchot Heidelberg wrote: Reason for CRM: Pt has requested to reschedule with Dr. Sherrie Mustache, prefers female PCP  Please call [phone] if appt in January is approved

## 2022-04-06 ENCOUNTER — Other Ambulatory Visit: Payer: Self-pay | Admitting: Family Medicine

## 2022-04-06 DIAGNOSIS — E039 Hypothyroidism, unspecified: Secondary | ICD-10-CM

## 2022-04-06 NOTE — Telephone Encounter (Signed)
Requested medication (s) are due for refill today: yes  Requested medication (s) are on the active medication list: yes  Last refill:  02/08/22 #60   Future visit scheduled: yes   Notes to clinic:  overdue lab work   Requested Prescriptions  Pending Prescriptions Disp Refills   levothyroxine (SYNTHROID) 100 MCG tablet [Pharmacy Med Name: LEVOTHYROXINE 100 MCG TABLET] 90 tablet 1    Sig: TAKE 1 TABLET BY MOUTH EVERY DAY     Endocrinology:  Hypothyroid Agents Failed - 04/06/2022  8:31 AM      Failed - TSH in normal range and within 360 days    TSH  Date Value Ref Range Status  05/11/2021 5.300 (H) 0.450 - 4.500 uIU/mL Final         Passed - Valid encounter within last 12 months    Recent Outpatient Visits           4 months ago Iron deficiency anemia, unspecified iron deficiency anemia type   [facility] [name], MD   9 months ago Cellulitis of left lower extremity   [facility] Jacky Kindle, FNP   11 months ago Essential (primary) hypertension   [employer], Darl Householder, DO   1 year ago Essential (primary) hypertension   [facility] [name], MD   1 year ago MCI (mild cognitive impairment)   [facility] [name], MD       Future Appointments             In 1 month Fisher, Demetrios Isaacs, MD [name]

## 2022-04-29 ENCOUNTER — Other Ambulatory Visit: Payer: Self-pay | Admitting: Family Medicine

## 2022-04-29 DIAGNOSIS — E039 Hypothyroidism, unspecified: Secondary | ICD-10-CM

## 2022-04-30 ENCOUNTER — Ambulatory Visit: Payer: Medicare PPO | Admitting: Family Medicine

## 2022-05-14 ENCOUNTER — Ambulatory Visit (INDEPENDENT_AMBULATORY_CARE_PROVIDER_SITE_OTHER): Payer: Medicare PPO | Admitting: Family Medicine

## 2022-05-14 ENCOUNTER — Encounter: Payer: Self-pay | Admitting: Family Medicine

## 2022-05-14 VITALS — BP 183/45 | HR 56 | Temp 97.8°F | Wt 126.1 lb

## 2022-05-14 DIAGNOSIS — K760 Fatty (change of) liver, not elsewhere classified: Secondary | ICD-10-CM

## 2022-05-14 DIAGNOSIS — E039 Hypothyroidism, unspecified: Secondary | ICD-10-CM

## 2022-05-14 DIAGNOSIS — Z23 Encounter for immunization: Secondary | ICD-10-CM

## 2022-05-14 DIAGNOSIS — D696 Thrombocytopenia, unspecified: Secondary | ICD-10-CM | POA: Diagnosis not present

## 2022-05-14 DIAGNOSIS — D509 Iron deficiency anemia, unspecified: Secondary | ICD-10-CM

## 2022-05-14 DIAGNOSIS — I10 Essential (primary) hypertension: Secondary | ICD-10-CM | POA: Diagnosis not present

## 2022-05-14 DIAGNOSIS — M810 Age-related osteoporosis without current pathological fracture: Secondary | ICD-10-CM

## 2022-05-14 DIAGNOSIS — E559 Vitamin D deficiency, unspecified: Secondary | ICD-10-CM

## 2022-05-14 NOTE — Progress Notes (Unsigned)
I,Connie R Striblin,acting as a Education administrator for Lelon Huh, MD.,have documented all relevant documentation on the behalf of Lelon Huh, MD,as directed by  Lelon Huh, MD while in the presence of Lelon Huh, MD.   Established patient visit   Patient: Lauren Lawrence   DOB: [birth year]   82 y.o. Female  MRN: [id number] Visit Date: 05/14/2022  Today's healthcare provider: Lelon Huh, MD   No chief complaint on file.  Subjective    HPI  Hypertension, follow-up  BP Readings from Last 3 Encounters:  11/18/21 (!) 175/57  07/03/21 (!) 125/44  05/11/21 (!) 145/62   Wt Readings from Last 3 Encounters:  11/18/21 120 lb (54.4 kg)  07/03/21 120 lb (54.4 kg)  06/30/21 115 lb (52.2 kg)     She was last seen for hypertension 5 months ago by Dr. Rosanna Randy. BP at that visit was 175/57. Management since that visit includes monitoring at home and continuing treatment. She is here with her daughter today who reports home blood pressures are typically in the 120s/70-80s.  Her labs at last visit were notable for borderline low ferritin and low platelet count of 91, and low hemoglobin of 10.7. she states that she had been taking several different OTC vitamins and supplements, but since last visit has   She reports excellent compliance with treatment. She is not having side effects.  Use of agents associated with hypertension: none.    Symptoms: No chest pain No chest pressure  No palpitations No syncope  No dyspnea No orthopnea  No paroxysmal nocturnal dyspnea Yes lower extremity edema   Pertinent labs Lab Results  Component Value Date   CHOL 149 12/12/2018   HDL 58 12/12/2018   LDLCALC 75 12/12/2018   TRIG 82 12/12/2018   CHOLHDL 2.6 12/12/2018   Lab Results  Component Value Date   NA 142 05/11/2021   K 4.6 05/11/2021   CREATININE 0.83 05/11/2021   EGFR 71 05/11/2021   GLUCOSE 94 05/11/2021   TSH 5.300 (H) 05/11/2021     She is also due to follow up on thyroid  medications and vitamin D deficiency.  Last thyroid functions Lab Results  Component Value Date   TSH 5.300 (H) 05/11/2021   Lab Results  Component Value Date   VD25OH 59.1 01/07/2016   She stopped taking vitamin D supplement when she starting taking woments multivitamin.    ---------------------------------------------------------------------------------------------------   Medications: Outpatient Medications Prior to Visit  Medication Sig   Coenzyme Q10 10 MG capsule Take 10 mg by mouth daily.   donepezil (ARICEPT) 10 MG tablet TAKE 1 TABLET BY MOUTH EVERYDAY AT BEDTIME   levothyroxine (SYNTHROID) 100 MCG tablet TAKE 1 TABLET BY MOUTH EVERY DAY   losartan (COZAAR) 100 MG tablet TAKE 1 TABLET BY MOUTH EVERY DAY   metoprolol succinate (TOPROL-XL) 100 MG 24 hr tablet TAKE 1 TABLET BY MOUTH EVERY DAY WITH OR IMMEDIATELY FOLLOWING A MEAL   Multiple Vitamins-Minerals (WOMENS MULTIVITAMIN PO) 1 tablet daily.   No facility-administered medications prior to visit.    Review of Systems  Constitutional:  Negative for appetite change, chills, fatigue and fever.  Respiratory:  Negative for chest tightness and shortness of breath.   Cardiovascular:  Negative for chest pain and palpitations.  Gastrointestinal:  Negative for abdominal pain, nausea and vomiting.  Neurological:  Negative for dizziness and weakness.    {Labs  Heme  Chem  Endocrine  Serology  Results Review (optional):23779}   Objective  BP (!) 183/45 (BP Location: Left Arm, Patient Position: Sitting, Cuff Size: Normal)   Pulse (!) 56   Temp 97.8 F (36.6 C) (Oral)   Wt 126 lb 1.6 oz (57.2 kg)   SpO2 100%   BMI 23.83 kg/m  {Show previous vital signs (optional):23777}  Physical Exam   General: Appearance:    Well developed, well nourished female in no acute distress  Eyes:    PERRL, conjunctiva/corneas clear, EOM's intact       Lungs:     Clear to auscultation bilaterally, respirations unlabored  Heart:     Bradycardic. Normal rhythm. No murmurs, rubs, or gallops.    MS:   All extremities are intact.    Neurologic:   Awake, alert, oriented x 3. No apparent focal neurological defect.         Assessment & Plan     1. Essential (primary) hypertension Office measurements consistently much higher than home measurements. Encouraged to check home blood pressures and a regular basis and let me know if they rise above 140/80. She had been on amlodipine in the past which caused severe leg swelling. She would likely be a good candidate for thiazide diuretic.   2. Acquired hypothyroidism  - T4, free - TSH  3. Fatty infiltration of liver  - Comprehensive metabolic panel  4. Thrombocytopenia (HCC) -CBC  5. Iron deficiency anemia, unspecified iron deficiency anemia type Not taking iron supplement, but is taking women's multivitamin with iron.  - CBC - Iron, TIBC and Ferritin Panel  6. Avitaminosis D No longer on vitamin d supplement, but is taking women's multivitamin with vitamin D and calcium  - VITAMIN D 25 Hydroxy (Vit-D Deficiency, Fractures)  7. Age-related osteoporosis without current pathological fracture Reports she took medication for this many years ago, but does not recall why it was discontinues. She will be due for BMD in April, 2024.    She is to follow up with her dermatology for routine skin exam and dry patchy lesions on upper back.  Flu vaccine given today ***     The entirety of the information documented in the History of Present Illness, Review of Systems and Physical Exam were personally obtained by me. Portions of this information were initially documented by the CMA and reviewed by me for thoroughness and accuracy.     Lelon Huh, MD  [place] [phone] (phone) [phone] (fax)  [place]

## 2022-05-15 LAB — CBC

## 2022-05-16 LAB — CBC
Hematocrit: 32.3 % — ABNORMAL LOW (ref 34.0–46.6)
Hemoglobin: 10.3 g/dL — ABNORMAL LOW (ref 11.1–15.9)
MCH: 28.1 pg (ref 26.6–33.0)
MCHC: 31.9 g/dL (ref 31.5–35.7)
MCV: 88 fL (ref 79–97)
Platelets: 91 10*3/uL — CL (ref 150–450)
RBC: 3.66 x10E6/uL — ABNORMAL LOW (ref 3.77–5.28)
RDW: 14.5 % (ref 11.7–15.4)
WBC: 3.3 10*3/uL — ABNORMAL LOW (ref 3.4–10.8)

## 2022-05-16 LAB — COMPREHENSIVE METABOLIC PANEL
ALT: 26 IU/L (ref 0–32)
AST: 44 IU/L — ABNORMAL HIGH (ref 0–40)
Albumin/Globulin Ratio: 1.5 (ref 1.2–2.2)
Albumin: 3.4 g/dL — ABNORMAL LOW (ref 3.7–4.7)
Alkaline Phosphatase: 89 IU/L (ref 44–121)
BUN/Creatinine Ratio: 18 (ref 12–28)
BUN: 15 mg/dL (ref 8–27)
Bilirubin Total: 0.9 mg/dL (ref 0.0–1.2)
CO2: 25 mmol/L (ref 20–29)
Calcium: 9.3 mg/dL (ref 8.7–10.3)
Chloride: 107 mmol/L — ABNORMAL HIGH (ref 96–106)
Creatinine, Ser: 0.82 mg/dL (ref 0.57–1.00)
Globulin, Total: 2.2 g/dL (ref 1.5–4.5)
Glucose: 106 mg/dL — ABNORMAL HIGH (ref 70–99)
Potassium: 4.7 mmol/L (ref 3.5–5.2)
Sodium: 143 mmol/L (ref 134–144)
Total Protein: 5.6 g/dL — ABNORMAL LOW (ref 6.0–8.5)
eGFR: 72 mL/min/{1.73_m2} (ref >59)

## 2022-05-16 LAB — IRON,TIBC AND FERRITIN PANEL
Ferritin: 14 ng/mL — ABNORMAL LOW (ref 15–150)
Iron Saturation: 12 % — ABNORMAL LOW (ref 15–55)
Iron: 51 ug/dL (ref 27–139)
Total Iron Binding Capacity: 413 ug/dL (ref 250–450)
UIBC: 362 ug/dL (ref 118–369)

## 2022-05-16 LAB — TSH: TSH: 20.3 u[IU]/mL — ABNORMAL HIGH (ref 0.450–4.500)

## 2022-05-16 LAB — T4, FREE: Free T4: 0.69 ng/dL — ABNORMAL LOW (ref 0.82–1.77)

## 2022-05-16 LAB — VITAMIN D 25 HYDROXY (VIT D DEFICIENCY, FRACTURES): Vit D, 25-Hydroxy: 52.9 ng/mL (ref 30.0–100.0)

## 2022-07-05 ENCOUNTER — Ambulatory Visit (INDEPENDENT_AMBULATORY_CARE_PROVIDER_SITE_OTHER): Payer: Medicare PPO

## 2022-07-05 VITALS — Ht 61.0 in | Wt 126.0 lb

## 2022-07-05 DIAGNOSIS — Z0000 Encounter for general adult medical examination without abnormal findings: Secondary | ICD-10-CM | POA: Diagnosis not present

## 2022-07-05 NOTE — Patient Instructions (Signed)
[name] , Thank you for taking time to come for your Medicare Wellness Visit. I appreciate your ongoing commitment to your health goals. Please review the following plan we discussed and let me know if I can assist you in the future.   These are the goals we discussed:  Goals      DIET - EAT MORE FRUITS AND VEGETABLES     DIET - INCREASE WATER INTAKE     Recommend increasing water intake to 6-8 8 oz glasses a day.         This is a list of the screening recommended for you and due dates:  Health Maintenance  Topic Date Due   Zoster (Shingles) Vaccine (1 of 2) Never done   DTaP/Tdap/Td vaccine (2 - Td or Tdap) 01/02/2017   COVID-19 Vaccine (4 - 2023-24 season) 12/18/2021   DEXA scan (bone density measurement)  08/14/2022   Medicare Annual Wellness Visit  07/05/2023   Pneumonia Vaccine  Completed   Flu Shot  Completed   HPV Vaccine  Aged Out    Advanced directives: yes  Conditions/risks identified: none  Next appointment: Follow up in one year for your annual wellness visit 07/06/2023 @8 :15 am telephone   Preventive Care 40 Years and Older, Female Preventive care refers to lifestyle choices and visits with your health care provider that can promote health and wellness. What does preventive care include? A yearly physical exam. This is also called an annual well check. Dental exams once or twice a year. Routine eye exams. Ask your health care provider how often you should have your eyes checked. Personal lifestyle choices, including: Daily care of your teeth and gums. Regular physical activity. Eating a healthy diet. Avoiding tobacco and drug use. Limiting alcohol use. Practicing safe sex. Taking low-dose aspirin every day. Taking vitamin and mineral supplements as recommended by your health care provider. What happens during an annual well check? The services and screenings done by your health care provider during your annual well check will depend on your age, overall  health, lifestyle risk factors, and family history of disease. Counseling  Your health care provider may ask you questions about your: Alcohol use. Tobacco use. Drug use. Emotional well-being. Home and relationship well-being. Sexual activity. Eating habits. History of falls. Memory and ability to understand (cognition). Work and work Statistician. Reproductive health. Screening  You may have the following tests or measurements: Height, weight, and BMI. Blood pressure. Lipid and cholesterol levels. These may be checked every 5 years, or more frequently if you are over 67 years old. Skin check. Lung cancer screening. You may have this screening every year starting at age 23 if you have a 30-pack-year history of smoking and currently smoke or have quit within the past 15 years. Fecal occult blood test (FOBT) of the stool. You may have this test every year starting at age 24. Flexible sigmoidoscopy or colonoscopy. You may have a sigmoidoscopy every 5 years or a colonoscopy every 10 years starting at age 40. Hepatitis C blood test. Hepatitis B blood test. Sexually transmitted disease (STD) testing. Diabetes screening. This is done by checking your blood sugar (glucose) after you have not eaten for a while (fasting). You may have this done every 1-3 years. Bone density scan. This is done to screen for osteoporosis. You may have this done starting at age 38. Mammogram. This may be done every 1-2 years. Talk to your health care provider about how often you should have regular mammograms. Talk  with your health care provider about your test results, treatment options, and if necessary, the need for more tests. Vaccines  Your health care provider may recommend certain vaccines, such as: Influenza vaccine. This is recommended every year. Tetanus, diphtheria, and acellular pertussis (Tdap, Td) vaccine. You may need a Td booster every 10 years. Zoster vaccine. You may need this after age  57. Pneumococcal 13-valent conjugate (PCV13) vaccine. One dose is recommended after age 40. Pneumococcal polysaccharide (PPSV23) vaccine. One dose is recommended after age 70. Talk to your health care provider about which screenings and vaccines you need and how often you need them. This information is not intended to replace advice given to you by your health care provider. Make sure you discuss any questions you have with your health care provider. Document Released: 05/02/2015 Document Revised: 12/24/2015 Document Reviewed: 02/04/2015 Elsevier Interactive Patient Education  2017 [place] Prevention in the Home Falls can cause injuries. They can happen to people of all ages. There are many things you can do to make your home safe and to help prevent falls. What can I do on the outside of my home? Regularly fix the edges of walkways and driveways and fix any cracks. Remove anything that might make you trip as you walk through a door, such as a raised step or threshold. Trim any bushes or trees on the path to your home. Use bright outdoor lighting. Clear any walking paths of anything that might make someone trip, such as rocks or tools. Regularly check to see if handrails are loose or broken. Make sure that both sides of any steps have handrails. Any raised decks and porches should have guardrails on the edges. Have any leaves, snow, or ice cleared regularly. Use sand or salt on walking paths during winter. Clean up any spills in your garage right away. This includes oil or grease spills. What can I do in the bathroom? Use night lights. Install grab bars by the toilet and in the tub and shower. Do not use towel bars as grab bars. Use non-skid mats or decals in the tub or shower. If you need to sit down in the shower, use a plastic, non-slip stool. Keep the floor dry. Clean up any water that spills on the floor as soon as it happens. Remove soap buildup in the tub or shower  regularly. Attach bath mats securely with double-sided non-slip rug tape. Do not have throw rugs and other things on the floor that can make you trip. What can I do in the bedroom? Use night lights. Make sure that you have a light by your bed that is easy to reach. Do not use any sheets or blankets that are too big for your bed. They should not hang down onto the floor. Have a firm chair that has side arms. You can use this for support while you get dressed. Do not have throw rugs and other things on the floor that can make you trip. What can I do in the kitchen? Clean up any spills right away. Avoid walking on wet floors. Keep items that you use a lot in easy-to-reach places. If you need to reach something above you, use a strong step stool that has a grab bar. Keep electrical cords out of the way. Do not use floor polish or wax that makes floors slippery. If you must use wax, use non-skid floor wax. Do not have throw rugs and other things on the floor that can make you  trip. What can I do with my stairs? Do not leave any items on the stairs. Make sure that there are handrails on both sides of the stairs and use them. Fix handrails that are broken or loose. Make sure that handrails are as long as the stairways. Check any carpeting to make sure that it is firmly attached to the stairs. Fix any carpet that is loose or worn. Avoid having throw rugs at the top or bottom of the stairs. If you do have throw rugs, attach them to the floor with carpet tape. Make sure that you have a light switch at the top of the stairs and the bottom of the stairs. If you do not have them, ask someone to add them for you. What else can I do to help prevent falls? Wear shoes that: Do not have high heels. Have rubber bottoms. Are comfortable and fit you well. Are closed at the toe. Do not wear sandals. If you use a stepladder: Make sure that it is fully opened. Do not climb a closed stepladder. Make sure that  both sides of the stepladder are locked into place. Ask someone to hold it for you, if possible. Clearly mark and make sure that you can see: Any grab bars or handrails. First and last steps. Where the edge of each step is. Use tools that help you move around (mobility aids) if they are needed. These include: Canes. Walkers. Scooters. Crutches. Turn on the lights when you go into a dark area. Replace any light bulbs as soon as they burn out. Set up your furniture so you have a clear path. Avoid moving your furniture around. If any of your floors are uneven, fix them. If there are any pets around you, be aware of where they are. Review your medicines with your doctor. Some medicines can make you feel dizzy. This can increase your chance of falling. Ask your doctor what other things that you can do to help prevent falls. This information is not intended to replace advice given to you by your health care provider. Make sure you discuss any questions you have with your health care provider. Document Released: 01/30/2009 Document Revised: 09/11/2015 Document Reviewed: 05/10/2014 Elsevier Interactive Patient Education  2017 [employer].

## 2022-07-05 NOTE — Progress Notes (Signed)
I connected with  Lauren Lawrence on 07/05/22 by a audio enabled telemedicine application and verified that I am speaking with the correct person using two identifiers.  Patient Location: Home  Provider Location: Office/Clinic  I discussed the limitations of evaluation and management by telemedicine. The patient expressed understanding and agreed to proceed.  Subjective:   Lauren Lawrence is a 82 y.o. female who presents for Medicare Annual (Subsequent) preventive examination.  Review of Systems         Objective:    Today's Vitals   07/05/22 0845  Weight: 126 lb (57.2 kg)  Height: 5\' 1"  (1.549 m)   Body mass index is 23.81 kg/m.     07/05/2022    8:56 AM 06/30/2021    9:09 AM 06/24/2020    9:07 AM 06/19/2019    9:42 AM 06/14/2018   11:30 AM 06/08/2017   10:34 AM 02/17/2016    8:59 AM  Advanced Directives  Does Patient Have a Medical Advance Directive? Yes No Yes Yes Yes No Yes  Type of Scientist, physiological of [place];Living will [place];Living will [place];Living will    Does patient want to make changes to medical advance directive?      Yes (MAU/Ambulatory/Procedural Areas - Information given) No - Patient declined  Copy of [place] in Chart?   No - copy requested No - copy requested No - copy requested  No - copy requested  Would patient like information on creating a medical advance directive?  No - Patient declined         Current Medications (verified) Outpatient Encounter Medications as of 07/05/2022  Medication Sig   donepezil (ARICEPT) 10 MG tablet TAKE 1 TABLET BY MOUTH EVERYDAY AT BEDTIME   levothyroxine (SYNTHROID) 100 MCG tablet TAKE 1 TABLET BY MOUTH EVERY DAY   metoprolol succinate (TOPROL-XL) 100 MG 24 hr tablet TAKE 1 TABLET BY MOUTH EVERY DAY WITH OR IMMEDIATELY FOLLOWING A MEAL   Coenzyme Q10 10 MG capsule Take 10 mg by mouth daily. (Patient not taking: Reported on 07/05/2022)    losartan (COZAAR) 100 MG tablet TAKE 1 TABLET BY MOUTH EVERY DAY (Patient not taking: Reported on 07/05/2022)   Multiple Vitamins-Minerals (WOMENS MULTIVITAMIN PO) 1 tablet daily. (Patient not taking: Reported on 07/05/2022)   No facility-administered encounter medications on file as of 07/05/2022.    Allergies (verified) Patient has no known allergies.   History: Past Medical History:  Diagnosis Date   Depression    Hyperlipidemia    Hypertension    Memory loss    Past Surgical History:  Procedure Laterality Date   CATARACT EXTRACTION  03/2020   Family History  Problem Relation Age of Onset   Heart disease Mother    Breast cancer Mother 26   Dementia Father    Hypertension Father    Heart disease Father    Gout Father    Heart disease Brother        MI with CABG X3   Breast cancer Maternal Aunt 80   Social History   Socioeconomic History   Marital status: Widowed    Spouse name: Laveda Abbe   Number of children: 2   Years of education: 16   Highest education level: Bachelor's degree (e.g., BA, AB, BS)  Occupational History   Occupation: retired  Tobacco Use   Smoking status: Never   Smokeless tobacco: Never  Vaping Use   Vaping Use: Never used  Substance and Sexual Activity   Alcohol use: No   Drug use: No   Sexual activity: Never  Other Topics Concern   Not on file  Social History Narrative   08/11/20 Lives at home. One of her daughters, Helene Kelp lives with her.    Right handed   Caffeine: 1 cup/day   Social Determinants of Health   Financial Resource Strain: Low Risk  (07/05/2022)   Overall Financial Resource Strain (CARDIA)    Difficulty of Paying Living Expenses: Not hard at all  Food Insecurity: No Food Insecurity (07/05/2022)   Hunger Vital Sign    Worried About Running Out of Food in the Last Year: Never true    Ran Out of Food in the Last Year: Never true  Transportation Needs: No Transportation Needs (07/05/2022)   PRAPARE - Radiographer, therapeutic (Medical): No    Lack of Transportation (Non-Medical): No  Physical Activity: Insufficiently Active (07/05/2022)   Exercise Vital Sign    Days of Exercise per Week: 2 days    Minutes of Exercise per Session: 30 min  Stress: No Stress Concern Present (07/05/2022)   [place]    Feeling of Stress : Not at all  Social Connections: Unknown (07/05/2022)   Social Connection and Isolation Panel [NHANES]    Frequency of Communication with Friends and Family: More than three times a week    Frequency of Social Gatherings with Friends and Family: Three times a week    Attends Religious Services: More than 4 times per year    Active Member of Clubs or Organizations: Not on file    Attends Archivist Meetings: Not on file    Marital Status: Not on file    Tobacco Counseling Counseling given: Not Answered   Clinical Intake:  Pre-visit preparation completed: Yes  Pain : No/denies pain     BMI - recorded: 23.81 Nutritional Risks: None Diabetes: No     Diabetic?no  Interpreter Needed?: No  Information entered by :: B.Mays Paino,LPN   Activities of Daily Living    07/05/2022    8:56 AM 05/14/2022   10:45 AM  In your present state of health, do you have any difficulty performing the following activities:  Hearing? 0 0  Vision? 0 0  Difficulty concentrating or making decisions? 1 0  Walking or climbing stairs? 0 0  Dressing or bathing? 0 0  Doing errands, shopping? 0 0  Preparing Food and eating ? N   Using the Toilet? N   In the past six months, have you accidently leaked urine? N   Do you have problems with loss of bowel control? N   Managing your Medications? Y   Comment daughter helps   Managing your Finances? N   Housekeeping or managing your Housekeeping? Y     Patient Care Team: Birdie Sons, MD as PCP - General (Family Medicine) Lorelee Cover., MD as Consulting Physician  (Ophthalmology) Oneta Rack, MD (Dermatology)  Indicate any recent Medical Services you may have received from other than [facility] providers in the past year (date may be approximate).     Assessment:   This is a routine wellness examination for Lauren Lawrence.  Hearing/Vision screen Hearing Screening - Comments:: Adequate hearing Vision Screening - Comments:: Adequate vision w/glasses esp. Reading [facility]  Dietary issues and exercise activities discussed:     Goals Addressed   None    Depression Screen  07/05/2022    8:53 AM 05/14/2022   10:45 AM 06/30/2021    9:07 AM 05/11/2021   10:09 AM 06/24/2020    9:05 AM 01/10/2020    9:07 AM 06/19/2019    9:40 AM  PHQ 2/9 Scores  PHQ - 2 Score 1 1 0 0 0 0 0  PHQ- 9 Score 2 2 1 3  1      Fall Risk    07/05/2022    9:11 AM 05/14/2022   10:45 AM 06/30/2021    9:10 AM 05/11/2021   10:07 AM 06/24/2020    9:07 AM  Fall Risk   Falls in the past year? 0 1 0 1 0  Number falls in past yr: 0 0 0 1 0  Comment    2-4   Injury with Fall? 0 0 0 1 0  Risk for fall due to :   No Fall Risks History of fall(s);Impaired balance/gait;Impaired mobility   Follow up Falls prevention discussed;Education provided  Falls evaluation completed Falls evaluation completed     FALL RISK PREVENTION PERTAINING TO THE HOME:  Any stairs in or around the home? Yes  If so, are there any without handrails? Yes  Home free of loose throw rugs in walkways, pet beds, electrical cords, etc? Yes  Adequate lighting in your home to reduce risk of falls? Yes   ASSISTIVE DEVICES UTILIZED TO PREVENT FALLS:  Life alert? No  Use of a cane, walker or w/c? No  Grab bars in the bathroom? Yes  Shower chair or bench in shower? No  Elevated toilet seat or a handicapped toilet? No    Cognitive Function:    05/11/2021   10:23 AM 08/11/2020    1:43 PM 06/25/2020   10:57 AM 10/12/2019    8:54 AM 08/28/2019    8:28 AM  MMSE - Mini Mental State Exam  Orientation to time 2 4 3 5 5    Orientation to Place 5 2 5 4 5   Registration 3 3 3 3 2   Attention/ Calculation 5 3 5 5 5   Recall 0 2 0 2 2  Language- name 2 objects 2 2 2 2 2   Language- repeat 1 1 1 1 1   Language- follow 3 step command 3 3 3 3 3   Language- read & follow direction 1 1 1 1 1   Write a sentence 1 1 1 1 1   Copy design 1 1 0 0 1  Total score 24 23 24 27 28         07/05/2022    8:57 AM 06/19/2019    9:46 AM 06/08/2017   10:40 AM 02/17/2016    9:05 AM 01/07/2016   10:33 AM  6CIT Screen  What Year? 4 points 0 points 0 points 0 points 0 points  What month? 3 points 0 points 0 points 0 points 0 points  What time? 0 points 0 points 0 points 0 points 0 points  Count back from 20 4 points 0 points 0 points 0 points 0 points  Months in reverse 4 points 2 points 0 points 0 points 2 points  Repeat phrase  2 points 0 points 0 points 0 points  Total Score  4 points 0 points 0 points 2 points    Immunizations Immunization History  Administered Date(s) Administered   Fluad Quad(high Dose 65+) 12/12/2018, 01/10/2020, 05/14/2022   Hepatitis B, ADULT 03/04/2011, 04/08/2011   Influenza Split 02/24/2012   Influenza, High Dose Seasonal PF 02/19/2014, 02/04/2015, 01/07/2016, 06/08/2017, 02/23/2018,  02/04/2021   Moderna Sars-Covid-2 Vaccination 05/11/2019, 06/08/2019, 04/29/2020   Pneumococcal Conjugate-13 05/21/2014   Pneumococcal Polysaccharide-23 06/29/2011   Tdap 01/03/2007    TDAP status: Up to date  Flu Vaccine status: Up to date  Pneumococcal vaccine status: Up to date  Covid-19 vaccine status: Completed vaccines  Qualifies for Shingles Vaccine? Yes   Zostavax completed No   Shingrix Completed?: No.    Education has been provided regarding the importance of this vaccine. Patient has been advised to call insurance company to determine out of pocket expense if they have not yet received this vaccine. Advised may also receive vaccine at local pharmacy or Health Dept. Verbalized acceptance and  understanding.  Screening Tests Health Maintenance  Topic Date Due   Zoster Vaccines- Shingrix (1 of 2) Never done   DTaP/Tdap/Td (2 - Td or Tdap) 01/02/2017   COVID-19 Vaccine (4 - 2023-24 season) 12/18/2021   DEXA SCAN  08/14/2022   Medicare Annual Wellness (AWV)  07/05/2023   Pneumonia Vaccine 54+ Years old  Completed   INFLUENZA VACCINE  Completed   HPV VACCINES  Aged Out    Health Maintenance  Health Maintenance Due  Topic Date Due   Zoster Vaccines- Shingrix (1 of 2) Never done   DTaP/Tdap/Td (2 - Td or Tdap) 01/02/2017   COVID-19 Vaccine (4 - 2023-24 season) 12/18/2021    Colorectal cancer screening: No longer required.   Mammogram status: No longer required due to age.  Bone Density status: Completed yes. Results reflect: Bone density results: OSTEOPOROSIS. Repeat every 3 years.  Lung Cancer Screening: (Low Dose CT Chest recommended if Age 70-80 years, 30 pack-year currently smoking OR have quit w/in 15years.) does not qualify.   Lung Cancer Screening Referral: no  Additional Screening:  Hepatitis C Screening: does not qualify; Completed no  Vision Screening: Recommended annual ophthalmology exams for early detection of glaucoma and other disorders of the eye. Is the patient up to date with their annual eye exam?  Yes  Who is the provider or what is the name of the office in which the patient attends annual eye exams? [place] If pt is not established with a provider, would they like to be referred to a provider to establish care? No .   Dental Screening: Recommended annual dental exams for proper oral hygiene  Community Resource Referral / Chronic Care Management: CRR required this visit?  No   CCM required this visit?  No      Plan:     I have personally reviewed and noted the following in the patient's chart:   Medical and social history Use of alcohol, tobacco or illicit drugs  Current medications and supplements including opioid  prescriptions. Patient is not currently taking opioid prescriptions. Functional ability and status Nutritional status Physical activity Advanced directives List of other physicians Hospitalizations, surgeries, and ER visits in previous 12 months Vitals Screenings to include cognitive, depression, and falls Referrals and appointments  In addition, I have reviewed and discussed with patient certain preventive protocols, quality metrics, and best practice recommendations. A written personalized care plan for preventive services as well as general preventive health recommendations were provided to patient.     Roger Shelter, LPN   [id number]   Nurse Notes: pt pleasant on the phone, has memory and recall issues to questions, asking her daughter in the room with her. Pt voices no concerns or questions at this time. Pt reminded of appt next month in April (had to say 3  times at different junctions in conversation to pt).

## 2022-08-16 ENCOUNTER — Encounter: Payer: Self-pay | Admitting: Family Medicine

## 2022-08-16 ENCOUNTER — Ambulatory Visit: Payer: Medicare PPO | Admitting: Family Medicine

## 2022-08-16 VITALS — BP 187/49 | HR 66 | Wt 129.0 lb

## 2022-08-16 DIAGNOSIS — I10 Essential (primary) hypertension: Secondary | ICD-10-CM

## 2022-08-16 DIAGNOSIS — R413 Other amnesia: Secondary | ICD-10-CM

## 2022-08-16 DIAGNOSIS — E039 Hypothyroidism, unspecified: Secondary | ICD-10-CM | POA: Diagnosis not present

## 2022-08-16 DIAGNOSIS — R5382 Chronic fatigue, unspecified: Secondary | ICD-10-CM | POA: Diagnosis not present

## 2022-08-16 DIAGNOSIS — D508 Other iron deficiency anemias: Secondary | ICD-10-CM | POA: Diagnosis not present

## 2022-08-16 DIAGNOSIS — D696 Thrombocytopenia, unspecified: Secondary | ICD-10-CM

## 2022-08-16 NOTE — Progress Notes (Signed)
Established patient visit   Patient: Lauren Lawrence   DOB: [birth year]   82 y.o. Female  MRN: [id number] Visit Date: 08/16/2022  Today's healthcare provider: Mila Merry, MD   Chief Complaint  Patient presents with   Follow-up    3 months f/u   Subjective    HPI Here today to follow up hypertension, hyPOthyroid and iron deficient anemia last seen 3 months ago . BP Readings from Last 5 Encounters:  08/16/22 (!) 187/49  05/14/22 (!) 183/45  11/18/21 (!) 175/57  07/03/21 (!) 125/44  05/11/21 (!) 145/62   At that time she had reports much lower home blood pressures and advised to keep a recording. She is here by herself today and did not bring home BP readings, and is poor historian due to poor memory.   Lab Results  Component Value Date   TSH 20.300 (H) 05/14/2022   She was advised to increase levothyroxine after last visit, but her daughter reported that she had not been taking medication consistently. Patient today states that she has been out of thyroid medication for a few weeks, but dispense history shows it was just dispensed on 08-01-2022.   Lab Results  Component Value Date   IRON 51 05/14/2022   TIBC 413 05/14/2022   FERRITIN 14 (L) 05/14/2022   Lab Results  Component Value Date   WBC 3.3 (L) 05/14/2022   HGB 10.3 (L) 05/14/2022   HCT 32.3 (L) 05/14/2022   MCV 88 05/14/2022   PLT 91 (LL) 05/14/2022   She advised to start OTC iron supplement after labs done in January. Patient today states she is not taking an iron supplement, but she is not a reliable historian.   Medications: Outpatient Medications Prior to Visit  Medication Sig   Coenzyme Q10 10 MG capsule Take 10 mg by mouth daily.   donepezil (ARICEPT) 10 MG tablet TAKE 1 TABLET BY MOUTH EVERYDAY AT BEDTIME   levothyroxine (SYNTHROID) 100 MCG tablet TAKE 1 TABLET BY MOUTH EVERY DAY   losartan (COZAAR) 100 MG tablet TAKE 1 TABLET BY MOUTH EVERY DAY   metoprolol succinate (TOPROL-XL) 100 MG 24 hr  tablet TAKE 1 TABLET BY MOUTH EVERY DAY WITH OR IMMEDIATELY FOLLOWING A MEAL   Multiple Vitamins-Minerals (WOMENS MULTIVITAMIN PO) 1 tablet daily.   No facility-administered medications prior to visit.    Review of Systems  Constitutional:  Negative for appetite change, chills, fatigue and fever.  Respiratory:  Negative for chest tightness and shortness of breath.   Cardiovascular:  Negative for chest pain and palpitations.  Gastrointestinal:  Negative for abdominal pain, nausea and vomiting.  Neurological:  Negative for dizziness and weakness.       Objective    BP (!) 187/49 (BP Location: Left Arm, Patient Position: Sitting, Cuff Size: Normal)   Pulse 66   Wt 129 lb (58.5 kg)   SpO2 99%   BMI 24.37 kg/m    Physical Exam    General: Appearance:    Well developed, well nourished female in no acute distress  Eyes:    PERRL, conjunctiva/corneas clear, EOM's intact       Lungs:     Clear to auscultation bilaterally, respirations unlabored  Heart:    Normal heart rate. Normal rhythm. No murmurs, rubs, or gallops.    MS:   All extremities are intact.    Neurologic:   Awake, alert, oriented x 2. No apparent focal neurological defect.  Assessment & Plan     1. Essential (primary) hypertension Uncontrolled systolic BP, but diastolic consistently low. Consider reducing metroprolol and adding diuretic after reviewing labs.   - Comprehensive metabolic panel  2. Other iron deficiency anemia Advised in January to start iron supplement, but patient states she is not taking it, although she is not a reliable historian due to poor memory.  - Iron, TIBC and Ferritin Panel  3. Acquired hypothyroidism Pt states she has been out of medication, but dispense hx shows 90 day supply dispensed on 08-01-2022  - T4, free - TSH  4. Thrombocytopenia (HCC)  - CBC  5. Chronic fatigue   6. Poor memory          Mila Merry, MD  [facility] [phone] (phone) [phone] (fax)  [facility]

## 2022-08-16 NOTE — Patient Instructions (Signed)
Please review the attached list of medications and notify my office if there are any errors.   We will need to adjust your blood pressure medications after reviewing the results of your labs

## 2022-08-17 LAB — TSH: TSH: 74.1 u[IU]/mL — ABNORMAL HIGH (ref 0.450–4.500)

## 2022-08-17 LAB — CBC
Hematocrit: 31.3 % — ABNORMAL LOW (ref 34.0–46.6)
Hemoglobin: 10.1 g/dL — ABNORMAL LOW (ref 11.1–15.9)
MCH: 26.4 pg — ABNORMAL LOW (ref 26.6–33.0)
MCHC: 32.3 g/dL (ref 31.5–35.7)
MCV: 82 fL (ref 79–97)
Platelets: 103 10*3/uL — ABNORMAL LOW (ref 150–450)
RBC: 3.82 x10E6/uL (ref 3.77–5.28)
RDW: 14.9 % (ref 11.7–15.4)
WBC: 2.6 10*3/uL — ABNORMAL LOW (ref 3.4–10.8)

## 2022-08-17 LAB — COMPREHENSIVE METABOLIC PANEL
ALT: 32 IU/L (ref 0–32)
AST: 47 IU/L — ABNORMAL HIGH (ref 0–40)
Albumin/Globulin Ratio: 1.4 (ref 1.2–2.2)
Albumin: 3.6 g/dL — ABNORMAL LOW (ref 3.7–4.7)
Alkaline Phosphatase: 79 IU/L (ref 44–121)
BUN/Creatinine Ratio: 20 (ref 12–28)
BUN: 17 mg/dL (ref 8–27)
Bilirubin Total: 0.9 mg/dL (ref 0.0–1.2)
CO2: 23 mmol/L (ref 20–29)
Calcium: 9.2 mg/dL (ref 8.7–10.3)
Chloride: 106 mmol/L (ref 96–106)
Creatinine, Ser: 0.85 mg/dL (ref 0.57–1.00)
Globulin, Total: 2.6 g/dL (ref 1.5–4.5)
Glucose: 103 mg/dL — ABNORMAL HIGH (ref 70–99)
Potassium: 4.1 mmol/L (ref 3.5–5.2)
Sodium: 142 mmol/L (ref 134–144)
Total Protein: 6.2 g/dL (ref 6.0–8.5)
eGFR: 69 mL/min/{1.73_m2} (ref >59)

## 2022-08-17 LAB — IRON,TIBC AND FERRITIN PANEL
Ferritin: 11 ng/mL — ABNORMAL LOW (ref 15–150)
Iron Saturation: 9 % — CL (ref 15–55)
Iron: 39 ug/dL (ref 27–139)
Total Iron Binding Capacity: 455 ug/dL — ABNORMAL HIGH (ref 250–450)
UIBC: 416 ug/dL — ABNORMAL HIGH (ref 118–369)

## 2022-08-17 LAB — T4, FREE: Free T4: 0.44 ng/dL — ABNORMAL LOW (ref 0.82–1.77)

## 2022-08-26 ENCOUNTER — Other Ambulatory Visit: Payer: Self-pay

## 2022-08-26 DIAGNOSIS — E039 Hypothyroidism, unspecified: Secondary | ICD-10-CM

## 2022-08-26 MED ORDER — LEVOTHYROXINE SODIUM 112 MCG PO TABS
112.0000 ug | ORAL_TABLET | Freq: Every day | ORAL | 3 refills | Status: DC
Start: 2022-08-26 — End: 2022-09-02

## 2022-09-02 MED ORDER — LEVOTHYROXINE SODIUM 112 MCG PO TABS
112.0000 ug | ORAL_TABLET | Freq: Every day | ORAL | 3 refills | Status: DC
Start: 2022-09-02 — End: 2022-10-19

## 2022-09-02 NOTE — Addendum Note (Signed)
Addended by: Mila Merry E on: 09/02/2022 11:04 AM   Modules accepted: Orders

## 2022-09-06 ENCOUNTER — Ambulatory Visit: Payer: Self-pay

## 2022-09-06 DIAGNOSIS — I10 Essential (primary) hypertension: Secondary | ICD-10-CM

## 2022-09-06 NOTE — Telephone Encounter (Signed)
Chief Complaint: Was losartan stopped Disposition: [] ED /[] Urgent Care (no appt availability in office) / [] Appointment(In office/virtual)/ []  [place] [facility]/ [] Home Care/ [] Refused Recommended Disposition /[] [place] [facility]/ [x]  Follow-up with PCP Additional Notes: Per DPR, called and spoke to Kenney Houseman, daughter regarding Losartan. She says her mom has not taken this medication since August and [facility] since [facility]. I advised it was sent in last in September with 0 refills, so she should've gotten a refill in December. She asks if Dr. Sherrie Mustache still wants her to be on the medication to have [facility]. Advised I will send this to Dr. Sherrie Mustache.   Summary: Medication Losartan   Daughter is asking a Question regarding mothers medication losartan (COZAAR) 100 MG tablet.  Has it been discontinued?     Reason for Disposition  [1] Caller has NON-URGENT medicine question about med that PCP prescribed AND [2] triager unable to answer question  Answer Assessment - Initial Assessment Questions 1. NAME of MEDICINE: "What medicine(s) are you calling about?"     Losartan 2. QUESTION: "What is your question?" (e.g., double dose of medicine, side effect)     Was it discontinued 3. PRESCRIBER: "Who prescribed the medicine?" Reason: if prescribed by specialist, call should be referred to that group.     Dr. Sullivan Lone 4. SYMPTOMS: "Do you have any symptoms?" If Yes, ask: "What symptoms are you having?"  "How bad are the symptoms (e.g., mild, moderate, severe)     None  Protocols used: Medication Question Call-A-AH

## 2022-09-07 ENCOUNTER — Ambulatory Visit: Payer: Self-pay | Admitting: *Deleted

## 2022-09-07 MED ORDER — LOSARTAN POTASSIUM 100 MG PO TABS: 100.0000 mg | ORAL_TABLET | Freq: Every day | ORAL | 1 refills | Status: DC

## 2022-09-07 NOTE — Telephone Encounter (Signed)
Pt's daughter returned call. Message from Dr. Sherrie Mustache read, verbalizes understanding. F/U appt secured for 10/01/22 per daughters schedule.

## 2022-09-07 NOTE — Telephone Encounter (Signed)
Yes, she needs to start back on losartan, [facility]. Please schedule follow up office visit 4 weeks.

## 2022-09-07 NOTE — Telephone Encounter (Signed)
Error

## 2022-09-07 NOTE — Telephone Encounter (Signed)
Tried returning daughters call. Left message to call back. OK for [facility] triage to advise and schedule follow up appointment.

## 2022-10-01 ENCOUNTER — Ambulatory Visit: Payer: Medicare PPO | Admitting: Family Medicine

## 2022-10-14 ENCOUNTER — Emergency Department (HOSPITAL_COMMUNITY): Payer: Medicare PPO

## 2022-10-14 ENCOUNTER — Inpatient Hospital Stay (HOSPITAL_COMMUNITY)
Admission: EM | Admit: 2022-10-14 | Discharge: 2022-10-19 | DRG: 522 | Disposition: A | Discharge status: Skilled Nursing Facility | Payer: Medicare PPO | Attending: Family Medicine | Admitting: Family Medicine

## 2022-10-14 ENCOUNTER — Other Ambulatory Visit: Payer: Self-pay

## 2022-10-14 ENCOUNTER — Encounter (HOSPITAL_COMMUNITY)
Admission: EM | Disposition: A | Discharge status: Skilled Nursing Facility | Payer: Self-pay | Source: Home / Self Care | Attending: Family Medicine

## 2022-10-14 ENCOUNTER — Inpatient Hospital Stay (HOSPITAL_COMMUNITY): Payer: Medicare PPO

## 2022-10-14 ENCOUNTER — Encounter (HOSPITAL_COMMUNITY): Payer: Self-pay

## 2022-10-14 ENCOUNTER — Inpatient Hospital Stay (HOSPITAL_COMMUNITY): Payer: Medicare PPO | Admitting: Certified Registered"

## 2022-10-14 DIAGNOSIS — I517 Cardiomegaly: Secondary | ICD-10-CM | POA: Diagnosis not present

## 2022-10-14 DIAGNOSIS — W1830XA Fall on same level, unspecified, initial encounter: Secondary | ICD-10-CM | POA: Diagnosis present

## 2022-10-14 DIAGNOSIS — R140 Abdominal distension (gaseous): Secondary | ICD-10-CM | POA: Diagnosis not present

## 2022-10-14 DIAGNOSIS — R930 Abnormal findings on diagnostic imaging of skull and head, not elsewhere classified: Secondary | ICD-10-CM | POA: Diagnosis not present

## 2022-10-14 DIAGNOSIS — D72819 Decreased white blood cell count, unspecified: Secondary | ICD-10-CM | POA: Diagnosis not present

## 2022-10-14 DIAGNOSIS — D696 Thrombocytopenia, unspecified: Secondary | ICD-10-CM | POA: Diagnosis present

## 2022-10-14 DIAGNOSIS — Z7401 Bed confinement status: Secondary | ICD-10-CM | POA: Diagnosis not present

## 2022-10-14 DIAGNOSIS — S72002A Fracture of unspecified part of neck of left femur, initial encounter for closed fracture: Principal | ICD-10-CM | POA: Diagnosis present

## 2022-10-14 DIAGNOSIS — E785 Hyperlipidemia, unspecified: Secondary | ICD-10-CM | POA: Diagnosis present

## 2022-10-14 DIAGNOSIS — F0390 Unspecified dementia without behavioral disturbance: Secondary | ICD-10-CM | POA: Insufficient documentation

## 2022-10-14 DIAGNOSIS — R609 Edema, unspecified: Secondary | ICD-10-CM | POA: Diagnosis not present

## 2022-10-14 DIAGNOSIS — R197 Diarrhea, unspecified: Secondary | ICD-10-CM | POA: Diagnosis not present

## 2022-10-14 DIAGNOSIS — Z7901 Long term (current) use of anticoagulants: Secondary | ICD-10-CM | POA: Diagnosis not present

## 2022-10-14 DIAGNOSIS — D509 Iron deficiency anemia, unspecified: Secondary | ICD-10-CM | POA: Diagnosis present

## 2022-10-14 DIAGNOSIS — D638 Anemia in other chronic diseases classified elsewhere: Secondary | ICD-10-CM | POA: Diagnosis not present

## 2022-10-14 DIAGNOSIS — Z8249 Family history of ischemic heart disease and other diseases of the circulatory system: Secondary | ICD-10-CM | POA: Diagnosis not present

## 2022-10-14 DIAGNOSIS — Y92009 Unspecified place in unspecified non-institutional (private) residence as the place of occurrence of the external cause: Secondary | ICD-10-CM

## 2022-10-14 DIAGNOSIS — Z79899 Other long term (current) drug therapy: Secondary | ICD-10-CM | POA: Diagnosis not present

## 2022-10-14 DIAGNOSIS — Z96641 Presence of right artificial hip joint: Secondary | ICD-10-CM | POA: Diagnosis not present

## 2022-10-14 DIAGNOSIS — M25552 Pain in left hip: Secondary | ICD-10-CM | POA: Diagnosis not present

## 2022-10-14 DIAGNOSIS — D61818 Other pancytopenia: Secondary | ICD-10-CM | POA: Diagnosis present

## 2022-10-14 DIAGNOSIS — E039 Hypothyroidism, unspecified: Secondary | ICD-10-CM

## 2022-10-14 DIAGNOSIS — E7800 Pure hypercholesterolemia, unspecified: Secondary | ICD-10-CM | POA: Diagnosis not present

## 2022-10-14 DIAGNOSIS — I10 Essential (primary) hypertension: Secondary | ICD-10-CM | POA: Diagnosis not present

## 2022-10-14 DIAGNOSIS — F03A3 Unspecified dementia, mild, with mood disturbance: Secondary | ICD-10-CM | POA: Diagnosis not present

## 2022-10-14 DIAGNOSIS — W19XXXA Unspecified fall, initial encounter: Secondary | ICD-10-CM | POA: Diagnosis not present

## 2022-10-14 DIAGNOSIS — Z471 Aftercare following joint replacement surgery: Secondary | ICD-10-CM | POA: Diagnosis not present

## 2022-10-14 DIAGNOSIS — I160 Hypertensive urgency: Secondary | ICD-10-CM | POA: Diagnosis not present

## 2022-10-14 DIAGNOSIS — F32A Depression, unspecified: Secondary | ICD-10-CM | POA: Diagnosis not present

## 2022-10-14 DIAGNOSIS — Z1152 Encounter for screening for COVID-19: Secondary | ICD-10-CM

## 2022-10-14 DIAGNOSIS — Z96642 Presence of left artificial hip joint: Secondary | ICD-10-CM | POA: Diagnosis not present

## 2022-10-14 DIAGNOSIS — Z803 Family history of malignant neoplasm of breast: Secondary | ICD-10-CM | POA: Diagnosis not present

## 2022-10-14 DIAGNOSIS — Z79890 Hormone replacement therapy (postmenopausal): Secondary | ICD-10-CM

## 2022-10-14 DIAGNOSIS — W19XXXD Unspecified fall, subsequent encounter: Secondary | ICD-10-CM | POA: Diagnosis not present

## 2022-10-14 DIAGNOSIS — G238 Other specified degenerative diseases of basal ganglia: Secondary | ICD-10-CM | POA: Diagnosis not present

## 2022-10-14 DIAGNOSIS — S72002D Fracture of unspecified part of neck of left femur, subsequent encounter for closed fracture with routine healing: Secondary | ICD-10-CM | POA: Diagnosis not present

## 2022-10-14 HISTORY — PX: HIP ARTHROPLASTY: SHX981

## 2022-10-14 LAB — GLUCOSE, CAPILLARY: Glucose-Capillary: 149 mg/dL — ABNORMAL HIGH (ref 70–99)

## 2022-10-14 LAB — ABO/RH: ABO/RH(D): A POS

## 2022-10-14 LAB — CBC WITH DIFFERENTIAL/PLATELET
Abs Immature Granulocytes: 0.02 10*3/uL (ref 0.00–0.07)
Basophils Absolute: 0 10*3/uL (ref 0.0–0.1)
Basophils Relative: 1 %
Eosinophils Absolute: 0.1 10*3/uL (ref 0.0–0.5)
Eosinophils Relative: 6 %
HCT: 38.9 % (ref 36.0–46.0)
Hemoglobin: 12.5 g/dL (ref 12.0–15.0)
Immature Granulocytes: 1 %
Lymphocytes Relative: 23 %
Lymphs Abs: 0.5 10*3/uL — ABNORMAL LOW (ref 0.7–4.0)
MCH: 28.5 pg (ref 26.0–34.0)
MCHC: 32.1 g/dL (ref 30.0–36.0)
MCV: 88.8 fL (ref 80.0–100.0)
Monocytes Absolute: 0.2 10*3/uL (ref 0.1–1.0)
Monocytes Relative: 11 %
Neutro Abs: 1.3 10*3/uL — ABNORMAL LOW (ref 1.7–7.7)
Neutrophils Relative %: 58 %
Platelets: 77 10*3/uL — ABNORMAL LOW (ref 150–400)
RBC: 4.38 MIL/uL (ref 3.87–5.11)
RDW: 20.6 % — ABNORMAL HIGH (ref 11.5–15.5)
WBC: 2.2 10*3/uL — ABNORMAL LOW (ref 4.0–10.5)
nRBC: 0 % (ref 0.0–0.2)

## 2022-10-14 LAB — BASIC METABOLIC PANEL
Anion gap: 10 (ref 5–15)
BUN: 15 mg/dL (ref 8–23)
CO2: 24 mmol/L (ref 22–32)
Calcium: 8.8 mg/dL — ABNORMAL LOW (ref 8.9–10.3)
Chloride: 104 mmol/L (ref 98–111)
Creatinine, Ser: 0.68 mg/dL (ref 0.44–1.00)
GFR, Estimated: >60 mL/min (ref >60)
Glucose, Bld: 110 mg/dL — ABNORMAL HIGH (ref 70–99)
Potassium: 3.6 mmol/L (ref 3.5–5.1)
Sodium: 138 mmol/L (ref 135–145)

## 2022-10-14 LAB — PHOSPHORUS: Phosphorus: 3.1 mg/dL (ref 2.5–4.6)

## 2022-10-14 LAB — TYPE AND SCREEN
ABO/RH(D): A POS
Unit division: 0

## 2022-10-14 LAB — PREPARE RBC (CROSSMATCH)

## 2022-10-14 LAB — MAGNESIUM: Magnesium: 1.9 mg/dL (ref 1.7–2.4)

## 2022-10-14 LAB — BPAM RBC
Blood Product Expiration Date: 202407182359
Unit Type and Rh: 6200

## 2022-10-14 SURGERY — HEMIARTHROPLASTY, HIP, DIRECT ANTERIOR APPROACH, FOR FRACTURE
Anesthesia: General | Site: Hip | Laterality: Left

## 2022-10-14 MED ORDER — SODIUM CHLORIDE 0.9 % IV SOLN: INTRAVENOUS | Status: DC

## 2022-10-14 MED ORDER — ROCURONIUM BROMIDE 10 MG/ML (PF) SYRINGE
PREFILLED_SYRINGE | INTRAVENOUS | Status: AC
  Filled 2022-10-14: qty 10

## 2022-10-14 MED ORDER — TRAMADOL HCL 50 MG PO TABS
50.0000 mg | ORAL_TABLET | Freq: Four times a day (QID) | ORAL | Status: DC
  Administered 2022-10-14 – 2022-10-19 (×20): 50 mg via ORAL
  Filled 2022-10-14 (×20): qty 1

## 2022-10-14 MED ORDER — METOPROLOL SUCCINATE ER 50 MG PO TB24
100.0000 mg | ORAL_TABLET | Freq: Every day | ORAL | Status: DC
  Administered 2022-10-14 – 2022-10-19 (×6): 100 mg via ORAL
  Filled 2022-10-14 (×6): qty 2

## 2022-10-14 MED ORDER — MENTHOL 3 MG MT LOZG: 1.0000 | LOZENGE | OROMUCOSAL | Status: DC | PRN

## 2022-10-14 MED ORDER — PHENYLEPHRINE 80 MCG/ML (10ML) SYRINGE FOR IV PUSH (FOR BLOOD PRESSURE SUPPORT)
PREFILLED_SYRINGE | INTRAVENOUS | Status: AC
  Filled 2022-10-14: qty 10

## 2022-10-14 MED ORDER — ASPIRIN 325 MG PO TBEC
325.0000 mg | DELAYED_RELEASE_TABLET | Freq: Every day | ORAL | Status: DC
  Administered 2022-10-15 – 2022-10-16 (×2): 325 mg via ORAL
  Filled 2022-10-14 (×2): qty 1

## 2022-10-14 MED ORDER — METHOCARBAMOL 1000 MG/10ML IJ SOLN: 500.0000 mg | Freq: Four times a day (QID) | INTRAVENOUS | Status: DC | PRN

## 2022-10-14 MED ORDER — LOSARTAN POTASSIUM 50 MG PO TABS
100.0000 mg | ORAL_TABLET | Freq: Every day | ORAL | Status: DC
  Administered 2022-10-14 – 2022-10-19 (×6): 100 mg via ORAL
  Filled 2022-10-14 (×7): qty 2

## 2022-10-14 MED ORDER — METOCLOPRAMIDE HCL 10 MG PO TABS: 5.0000 mg | ORAL_TABLET | Freq: Three times a day (TID) | ORAL | Status: DC | PRN

## 2022-10-14 MED ORDER — ORAL CARE MOUTH RINSE: 15.0000 mL | Freq: Once | OROMUCOSAL | Status: AC

## 2022-10-14 MED ORDER — LIDOCAINE HCL (PF) 2 % IJ SOLN
INTRAMUSCULAR | Status: AC
  Filled 2022-10-14: qty 5

## 2022-10-14 MED ORDER — EPHEDRINE 5 MG/ML INJ
INTRAVENOUS | Status: AC
  Filled 2022-10-14: qty 5

## 2022-10-14 MED ORDER — CHLORHEXIDINE GLUCONATE 0.12 % MT SOLN
15.0000 mL | Freq: Once | OROMUCOSAL | Status: AC
  Administered 2022-10-14: 15 mL via OROMUCOSAL

## 2022-10-14 MED ORDER — 0.9 % SODIUM CHLORIDE (POUR BTL) OPTIME
TOPICAL | Status: DC | PRN
  Administered 2022-10-14: 1000 mL

## 2022-10-14 MED ORDER — OXYCODONE HCL 5 MG PO TABS: 5.0000 mg | ORAL_TABLET | Freq: Once | ORAL | Status: DC | PRN

## 2022-10-14 MED ORDER — SUGAMMADEX SODIUM 200 MG/2ML IV SOLN
INTRAVENOUS | Status: DC | PRN
  Administered 2022-10-14: 200 mg via INTRAVENOUS

## 2022-10-14 MED ORDER — CHLORHEXIDINE GLUCONATE 4 % EX SOLN: 60.0000 mL | Freq: Once | CUTANEOUS | Status: DC

## 2022-10-14 MED ORDER — ONDANSETRON HCL 4 MG/2ML IJ SOLN
INTRAMUSCULAR | Status: DC | PRN
  Administered 2022-10-14: 4 mg via INTRAVENOUS

## 2022-10-14 MED ORDER — TRANEXAMIC ACID-NACL 1000-0.7 MG/100ML-% IV SOLN
1000.0000 mg | Freq: Once | INTRAVENOUS | Status: AC
  Administered 2022-10-14: 1000 mg via INTRAVENOUS
  Filled 2022-10-14: qty 100

## 2022-10-14 MED ORDER — SODIUM CHLORIDE 0.9% IV SOLUTION: Freq: Once | INTRAVENOUS | Status: DC

## 2022-10-14 MED ORDER — ONDANSETRON HCL 4 MG/2ML IJ SOLN
INTRAMUSCULAR | Status: AC
  Filled 2022-10-14: qty 2

## 2022-10-14 MED ORDER — PHENYLEPHRINE HCL-NACL 20-0.9 MG/250ML-% IV SOLN
INTRAVENOUS | Status: AC
  Filled 2022-10-14: qty 250

## 2022-10-14 MED ORDER — PROPOFOL 10 MG/ML IV BOLUS
INTRAVENOUS | Status: AC
  Filled 2022-10-14: qty 20

## 2022-10-14 MED ORDER — ROCURONIUM BROMIDE 100 MG/10ML IV SOLN
INTRAVENOUS | Status: DC | PRN
  Administered 2022-10-14: 10 mg via INTRAVENOUS
  Administered 2022-10-14: 50 mg via INTRAVENOUS

## 2022-10-14 MED ORDER — FENTANYL CITRATE (PF) 100 MCG/2ML IJ SOLN
INTRAMUSCULAR | Status: DC | PRN
  Administered 2022-10-14: 25 ug via INTRAVENOUS
  Administered 2022-10-14: 100 ug via INTRAVENOUS

## 2022-10-14 MED ORDER — ONDANSETRON HCL 4 MG/2ML IJ SOLN: 4.0000 mg | Freq: Four times a day (QID) | INTRAMUSCULAR | Status: DC | PRN

## 2022-10-14 MED ORDER — PHENOL 1.4 % MT LIQD: 1.0000 | OROMUCOSAL | Status: DC | PRN

## 2022-10-14 MED ORDER — ACETAMINOPHEN 325 MG PO TABS: 650.0000 mg | ORAL_TABLET | Freq: Four times a day (QID) | ORAL | Status: DC | PRN

## 2022-10-14 MED ORDER — DEXMEDETOMIDINE HCL IN NACL 80 MCG/20ML IV SOLN
INTRAVENOUS | Status: DC | PRN
  Administered 2022-10-14: 4 ug via INTRAVENOUS

## 2022-10-14 MED ORDER — ACETAMINOPHEN 650 MG RE SUPP: 650.0000 mg | Freq: Four times a day (QID) | RECTAL | Status: DC | PRN

## 2022-10-14 MED ORDER — OXYCODONE HCL 5 MG/5ML PO SOLN: 5.0000 mg | Freq: Once | ORAL | Status: DC | PRN

## 2022-10-14 MED ORDER — POLYETHYLENE GLYCOL 3350 17 G PO PACK: 17.0000 g | PACK | Freq: Every day | ORAL | Status: DC | PRN

## 2022-10-14 MED ORDER — BUPIVACAINE HCL (PF) 0.5 % IJ SOLN
INTRAMUSCULAR | Status: AC
  Filled 2022-10-14: qty 30

## 2022-10-14 MED ORDER — METHOCARBAMOL 500 MG PO TABS: 500.0000 mg | ORAL_TABLET | Freq: Four times a day (QID) | ORAL | Status: DC | PRN

## 2022-10-14 MED ORDER — FENTANYL CITRATE (PF) 100 MCG/2ML IJ SOLN
INTRAMUSCULAR | Status: AC
  Filled 2022-10-14: qty 2

## 2022-10-14 MED ORDER — BUPIVACAINE HCL (PF) 0.5 % IJ SOLN
INTRAMUSCULAR | Status: DC | PRN
  Administered 2022-10-14: 30 mL

## 2022-10-14 MED ORDER — ONDANSETRON HCL 4 MG PO TABS: 4.0000 mg | ORAL_TABLET | Freq: Four times a day (QID) | ORAL | Status: DC | PRN

## 2022-10-14 MED ORDER — ACETAMINOPHEN 500 MG PO TABS
500.0000 mg | ORAL_TABLET | Freq: Four times a day (QID) | ORAL | Status: DC
  Administered 2022-10-14 – 2022-10-19 (×20): 500 mg via ORAL
  Filled 2022-10-14 (×20): qty 1

## 2022-10-14 MED ORDER — PROPOFOL 500 MG/50ML IV EMUL
INTRAVENOUS | Status: AC
  Filled 2022-10-14: qty 50

## 2022-10-14 MED ORDER — CEFAZOLIN SODIUM-DEXTROSE 2-4 GM/100ML-% IV SOLN
2.0000 g | INTRAVENOUS | Status: AC
  Administered 2022-10-14: 2 g via INTRAVENOUS
  Filled 2022-10-14: qty 100

## 2022-10-14 MED ORDER — TRANEXAMIC ACID-NACL 1000-0.7 MG/100ML-% IV SOLN
1000.0000 mg | INTRAVENOUS | Status: AC
  Administered 2022-10-14: 1000 mg via INTRAVENOUS
  Filled 2022-10-14: qty 100

## 2022-10-14 MED ORDER — DONEPEZIL HCL 5 MG PO TABS
10.0000 mg | ORAL_TABLET | Freq: Every day | ORAL | Status: DC
  Administered 2022-10-14 – 2022-10-18 (×5): 10 mg via ORAL
  Filled 2022-10-14 (×5): qty 2

## 2022-10-14 MED ORDER — DOCUSATE SODIUM 100 MG PO CAPS
100.0000 mg | ORAL_CAPSULE | Freq: Two times a day (BID) | ORAL | Status: DC
  Administered 2022-10-14 – 2022-10-15 (×2): 100 mg via ORAL
  Filled 2022-10-14 (×2): qty 1

## 2022-10-14 MED ORDER — DEXAMETHASONE SODIUM PHOSPHATE 4 MG/ML IJ SOLN
INTRAMUSCULAR | Status: DC | PRN
  Administered 2022-10-14: 5 mg via INTRAVENOUS

## 2022-10-14 MED ORDER — HYDRALAZINE HCL 20 MG/ML IJ SOLN
10.0000 mg | Freq: Four times a day (QID) | INTRAMUSCULAR | Status: DC | PRN
  Administered 2022-10-14 – 2022-10-18 (×2): 10 mg via INTRAVENOUS
  Filled 2022-10-14 (×2): qty 1

## 2022-10-14 MED ORDER — LABETALOL HCL 5 MG/ML IV SOLN
10.0000 mg | Freq: Once | INTRAVENOUS | Status: AC
  Administered 2022-10-14: 10 mg via INTRAVENOUS
  Filled 2022-10-14: qty 4

## 2022-10-14 MED ORDER — ONDANSETRON HCL 4 MG/2ML IJ SOLN: 4.0000 mg | Freq: Once | INTRAMUSCULAR | Status: DC | PRN

## 2022-10-14 MED ORDER — ONDANSETRON HCL 4 MG/2ML IJ SOLN
4.0000 mg | Freq: Four times a day (QID) | INTRAMUSCULAR | Status: DC | PRN
  Administered 2022-10-14: 4 mg via INTRAVENOUS
  Filled 2022-10-14: qty 2

## 2022-10-14 MED ORDER — EPHEDRINE SULFATE-NACL 50-0.9 MG/10ML-% IV SOSY
PREFILLED_SYRINGE | INTRAVENOUS | Status: DC | PRN
  Administered 2022-10-14: 10 mg via INTRAVENOUS
  Administered 2022-10-14 (×2): 5 mg via INTRAVENOUS

## 2022-10-14 MED ORDER — PROPOFOL 10 MG/ML IV BOLUS
INTRAVENOUS | Status: DC | PRN
  Administered 2022-10-14: 100 mg via INTRAVENOUS

## 2022-10-14 MED ORDER — SODIUM CHLORIDE 0.9 % IR SOLN
Status: DC | PRN
  Administered 2022-10-14: 3000 mL

## 2022-10-14 MED ORDER — FENTANYL CITRATE PF 50 MCG/ML IJ SOSY: 25.0000 ug | PREFILLED_SYRINGE | INTRAMUSCULAR | Status: DC | PRN

## 2022-10-14 MED ORDER — ONDANSETRON HCL 4 MG/2ML IJ SOLN
4.0000 mg | Freq: Once | INTRAMUSCULAR | Status: AC
  Administered 2022-10-14: 4 mg via INTRAVENOUS
  Filled 2022-10-14: qty 2

## 2022-10-14 MED ORDER — LACTATED RINGERS IV SOLN
INTRAVENOUS | Status: DC
  Administered 2022-10-14: 1000 mL via INTRAVENOUS

## 2022-10-14 MED ORDER — MORPHINE SULFATE (PF) 2 MG/ML IV SOLN
2.0000 mg | INTRAVENOUS | Status: DC | PRN
  Administered 2022-10-14: 2 mg via INTRAVENOUS
  Filled 2022-10-14: qty 1

## 2022-10-14 MED ORDER — METOCLOPRAMIDE HCL 5 MG/ML IJ SOLN: 5.0000 mg | Freq: Three times a day (TID) | INTRAMUSCULAR | Status: DC | PRN

## 2022-10-14 MED ORDER — MORPHINE SULFATE (PF) 4 MG/ML IV SOLN
4.0000 mg | Freq: Once | INTRAVENOUS | Status: AC
  Administered 2022-10-14: 4 mg via INTRAVENOUS
  Filled 2022-10-14: qty 1

## 2022-10-14 MED ORDER — POVIDONE-IODINE 10 % EX SWAB: 2.0000 | Freq: Once | CUTANEOUS | Status: DC

## 2022-10-14 MED ORDER — FERROUS FUMARATE 324 (106 FE) MG PO TABS
106.0000 mg | ORAL_TABLET | Freq: Every day | ORAL | Status: DC
  Administered 2022-10-15 – 2022-10-19 (×5): 106 mg via ORAL
  Filled 2022-10-14 (×6): qty 1

## 2022-10-14 MED ORDER — CEFAZOLIN SODIUM-DEXTROSE 2-4 GM/100ML-% IV SOLN
2.0000 g | Freq: Four times a day (QID) | INTRAVENOUS | Status: AC
  Administered 2022-10-14 (×2): 2 g via INTRAVENOUS
  Filled 2022-10-14 (×2): qty 100

## 2022-10-14 MED ORDER — LEVOTHYROXINE SODIUM 112 MCG PO TABS
112.0000 ug | ORAL_TABLET | Freq: Every day | ORAL | Status: DC
  Administered 2022-10-15 – 2022-10-19 (×5): 112 ug via ORAL
  Filled 2022-10-14 (×5): qty 1

## 2022-10-14 SURGICAL SUPPLY — 51 items
APL PRP STRL LF DISP 70% ISPRP (MISCELLANEOUS) ×2
BIPOLAR PROS AML 46 (Hips) ×1 IMPLANT
BIT DRILL 2.8X128 (BIT) ×1 IMPLANT
BLADE SAGITTAL 25.0X1.27X90 (BLADE) ×1 IMPLANT
CHLORAPREP W/TINT 26 (MISCELLANEOUS) ×1 IMPLANT
CLOTH BEACON ORANGE TIMEOUT ST (SAFETY) ×1 IMPLANT
COVER LIGHT HANDLE STERIS (MISCELLANEOUS) ×2 IMPLANT
DRAPE HIP W/POCKET STRL (MISCELLANEOUS) ×1 IMPLANT
DRAPE U-SHAPE 47X51 STRL (DRAPES) ×1 IMPLANT
DRSG MEPILEX BORDER 4X12 (GAUZE/BANDAGES/DRESSINGS) ×1 IMPLANT
DRSG MEPILEX POST OP 4X12 (GAUZE/BANDAGES/DRESSINGS) IMPLANT
DRSG MEPILEX SACRM 8.7X9.8 (GAUZE/BANDAGES/DRESSINGS) ×1 IMPLANT
ELECT REM PT RETURN 9FT ADLT (ELECTROSURGICAL) ×1
ELECTRODE REM PT RTRN 9FT ADLT (ELECTROSURGICAL) ×1 IMPLANT
GLOVE BIOGEL PI IND STRL 7.0 (GLOVE) ×2 IMPLANT
GLOVE ECLIPSE 6.5 STRL STRAW (GLOVE) IMPLANT
GLOVE SKINSENSE STRL SZ8.0 LF (GLOVE) IMPLANT
GLOVE SS N UNI LF 8.5 STRL (GLOVE) ×1 IMPLANT
GOWN STRL REUS W/TWL LRG LVL3 (GOWN DISPOSABLE) ×2 IMPLANT
GOWN STRL REUS W/TWL XL LVL3 (GOWN DISPOSABLE) ×1 IMPLANT
HANDPIECE INTERPULSE COAX TIP (DISPOSABLE) ×1
HEAD BIPOLAR PROS AML 46 (Hips) IMPLANT
HEAD FEM STD 28X+1.5 STRL (Hips) IMPLANT
INST SET MAJOR BONE (KITS) ×1 IMPLANT
KIT BLADEGUARD II DBL (SET/KITS/TRAYS/PACK) ×1 IMPLANT
KIT TURNOVER KIT A (KITS) ×1 IMPLANT
MANIFOLD NEPTUNE II (INSTRUMENTS) ×1 IMPLANT
MARKER SKIN DUAL TIP RULER LAB (MISCELLANEOUS) ×1 IMPLANT
NDL HYPO 21X1.5 SAFETY (NEEDLE) ×1 IMPLANT
NDL MAYO 6 CRC TAPER PT (NEEDLE) IMPLANT
NEEDLE HYPO 21X1.5 SAFETY (NEEDLE) ×1 IMPLANT
NEEDLE MAYO 6 CRC TAPER PT (NEEDLE) ×1 IMPLANT
NS IRRIG 1000ML POUR BTL (IV SOLUTION) ×1 IMPLANT
PACK TOTAL JOINT (CUSTOM PROCEDURE TRAY) ×1 IMPLANT
PAD ARMBOARD 7.5X6 YLW CONV (MISCELLANEOUS) ×1 IMPLANT
PASSER SUT SWANSON 36MM LOOP (INSTRUMENTS) IMPLANT
SET BASIN LINEN APH (SET/KITS/TRAYS/PACK) ×1 IMPLANT
SET HNDPC FAN SPRY TIP SCT (DISPOSABLE) ×1 IMPLANT
STAPLER VISISTAT 35W (STAPLE) ×1 IMPLANT
STEM SUMMIT BASIC PRESSFIT SZ5 (Hips) IMPLANT
STRIP CLOSURE SKIN 1/2X4 (GAUZE/BANDAGES/DRESSINGS) IMPLANT
SUT BRALON NAB BRD #1 30IN (SUTURE) ×2 IMPLANT
SUT ETHIBOND 5 LR DA (SUTURE) ×2 IMPLANT
SUT MNCRL 0 VIOLET CTX 36 (SUTURE) ×1 IMPLANT
SUT MON AB 0 CT1 (SUTURE) IMPLANT
SUT VIC AB 1 CT1 27 (SUTURE) ×5
SUT VIC AB 1 CT1 27XBRD ANTBC (SUTURE) ×2 IMPLANT
SYR BULB IRRIG 60ML STRL (SYRINGE) ×1 IMPLANT
TRAY FOLEY MTR SLVR 16FR STAT (SET/KITS/TRAYS/PACK) ×1 IMPLANT
WATER STERILE IRR 1000ML POUR (IV SOLUTION) ×2 IMPLANT
YANKAUER SUCT 12FT TUBE ARGYLE (SUCTIONS) ×1 IMPLANT

## 2022-10-14 NOTE — TOC Initial Note (Signed)
Transition of Care [facility]) - Initial/Assessment Note    Patient Details  Name: Lauren Lawrence MRN: [id number] Date of Birth: [birth year]  Transition of Care [facility]) CM/SW Contact:    Leitha Bleak, RN Phone Number: 10/14/2022, 12:45 PM  Clinical Narrative:      Patient admitted after a fall with Displaced fracture of left femoral neck. Surgery schedule for this afternoon. CM spoke with her daughter, Kenney Houseman to get SNF bed choices. She request to expand the search to the [place] area. TOC to follow for PT eval, to discuss bed offers and start INS AUTH.              Expected Discharge Plan: Skilled Nursing Facility Barriers to Discharge: Continued Medical Work up   Patient Goals and CMS Choice Patient states their goals for this hospitalization and ongoing recovery are:: agreeable to SNF CMS Medicare.gov Compare Post Acute Care list provided to:: Patient Represenative (must comment) Choice offered to / list presented to : Adult Children      Expected Discharge Plan and Services     Post Acute Care Choice: Skilled Nursing Facility Living arrangements for the past 2 months: Single Family Home                                      Prior Living Arrangements/Services Living arrangements for the past 2 months: Single Family Home Lives with:: Adult Children Patient language and need for interpreter reviewed:: Yes        Need for Family Participation in Patient Care: Yes (Comment) Care giver support system in place?: Yes (comment)   Criminal Activity/Legal Involvement Pertinent to Current Situation/Hospitalization: No - Comment as needed  Activities of Daily Living Home Assistive Devices/Equipment: None ADL Screening (condition at time of admission) Patient's cognitive ability adequate to safely complete daily activities?: Yes Is the patient deaf or have difficulty hearing?: No Does the patient have difficulty seeing, even when wearing glasses/contacts?: No Does the patient have  difficulty concentrating, remembering, or making decisions?: No Patient able to express need for assistance with ADLs?: Yes Does the patient have difficulty dressing or bathing?: Yes Independently performs ADLs?: No Communication: Independent Dressing (OT): Needs assistance Is this a change from baseline?: Change from baseline, expected to last <3days Grooming: Needs assistance Is this a change from baseline?: Change from baseline, expected to last >3 days Feeding: Independent Bathing: Needs assistance Is this a change from baseline?: Change from baseline, expected to last >3 days Toileting: Needs assistance Is this a change from baseline?: Change from baseline, expected to last >3days In/Out Bed: Needs assistance Is this a change from baseline?: Change from baseline, expected to last >3 days Walks in Home: Needs assistance Is this a change from baseline?: Change from baseline, expected to last >3 days Does the patient have difficulty walking or climbing stairs?: Yes Weakness of Legs: Both Weakness of Arms/Lawrence: Both  Permission Sought/Granted      Share Information with NAME: Kenney Houseman     Permission granted to share info w Relationship: Daughter     Emotional Assessment     Affect (typically observed): Accepting Orientation: : Oriented to Self, Oriented to Place, Oriented to Situation Alcohol / Substance Use: Not Applicable Psych Involvement: No (comment)  Admission diagnosis:  Displaced fracture of left femoral neck (HCC) [S72.002A] Closed fracture of left hip, initial encounter [facility]) [S72.002A] Patient Active Problem List   Diagnosis Date Noted  Displaced fracture of left femoral neck (HCC) 10/14/2022   Hypertensive urgency 10/14/2022   Dementia without behavioral disturbance (HCC) 10/14/2022   Iron deficiency anemia 05/14/2022   Maculopapular rash, localized 07/03/2021   Memory loss 05/11/2021   Thrombocytopenia (HCC) 07/10/2019   Skin trauma 07/10/2019   Allergic  rhinitis 10/17/2014   Cyst of thyroid 10/17/2014   Clinical depression 10/17/2014   Essential (primary) hypertension 10/17/2014   Fatty infiltration of liver 10/17/2014   Acquired hypothyroidism 10/17/2014   Leukopenia 10/17/2014   OP (osteoporosis) 10/17/2014   Enlargement of spleen 10/17/2014   Phlebectasia 10/17/2014   Avitaminosis D 10/17/2014   HLD (hyperlipidemia) 07/25/2014   PCP:  Malva Limes, MD [facility] [address],  - [address] [place] [address] Phone: [phone] Fax: [phone]     Social Determinants of Health (SDOH) Social History: SDOH Screenings   Food Insecurity: No Food Insecurity (10/14/2022)  Housing: Low Risk  (10/14/2022)  Transportation Needs: No Transportation Needs (10/14/2022)  Utilities: Not At Risk (10/14/2022)  Alcohol Screen: Low Risk  (05/14/2022)  Depression (PHQ2-9): High Risk (08/16/2022)  Financial Resource Strain: Low Risk  (07/05/2022)  Physical Activity: Insufficiently Active (07/05/2022)  Social Connections: Unknown (07/05/2022)  Stress: No Stress Concern Present (07/05/2022)  Tobacco Use: Low Risk  (10/14/2022)   SDOH Interventions:    Readmission Risk Interventions     No data to display

## 2022-10-14 NOTE — Consult Note (Signed)
 ORTHOPAEDIC CONSULTATION  REQUESTING PHYSICIAN: Emokpae, Courage, MD  ASSESSMENT AND PLAN: 82 y.o. female with the following: Unwitnessed fall at home patient found by her daughter who she lives with.  She does not remember how she fell.  Complained of left hip pain.  She was unable to ambulate.  She is brought to the emergency room for evaluation and management  She was found to have a left femoral neck fracture  She complains of severe pain in the left hip that is nonradiating is over the groin and left greater trochanter it is increased with movement.  This patient requires inpatient admission to manage this problem appropriately. Orthopedics recommends admission to a medical service and we will provide consultation and follow along  Diagnosis left femoral neck fracture  Plan bipolar hip replacement  The procedure has been fully reviewed with the patient; The risks and benefits of surgery have been discussed and explained and understood. Alternative treatment has also been reviewed, questions were encouraged and answered. The postoperative plan is also been reviewed.  Confounding variables include thrombocytopenia and iron deficiency anemia.  I have asked the patient to be crossmatched for blood and platelets    Chief Complaint: Left hip pain  HPI: Lauren Lawrence is a 82 y.o. female see above for history, she has iron deficiency anemia thrombocytopenia this has been worked up by hematology with no obvious cause for the thrombocytopenia she is placed on iron for the iron deficiency.  She does not use a cane or walker  Past Medical History:  Diagnosis Date   Depression    Hyperlipidemia    Hypertension    Memory loss    Past Surgical History:  Procedure Laterality Date   CATARACT EXTRACTION  03/2020   Social History   Socioeconomic History   Marital status: Widowed    Spouse name: Cecil   Number of children: 2   Years of education: 16   Highest education level:  Bachelor's degree (e.g., BA, AB, BS)  Occupational History   Occupation: retired  Tobacco Use   Smoking status: Never   Smokeless tobacco: Never  Vaping Use   Vaping Use: Never used  Substance and Sexual Activity   Alcohol use: No   Drug use: No   Sexual activity: Never  Other Topics Concern   Not on file  Social History Narrative   08/11/20 Lives at home. One of her daughters, Teresa lives with her.    Right handed   Caffeine: 1 cup/day   Social Determinants of Health   Financial Resource Strain: Low Risk  (07/05/2022)   Overall Financial Resource Strain (CARDIA)    Difficulty of Paying Living Expenses: Not hard at all  Food Insecurity: No Food Insecurity (10/14/2022)   Hunger Vital Sign    Worried About Running Out of Food in the Last Year: Never true    Ran Out of Food in the Last Year: Never true  Transportation Needs: No Transportation Needs (10/14/2022)   PRAPARE - Transportation    Lack of Transportation (Medical): No    Lack of Transportation (Non-Medical): No  Physical Activity: Insufficiently Active (07/05/2022)   Exercise Vital Sign    Days of Exercise per Week: 2 days    Minutes of Exercise per Session: 30 min  Stress: No Stress Concern Present (07/05/2022)   [employer] - Occupational Stress Questionnaire    Feeling of Stress : Not at all  Social Connections: Unknown (07/05/2022)   Social Connection and   Isolation Panel [NHANES]    Frequency of Communication with Friends and Family: More than three times a week    Frequency of Social Gatherings with Friends and Family: Three times a week    Attends Religious Services: More than 4 times per year    Active Member of Clubs or Organizations: Not on file    Attends Club or Organization Meetings: Not on file    Marital Status: Not on file   Family History  Problem Relation Age of Onset   Heart disease Mother    Breast cancer Mother 50   Dementia Father    Hypertension Father    Heart  disease Father    Gout Father    Heart disease Brother        MI with CABG X3   Breast cancer Maternal Aunt 80   No Known Allergies Prior to Admission medications   Medication Sig Start Date End Date Taking? Authorizing Provider  Coenzyme Q10 10 MG capsule Take 10 mg by mouth daily.    [provider]  donepezil (ARICEPT) 10 MG tablet TAKE 1 TABLET BY MOUTH EVERYDAY AT BEDTIME 01/05/22   Gilbert, Richard L, MD  levothyroxine (SYNTHROID) 112 MCG tablet Take 1 tablet (112 mcg total) by mouth daily. 09/02/22   Fisher, Donald E, MD  losartan (COZAAR) 100 MG tablet Take 1 tablet (100 mg total) by mouth daily. 09/07/22   Fisher, Donald E, MD  metoprolol succinate (TOPROL-XL) 100 MG 24 hr tablet TAKE 1 TABLET BY MOUTH EVERY DAY WITH OR IMMEDIATELY FOLLOWING A MEAL 11/26/21   Gilbert, Richard L, MD  Multiple Vitamins-Minerals (WOMENS MULTIVITAMIN PO) 1 tablet daily.    [provider]   DG Hip Unilat W or Wo Pelvis 2-3 Views Left  Result Date: 10/14/2022 CLINICAL DATA:  82-year-old female status post fall.  Left hip pain. EXAM: DG HIP (WITH OR WITHOUT PELVIS) 2-3V LEFT COMPARISON:  CT Abdomen and Pelvis 02/03/2010. FINDINGS: Transverse, mildly comminuted left femoral neck fracture. 1/2 shaft width displacement and mild varus impaction on the frog-leg lateral view. The proximal left femur intertrochanteric segment appears to remain intact. Left femoral head remains normally located. No acute pelvis fracture identified. Grossly intact proximal right femur. Negative visible bowel gas. IMPRESSION: Positive for transverse, mildly comminuted Left Femoral Neck Fracture. Mild displacement and varus impaction. Electronically Signed   By: H  Hall M.D.   On: 10/14/2022 05:01   DG Chest 1 View  Result Date: 10/14/2022 CLINICAL DATA:  82-year-old female status post fall. Left femoral neck fracture. EXAM: CHEST  1 VIEW COMPARISON:  No prior chest imaging. CT Abdomen and Pelvis 02/03/2010. FINDINGS: AP  supine view at 0421 hours. Mild cardiomegaly. Other mediastinal contours are within normal limits. Visualized tracheal air column is within normal limits. Normal lung volumes. Allowing for portable technique the lungs are clear. Visible osseous structures appear intact. Paucity of bowel gas in the visible abdomen. IMPRESSION: Mild cardiomegaly. No acute cardiopulmonary abnormality. Electronically Signed   By: H  Hall M.D.   On: 10/14/2022 04:59   CT HEAD WO CONTRAST (5MM)  Result Date: 10/14/2022 CLINICAL DATA:  82-year-old female status post fall with hip pain. EXAM: CT HEAD WITHOUT CONTRAST TECHNIQUE: Contiguous axial images were obtained from the base of the skull through the vertex without intravenous contrast. RADIATION DOSE REDUCTION: This exam was performed according to the departmental dose-optimization program which includes automated exposure control, adjustment of the mA and/or kV according to patient size and/or use of iterative   reconstruction technique. COMPARISON:  Brain MRI 10/17/2019. FINDINGS: Brain: Mild motion artifact at the base of skull. No midline shift, ventriculomegaly, mass effect, evidence of mass lesion, intracranial hemorrhage or evidence of cortically based acute infarction. Mild left unilateral basal ganglia vascular calcifications. Mild additional generalized cerebral volume loss since the 2021 MRI. Gray-white differentiation preserved, within normal limits for age. Vascular: Calcified atherosclerosis at the skull base. No suspicious intracranial vascular hyperdensity. Skull: Mild motion artifact at the skull base. No acute osseous abnormality identified. Sinuses/Orbits: Visualized paranasal sinuses and mastoids are stable and well aerated. Other: No acute orbit or scalp soft tissue injury identified. IMPRESSION: Mild motion artifact at the skull base. No acute intracranial abnormality or acute traumatic injury identified. Electronically Signed   By: H  Hall M.D.   On: 10/14/2022  04:30   Family History Reviewed and non-contributory, no pertinent history of problems with bleeding or anesthesia    Review of Systems No fevers or chills No numbness or tingling No chest pain No shortness of breath No bowel or bladder dysfunction No GI distress No headaches Progressively worsening memory loss   OBJECTIVE  Vitals:Patient Vitals for the past 8 hrs:  BP Temp Temp src Pulse Resp SpO2 Height Weight  10/14/22 0808 (!) 173/65 -- -- 85 18 91 % -- --  10/14/22 0631 (!) 178/69 97.7 F (36.5 C) Oral 79 18 -- 5' 1" (1.549 m) 54.4 kg  10/14/22 0624 (!) 178/69 97.7 F (36.5 C) Oral 79 18 93 % -- --  10/14/22 0545 (!) 148/63 -- -- 85 16 -- -- --  10/14/22 0530 (!) 191/71 -- -- 82 -- 91 % -- --  10/14/22 0350 -- 98.2 F (36.8 C) Oral -- -- -- -- --  Physical Exam Vitals and nursing note reviewed.  Constitutional:      General: She is not in acute distress.    Appearance: Normal appearance. She is not ill-appearing, toxic-appearing or diaphoretic.  HENT:     Head: Normocephalic and atraumatic.     Nose: Nose normal. No congestion or rhinorrhea.  Eyes:     General: No scleral icterus.       Right eye: No discharge.        Left eye: No discharge.     Extraocular Movements: Extraocular movements intact.     Conjunctiva/sclera: Conjunctivae normal.     Pupils: Pupils are equal, round, and reactive to light.  Cardiovascular:     Pulses: Normal pulses.  Pulmonary:     Effort: Pulmonary effort is normal.     Breath sounds: No wheezing.  Musculoskeletal:     Comments: The upper extremities have normal skin except for the bruising and discolorations which appear to be chronic  No tenderness  Normal range of motion  No joint subluxation  Normal muscle tone  The right lower extremity we find similar findings with some chronic skin changes but no tenderness normal range of motion no joint subluxation normal muscle tone  The left lower extremity is slightly  shortened and slightly externally rotated.  It is tender over the greater trochanter.  We deferred range of motion exam because of the fracture the knee and ankle are reduced muscle tone is normal  Skin:    General: Skin is warm and dry.     Capillary Refill: Capillary refill takes less than 2 seconds.     Coloration: Skin is not jaundiced.     Findings: Bruising present. No erythema.  Neurological:     General:   No focal deficit present.     Mental Status: She is alert and oriented to person, place, and time.     Gait: Gait abnormal.  Psychiatric:        Mood and Affect: Mood normal.        Behavior: Behavior normal.        Thought Content: Thought content normal.        Judgment: Judgment normal.        Test Results Imaging I reviewed the imaging and she has a left femoral neck fracture which looks to be nondisplaced on the AP but upon frog lateral the fracture became displaced  This will be discussed with radiology again to emphasized not performing frog laterals on potential hip fractures    Labs cbc Recent Labs    10/14/22 0358  WBC 2.2*  HGB 12.5  HCT 38.9  PLT 77*    Labs inflam No results for input(s): "CRP" in the last 72 hours.  Invalid input(s): "ESR"  Labs coag No results for input(s): "INR", "PTT" in the last 72 hours.  Invalid input(s): "PT"  Recent Labs    10/14/22 0358  NA 138  K 3.6  CL 104  CO2 24  GLUCOSE 110*  BUN 15  CREATININE 0.68  CALCIUM 8.8*      

## 2022-10-14 NOTE — Anesthesia Postprocedure Evaluation (Signed)
Anesthesia Post Note  Patient: Lauren Lawrence  Procedure(s) Performed: ARTHROPLASTY BIPOLAR HIP (HEMIARTHROPLASTY) (Left: Hip)  Patient location during evaluation: PACU Anesthesia Type: Spinal Level of consciousness: awake and alert Pain management: pain level controlled Vital Signs Assessment: post-procedure vital signs reviewed and stable Respiratory status: spontaneous breathing, nonlabored ventilation, respiratory function stable and patient connected to nasal cannula oxygen Cardiovascular status: blood pressure returned to baseline and stable Postop Assessment: no apparent nausea or vomiting Anesthetic complications: no   No notable events documented.   Last Vitals:  Vitals:   10/14/22 1416 10/14/22 1441  BP: (!) 141/48   Pulse:  81  Resp: 10 (!) 21  Temp: (!) 36.3 C   SpO2:  98%    Last Pain:  Vitals:   10/14/22 1416  TempSrc: Oral  PainSc:                  Windell Norfolk

## 2022-10-14 NOTE — Anesthesia Procedure Notes (Addendum)
Procedure Name: Intubation Date/Time: 10/14/2022 3:18 PM  Performed by: Windell Norfolk, MDPre-anesthesia Checklist: Patient identified, Patient being monitored, Timeout performed, Emergency Drugs available and Suction available Patient Re-evaluated:Patient Re-evaluated prior to induction Oxygen Delivery Method: Circle System Utilized Preoxygenation: Pre-oxygenation with 100% oxygen Induction Type: IV induction Ventilation: Mask ventilation without difficulty Laryngoscope Size: Mac and 3 Grade View: Grade I Tube type: Oral Tube size: 7.0 mm Number of attempts: 1 Airway Equipment and Method: Stylet Placement Confirmation: ETT inserted through vocal cords under direct vision, positive ETCO2 and breath sounds checked- equal and bilateral Secured at: 21 cm Tube secured with: Tape Dental Injury: Teeth and Oropharynx as per pre-operative assessment

## 2022-10-14 NOTE — H&P (View-Only) (Signed)
ORTHOPAEDIC CONSULTATION  REQUESTING PHYSICIAN: Shon Hale, MD  ASSESSMENT AND PLAN: 82 y.o. female with the following: Unwitnessed fall at home patient found by her daughter who she lives with.  She does not remember how she fell.  Complained of left hip pain.  She was unable to ambulate.  She is brought to the emergency room for evaluation and management  She was found to have a left femoral neck fracture  She complains of severe pain in the left hip that is nonradiating is over the groin and left greater trochanter it is increased with movement.  This patient requires inpatient admission to manage this problem appropriately. Orthopedics recommends admission to a medical service and we will provide consultation and follow along  Diagnosis left femoral neck fracture  Plan bipolar hip replacement  The procedure has been fully reviewed with the patient; The risks and benefits of surgery have been discussed and explained and understood. Alternative treatment has also been reviewed, questions were encouraged and answered. The postoperative plan is also been reviewed.  Confounding variables include thrombocytopenia and iron deficiency anemia.  I have asked the patient to be crossmatched for blood and platelets    Chief Complaint: Left hip pain  HPI: Lauren Lawrence is a 82 y.o. female see above for history, she has iron deficiency anemia thrombocytopenia this has been worked up by hematology with no obvious cause for the thrombocytopenia she is placed on iron for the iron deficiency.  She does not use a cane or walker  Past Medical History:  Diagnosis Date   Depression    Hyperlipidemia    Hypertension    Memory loss    Past Surgical History:  Procedure Laterality Date   CATARACT EXTRACTION  03/2020   Social History   Socioeconomic History   Marital status: Widowed    Spouse name: Alfredo Bach   Number of children: 2   Years of education: 16   Highest education level:  Bachelor's degree (e.g., BA, AB, BS)  Occupational History   Occupation: retired  Tobacco Use   Smoking status: Never   Smokeless tobacco: Never  Vaping Use   Vaping Use: Never used  Substance and Sexual Activity   Alcohol use: No   Drug use: No   Sexual activity: Never  Other Topics Concern   Not on file  Social History Narrative   08/11/20 Lives at home. One of her daughters, Rosey Bath lives with her.    Right handed   Caffeine: 1 cup/day   Social Determinants of Health   Financial Resource Strain: Low Risk  (07/05/2022)   Overall Financial Resource Strain (CARDIA)    Difficulty of Paying Living Expenses: Not hard at all  Food Insecurity: No Food Insecurity (10/14/2022)   Hunger Vital Sign    Worried About Running Out of Food in the Last Year: Never true    Ran Out of Food in the Last Year: Never true  Transportation Needs: No Transportation Needs (10/14/2022)   PRAPARE - Administrator, Civil Service (Medical): No    Lack of Transportation (Non-Medical): No  Physical Activity: Insufficiently Active (07/05/2022)   Exercise Vital Sign    Days of Exercise per Week: 2 days    Minutes of Exercise per Session: 30 min  Stress: No Stress Concern Present (07/05/2022)   [employer] - Occupational Stress Questionnaire    Feeling of Stress : Not at all  Social Connections: Unknown (07/05/2022)   Social Connection and  Isolation Panel [NHANES]    Frequency of Communication with Friends and Family: More than three times a week    Frequency of Social Gatherings with Friends and Family: Three times a week    Attends Religious Services: More than 4 times per year    Active Member of Clubs or Organizations: Not on file    Attends Banker Meetings: Not on file    Marital Status: Not on file   Family History  Problem Relation Age of Onset   Heart disease Mother    Breast cancer Mother 46   Dementia Father    Hypertension Father    Heart  disease Father    Gout Father    Heart disease Brother        MI with CABG X3   Breast cancer Maternal Aunt 14   No Known Allergies Prior to Admission medications   Medication Sig Start Date End Date Taking? Authorizing Provider  Coenzyme Q10 10 MG capsule Take 10 mg by mouth daily.    [provider]  donepezil (ARICEPT) 10 MG tablet TAKE 1 TABLET BY MOUTH EVERYDAY AT BEDTIME 01/05/22   Bosie Clos, MD  levothyroxine (SYNTHROID) 112 MCG tablet Take 1 tablet (112 mcg total) by mouth daily. 09/02/22   Malva Limes, MD  losartan (COZAAR) 100 MG tablet Take 1 tablet (100 mg total) by mouth daily. 09/07/22   Malva Limes, MD  metoprolol succinate (TOPROL-XL) 100 MG 24 hr tablet TAKE 1 TABLET BY MOUTH EVERY DAY WITH OR IMMEDIATELY FOLLOWING A MEAL 11/26/21   Bosie Clos, MD  Multiple Vitamins-Minerals (WOMENS MULTIVITAMIN PO) 1 tablet daily.    [provider]   DG Hip Unilat W or Wo Pelvis 2-3 Views Left  Result Date: 10/14/2022 CLINICAL DATA:  82 year old female status post fall.  Left hip pain. EXAM: DG HIP (WITH OR WITHOUT PELVIS) 2-3V LEFT COMPARISON:  CT Abdomen and Pelvis 02/03/2010. FINDINGS: Transverse, mildly comminuted left femoral neck fracture. 1/2 shaft width displacement and mild varus impaction on the frog-leg lateral view. The proximal left femur intertrochanteric segment appears to remain intact. Left femoral head remains normally located. No acute pelvis fracture identified. Grossly intact proximal right femur. Negative visible bowel gas. IMPRESSION: Positive for transverse, mildly comminuted Left Femoral Neck Fracture. Mild displacement and varus impaction. Electronically Signed   By: Odessa Fleming M.D.   On: 10/14/2022 05:01   DG Chest 1 View  Result Date: 10/14/2022 CLINICAL DATA:  82 year old female status post fall. Left femoral neck fracture. EXAM: CHEST  1 VIEW COMPARISON:  No prior chest imaging. CT Abdomen and Pelvis 02/03/2010. FINDINGS: AP  supine view at 0421 hours. Mild cardiomegaly. Other mediastinal contours are within normal limits. Visualized tracheal air column is within normal limits. Normal lung volumes. Allowing for portable technique the lungs are clear. Visible osseous structures appear intact. Paucity of bowel gas in the visible abdomen. IMPRESSION: Mild cardiomegaly. No acute cardiopulmonary abnormality. Electronically Signed   By: Odessa Fleming M.D.   On: 10/14/2022 04:59   CT HEAD WO CONTRAST ( )  Result Date: 10/14/2022 CLINICAL DATA:  82 year old female status post fall with hip pain. EXAM: CT HEAD WITHOUT CONTRAST TECHNIQUE: Contiguous axial images were obtained from the base of the skull through the vertex without intravenous contrast. RADIATION DOSE REDUCTION: This exam was performed according to the departmental dose-optimization program which includes automated exposure control, adjustment of the mA and/or kV according to patient size and/or use of iterative  reconstruction technique. COMPARISON:  Brain MRI 10/17/2019. FINDINGS: Brain: Mild motion artifact at the base of skull. No midline shift, ventriculomegaly, mass effect, evidence of mass lesion, intracranial hemorrhage or evidence of cortically based acute infarction. Mild left unilateral basal ganglia vascular calcifications. Mild additional generalized cerebral volume loss since the 2021 MRI. Gray-white differentiation preserved, within normal limits for age. Vascular: Calcified atherosclerosis at the skull base. No suspicious intracranial vascular hyperdensity. Skull: Mild motion artifact at the skull base. No acute osseous abnormality identified. Sinuses/Orbits: Visualized paranasal sinuses and mastoids are stable and well aerated. Other: No acute orbit or scalp soft tissue injury identified. IMPRESSION: Mild motion artifact at the skull base. No acute intracranial abnormality or acute traumatic injury identified. Electronically Signed   By: Odessa Fleming M.D.   On: 10/14/2022  04:30   Family History Reviewed and non-contributory, no pertinent history of problems with bleeding or anesthesia    Review of Systems No fevers or chills No numbness or tingling No chest pain No shortness of breath No bowel or bladder dysfunction No GI distress No headaches Progressively worsening memory loss   OBJECTIVE  Vitals:Patient Vitals for the past 8 hrs:  BP Temp Temp src Pulse Resp SpO2 Height Weight  10/14/22 0808 (!) 173/65 -- -- 85 18 91 % -- --  10/14/22 0631 (!) 178/69 97.7 F (36.5 C) Oral 79 18 -- 5\' 1"  (1.549 m) 54.4 kg  10/14/22 0624 (!) 178/69 97.7 F (36.5 C) Oral 79 18 93 % -- --  10/14/22 0545 (!) 148/63 -- -- 85 16 -- -- --  10/14/22 0530 (!) 191/71 -- -- 82 -- 91 % -- --  10/14/22 0350 -- 98.2 F (36.8 C) Oral -- -- -- -- --  Physical Exam Vitals and nursing note reviewed.  Constitutional:      General: She is not in acute distress.    Appearance: Normal appearance. She is not ill-appearing, toxic-appearing or diaphoretic.  HENT:     Head: Normocephalic and atraumatic.     Nose: Nose normal. No congestion or rhinorrhea.  Eyes:     General: No scleral icterus.       Right eye: No discharge.        Left eye: No discharge.     Extraocular Movements: Extraocular movements intact.     Conjunctiva/sclera: Conjunctivae normal.     Pupils: Pupils are equal, round, and reactive to light.  Cardiovascular:     Pulses: Normal pulses.  Pulmonary:     Effort: Pulmonary effort is normal.     Breath sounds: No wheezing.  Musculoskeletal:     Comments: The upper extremities have normal skin except for the bruising and discolorations which appear to be chronic  No tenderness  Normal range of motion  No joint subluxation  Normal muscle tone  The right lower extremity we find similar findings with some chronic skin changes but no tenderness normal range of motion no joint subluxation normal muscle tone  The left lower extremity is slightly  shortened and slightly externally rotated.  It is tender over the greater trochanter.  We deferred range of motion exam because of the fracture the knee and ankle are reduced muscle tone is normal  Skin:    General: Skin is warm and dry.     Capillary Refill: Capillary refill takes less than 2 seconds.     Coloration: Skin is not jaundiced.     Findings: Bruising present. No erythema.  Neurological:     General:  No focal deficit present.     Mental Status: She is alert and oriented to person, place, and time.     Gait: Gait abnormal.  Psychiatric:        Mood and Affect: Mood normal.        Behavior: Behavior normal.        Thought Content: Thought content normal.        Judgment: Judgment normal.        Test Results Imaging I reviewed the imaging and she has a left femoral neck fracture which looks to be nondisplaced on the AP but upon frog lateral the fracture became displaced  This will be discussed with radiology again to emphasized not performing frog laterals on potential hip fractures    Labs cbc Recent Labs    10/14/22 0358  WBC 2.2*  HGB 12.5  HCT 38.9  PLT 77*    Labs inflam No results for input(s): "CRP" in the last 72 hours.  Invalid input(s): "ESR"  Labs coag No results for input(s): "INR", "PTT" in the last 72 hours.  Invalid input(s): "PT"  Recent Labs    10/14/22 0358  NA 138  K 3.6  CL 104  CO2 24  GLUCOSE 110*  BUN 15  CREATININE 0.68  CALCIUM 8.8*

## 2022-10-14 NOTE — ED Provider Notes (Signed)
[place] EMERGENCY DEPARTMENT AT [facility] Provider Note   CSN: [id number] Arrival date & time: 10/14/22  9629     History  Chief Complaint  Patient presents with   Fall    Lauren Lawrence is a 82 y.o. female.  Patient presents to the emergency department after a fall.  Patient not sure exactly what happened.  Daughter heard a sound and checked on her, found her on the ground.  They think she was getting up to go to the bathroom.  Patient complaining of left hip pain.       Home Medications Prior to Admission medications   Medication Sig Start Date End Date Taking? Authorizing Provider  Coenzyme Q10 10 MG capsule Take 10 mg by mouth daily.    [provider]  donepezil (ARICEPT) 10 MG tablet TAKE 1 TABLET BY MOUTH EVERYDAY AT BEDTIME 01/05/22   Bosie Clos, MD  levothyroxine (SYNTHROID) 112 MCG tablet Take 1 tablet (112 mcg total) by mouth daily. 09/02/22   Malva Limes, MD  losartan (COZAAR) 100 MG tablet Take 1 tablet (100 mg total) by mouth daily. 09/07/22   Malva Limes, MD  metoprolol succinate (TOPROL-XL) 100 MG 24 hr tablet TAKE 1 TABLET BY MOUTH EVERY DAY WITH OR IMMEDIATELY FOLLOWING A MEAL 11/26/21   Bosie Clos, MD  Multiple Vitamins-Minerals (WOMENS MULTIVITAMIN PO) 1 tablet daily.    [provider]      Allergies    Patient has no known allergies.    Review of Systems   Review of Systems  Physical Exam Updated Vital Signs Temp 98.2 F (36.8 C) (Oral)  Physical Exam Vitals and nursing note reviewed.  Constitutional:      General: She is not in acute distress.    Appearance: She is well-developed.  HENT:     Head: Normocephalic and atraumatic.     Mouth/Throat:     Mouth: Mucous membranes are moist.  Eyes:     General: Vision grossly intact. Gaze aligned appropriately.     Extraocular Movements: Extraocular movements intact.     Conjunctiva/sclera: Conjunctivae normal.  Cardiovascular:     Rate and  Rhythm: Normal rate and regular rhythm.     Pulses: Normal pulses.     Heart sounds: Normal heart sounds, S1 normal and S2 normal. No murmur heard.    No friction rub. No gallop.  Pulmonary:     Effort: Pulmonary effort is normal. No respiratory distress.     Breath sounds: Normal breath sounds.  Abdominal:     General: Bowel sounds are normal.     Palpations: Abdomen is soft.     Tenderness: There is no abdominal tenderness. There is no guarding or rebound.     Hernia: No hernia is present.  Musculoskeletal:        General: No swelling.     Cervical back: Full passive range of motion without pain, normal range of motion and neck supple. No spinous process tenderness or muscular tenderness. Normal range of motion.     Left hip: Tenderness present. No deformity. Decreased range of motion.     Right lower leg: No edema.     Left lower leg: No edema.  Skin:    General: Skin is warm and dry.     Capillary Refill: Capillary refill takes less than 2 seconds.     Findings: No ecchymosis, erythema, rash or wound.  Neurological:     General: No focal deficit  present.     Mental Status: She is alert and oriented to person, place, and time.     GCS: GCS eye subscore is 4. GCS verbal subscore is 5. GCS motor subscore is 6.     Cranial Nerves: Cranial nerves 2-12 are intact.     Sensory: Sensation is intact.     Motor: Motor function is intact.     Coordination: Coordination is intact.  Psychiatric:        Attention and Perception: Attention normal.        Mood and Affect: Mood normal.        Speech: Speech normal.        Behavior: Behavior normal.     ED Results / Procedures / Treatments   Labs (all labs ordered are listed, but only abnormal results are displayed) Labs Reviewed  CBC WITH DIFFERENTIAL/PLATELET  BASIC METABOLIC PANEL  URINALYSIS, ROUTINE W REFLEX MICROSCOPIC    EKG EKG Interpretation  Date/Time:  Thursday October 14 2022 03:46:33 EDT Ventricular Rate:  73 PR  Interval:  169 QRS Duration: 104 QT Interval:  426 QTC Calculation: 470 R Axis:   37 Text Interpretation: Sinus rhythm Probable left atrial enlargement Probable anteroseptal infarct, old Confirmed by Gilda Crease [phone]) on 10/14/2022 3:56:45 AM  Radiology CT HEAD WO CONTRAST ( )  Result Date: 10/14/2022 CLINICAL DATA:  82 year old female status post fall with hip pain. EXAM: CT HEAD WITHOUT CONTRAST TECHNIQUE: Contiguous axial images were obtained from the base of the skull through the vertex without intravenous contrast. RADIATION DOSE REDUCTION: This exam was performed according to the departmental dose-optimization program which includes automated exposure control, adjustment of the mA and/or kV according to patient size and/or use of iterative reconstruction technique. COMPARISON:  Brain MRI 10/17/2019. FINDINGS: Brain: Mild motion artifact at the base of skull. No midline shift, ventriculomegaly, mass effect, evidence of mass lesion, intracranial hemorrhage or evidence of cortically based acute infarction. Mild left unilateral basal ganglia vascular calcifications. Mild additional generalized cerebral volume loss since the 2021 MRI. Gray-white differentiation preserved, within normal limits for age. Vascular: Calcified atherosclerosis at the skull base. No suspicious intracranial vascular hyperdensity. Skull: Mild motion artifact at the skull base. No acute osseous abnormality identified. Sinuses/Orbits: Visualized paranasal sinuses and mastoids are stable and well aerated. Other: No acute orbit or scalp soft tissue injury identified. IMPRESSION: Mild motion artifact at the skull base. No acute intracranial abnormality or acute traumatic injury identified. Electronically Signed   By: Odessa Fleming M.D.   On: 10/14/2022 04:30    Procedures Procedures    Medications Ordered in ED Medications  ondansetron (ZOFRAN) injection 4 mg (4 mg Intravenous Given 10/14/22 0409)  morphine (PF) 4 MG/ML  injection 4 mg (4 mg Intravenous Given 10/14/22 0409)    ED Course/ Medical Decision Making/ A&P                             Medical Decision Making Amount and/or Complexity of Data Reviewed External Data Reviewed: labs and notes. Labs: ordered. Decision-making details documented in ED Course. Radiology: ordered and independent interpretation performed. Decision-making details documented in ED Course.  Risk Prescription drug management.   Differential diagnosis considered includes, but not limited to: Pelvic fracture; hip fracture; contusion  Patient presents with left hip pain after a fall.  She thinks she was getting up out of bed to go to the bathroom when she fell.  Examination did not reveal  any deformity but she has severe pain with movement of the left hip.  She is not on blood thinners.  No obvious head injury but she does have a history of dementia and cannot remember what happened.  CT head was performed, no acute injury or abnormality noted.  She did not have any neck pain or stiffness, cervical spine cleared by Nexus criteria.  X-ray of left hip reveals fracture.  She will be admitted to hospitalist service.        Final Clinical Impression(s) / ED Diagnoses Final diagnoses:  Closed fracture of left hip, initial encounter [facility])    Rx / DC Orders ED Discharge Orders     None         Lyn Joens, Canary Brim, MD 10/14/22 [phone]

## 2022-10-14 NOTE — NC FL2 (Signed)
MEDICAID FL2 LEVEL OF CARE FORM     IDENTIFICATION  Patient Name: Lauren Lawrence Birthdate: Apr 02, 1941 Sex: female Admission Date (Current Location): 10/14/2022  [facility] and [place] Number:  [employer] and Address:  [facility],  [address] [place] [address]      Provider Number: [id number]  Attending Physician Name and Address:  Shon Hale, MD  Relative Name and Phone Number:  Merlyn Lot (Daughter) [phone]    Current Level of Care: Hospital Recommended Level of Care: Skilled Nursing Facility Prior Approval Number:    Date Approved/Denied:   PASRR Number: [id number]  Discharge Plan: SNF    Current Diagnoses: Patient Active Problem List   Diagnosis Date Noted   Displaced fracture of left femoral neck (HCC) 10/14/2022   Hypertensive urgency 10/14/2022   Dementia without behavioral disturbance (HCC) 10/14/2022   Iron deficiency anemia 05/14/2022   Maculopapular rash, localized 07/03/2021   Memory loss 05/11/2021   Thrombocytopenia (HCC) 07/10/2019   Skin trauma 07/10/2019   Allergic rhinitis 10/17/2014   Cyst of thyroid 10/17/2014   Clinical depression 10/17/2014   Essential (primary) hypertension 10/17/2014   Fatty infiltration of liver 10/17/2014   Acquired hypothyroidism 10/17/2014   Leukopenia 10/17/2014   OP (osteoporosis) 10/17/2014   Enlargement of spleen 10/17/2014   Phlebectasia 10/17/2014   Avitaminosis D 10/17/2014   HLD (hyperlipidemia) 07/25/2014    Orientation RESPIRATION BLADDER Height & Weight     Self, Place, Situation  Normal Continent Weight: 54.4 kg Height:  5\' 1"  (154.9 cm)  BEHAVIORAL SYMPTOMS/MOOD NEUROLOGICAL BOWEL NUTRITION STATUS      Continent Diet (See DC summary)  AMBULATORY STATUS COMMUNICATION OF NEEDS Skin   Extensive Assist Verbally Bruising, Surgical wounds                       Personal Care Assistance Level of Assistance  Bathing, Feeding, Dressing Bathing  Assistance: Maximum assistance Feeding assistance: Limited assistance Dressing Assistance: Maximum assistance     Functional Limitations Info  Sight, Hearing, Speech Sight Info: Impaired Hearing Info: Adequate      SPECIAL CARE FACTORS FREQUENCY  PT (By licensed PT)     PT Frequency: 5 Times a week              Contractures Contractures Info: Not present    Additional Factors Info  Code Status, Allergies Code Status Info: FULL Allergies Info: NKDA           Current Medications (10/14/2022):  This is the current hospital active medication list Current Facility-Administered Medications  Medication Dose Route Frequency Provider Last Rate Last Admin   acetaminophen (TYLENOL) tablet 650 mg  650 mg Oral Q6H PRN Adefeso, Oladapo, DO       Or   acetaminophen (TYLENOL) suppository 650 mg  650 mg Rectal Q6H PRN Adefeso, Oladapo, DO       donepezil (ARICEPT) tablet 10 mg  10 mg Oral QHS Adefeso, Oladapo, DO       hydrALAZINE (APRESOLINE) injection 10 mg  10 mg Intravenous Q6H PRN Adefeso, Oladapo, DO   10 mg at 10/14/22 0829   [START ON 10/15/2022] levothyroxine (SYNTHROID) tablet 112 mcg  112 mcg Oral Daily Adefeso, Oladapo, DO       losartan (COZAAR) tablet 100 mg  100 mg Oral Daily Adefeso, Oladapo, DO   100 mg at 10/14/22 0950   metoprolol succinate (TOPROL-XL) 24 hr tablet 100 mg  100 mg Oral Daily Adefeso, Oladapo,  DO   100 mg at 10/14/22 0950   morphine (PF) 2 MG/ML injection 2 mg  2 mg Intravenous Q3H PRN Adefeso, Oladapo, DO   2 mg at 10/14/22 0829   ondansetron (ZOFRAN) tablet 4 mg  4 mg Oral Q6H PRN Adefeso, Oladapo, DO       Or   ondansetron (ZOFRAN) injection 4 mg  4 mg Intravenous Q6H PRN Adefeso, Oladapo, DO         Discharge Medications: Please see discharge summary for a list of discharge medications.  Relevant Imaging Results:  Relevant Lab Results:   Additional Information SS# [id number]  Leitha Bleak, RN

## 2022-10-14 NOTE — ED Notes (Signed)
ED TO INPATIENT HANDOFF REPORT  ED Nurse Name and Phone #:   S Name/Age/Gender Lauren Lawrence 82 y.o. female Room/Bed: APA04/APA04  Code Status   Code Status: Not on file  Home/SNF/Other Home Patient oriented to: self, place, time, and situation Is this baseline? Yes   Triage Complete: Triage complete  Chief Complaint Displaced fracture of left femoral neck (HCC) [S72.002A]  Triage Note Pt presents with fall from home and now complaining of left hip pain. No blood thinners per ems. Pt hx of dementia. No shortening or rotation.    Allergies No Known Allergies  Level of Care/Admitting Diagnosis ED Disposition     ED Disposition  Admit   Condition  --   Comment  Hospital Area: [facility] [[id number]]  Level of Care: Med-Surg [16]  Covid Evaluation: Asymptomatic - no recent exposure (last 10 days) testing not required  Diagnosis: Displaced fracture of left femoral neck [facility]) [[id number]]  Admitting Physician: Frankey Shown [[id number]]  Attending Physician: Frankey Shown [[id number]]  Certification:: I certify this patient will need inpatient services for at least 2 midnights  Estimated Length of Stay: 3          B Medical/Surgery History Past Medical History:  Diagnosis Date   Depression    Hyperlipidemia    Hypertension    Memory loss    Past Surgical History:  Procedure Laterality Date   CATARACT EXTRACTION  03/2020     A IV Location/Drains/Wounds Patient Lines/Drains/Airways Status     Active Line/Drains/Airways     Name Placement date Placement time Site Days   Peripheral IV 10/14/22 20 G 1" Right Antecubital 10/14/22  0359  Antecubital  less than 1   External Urinary Catheter 10/14/22  0350  --  less than 1            Intake/Output Last 24 hours No intake or output data in the 24 hours ending 10/14/22 0553  Labs/Imaging Results for orders placed or performed during the hospital encounter of 10/14/22 (from the past 48 hour(s))  CBC  with Differential/Platelet     Status: Abnormal   Collection Time: 10/14/22  3:58 AM  Result Value Ref Range   WBC 2.2 (L) 4.0 - 10.5 K/uL   RBC 4.38 3.87 - 5.11 MIL/uL   Hemoglobin 12.5 12.0 - 15.0 g/dL   HCT 84.1 32.4 - 40.1 %   MCV 88.8 80.0 - 100.0 fL   MCH 28.5 26.0 - 34.0 pg   MCHC 32.1 30.0 - 36.0 g/dL   RDW 02.7 (H) 25.3 - 66.4 %   Platelets 77 (L) 150 - 400 K/uL    Comment: SPECIMEN CHECKED FOR CLOTS Immature Platelet Fraction may be clinically indicated, consider ordering this additional test [id number] REPEATED TO VERIFY PLATELET COUNT CONFIRMED BY SMEAR    nRBC 0.0 0.0 - 0.2 %   Neutrophils Relative % 58 %   Neutro Abs 1.3 (L) 1.7 - 7.7 K/uL   Lymphocytes Relative 23 %   Lymphs Abs 0.5 (L) 0.7 - 4.0 K/uL   Monocytes Relative 11 %   Monocytes Absolute 0.2 0.1 - 1.0 K/uL   Eosinophils Relative 6 %   Eosinophils Absolute 0.1 0.0 - 0.5 K/uL   Basophils Relative 1 %   Basophils Absolute 0.0 0.0 - 0.1 K/uL   WBC Morphology MORPHOLOGY UNREMARKABLE    RBC Morphology See Note     Comment: ANISOCYTOSIS PRESENT   Immature Granulocytes 1 %   Abs Immature Granulocytes 0.02 0.00 -  0.07 K/uL    Comment: Performed at [facility], [address], [place], [place] [address]  Basic metabolic panel     Status: Abnormal   Collection Time: 10/14/22  3:58 AM  Result Value Ref Range   Sodium 138 135 - 145 mmol/L   Potassium 3.6 3.5 - 5.1 mmol/L   Chloride 104 98 - 111 mmol/L   CO2 24 22 - 32 mmol/L   Glucose, Bld 110 (H) 70 - 99 mg/dL    Comment: Glucose reference range applies only to samples taken after fasting for at least 8 hours.   BUN 15 8 - 23 mg/dL   Creatinine, Ser 0.98 0.44 - 1.00 mg/dL   Calcium 8.8 (L) 8.9 - 10.3 mg/dL   GFR, Estimated >11 >91 mL/min    Comment: (NOTE) Calculated using the CKD-EPI Creatinine Equation (2021)    Anion gap 10 5 - 15    Comment: Performed at [facility], [address] [place], [place] [address]   DG Hip Lucienne Capers or Wo Pelvis 2-3  Views Left  Result Date: 10/14/2022 CLINICAL DATA:  82 year old female status post fall.  Left hip pain. EXAM: DG HIP (WITH OR WITHOUT PELVIS) 2-3V LEFT COMPARISON:  CT Abdomen and Pelvis 02/03/2010. FINDINGS: Transverse, mildly comminuted left femoral neck fracture. 1/2 shaft width displacement and mild varus impaction on the frog-leg lateral view. The proximal left femur intertrochanteric segment appears to remain intact. Left femoral head remains normally located. No acute pelvis fracture identified. Grossly intact proximal right femur. Negative visible bowel gas. IMPRESSION: Positive for transverse, mildly comminuted Left Femoral Neck Fracture. Mild displacement and varus impaction. Electronically Signed   By: Odessa Fleming M.D.   On: 10/14/2022 05:01   DG Chest 1 View  Result Date: 10/14/2022 CLINICAL DATA:  82 year old female status post fall. Left femoral neck fracture. EXAM: CHEST  1 VIEW COMPARISON:  No prior chest imaging. CT Abdomen and Pelvis 02/03/2010. FINDINGS: AP supine view at 0421 hours. Mild cardiomegaly. Other mediastinal contours are within normal limits. Visualized tracheal air column is within normal limits. Normal lung volumes. Allowing for portable technique the lungs are clear. Visible osseous structures appear intact. Paucity of bowel gas in the visible abdomen. IMPRESSION: Mild cardiomegaly. No acute cardiopulmonary abnormality. Electronically Signed   By: Odessa Fleming M.D.   On: 10/14/2022 04:59   CT HEAD WO CONTRAST ( )  Result Date: 10/14/2022 CLINICAL DATA:  82 year old female status post fall with hip pain. EXAM: CT HEAD WITHOUT CONTRAST TECHNIQUE: Contiguous axial images were obtained from the base of the skull through the vertex without intravenous contrast. RADIATION DOSE REDUCTION: This exam was performed according to the departmental dose-optimization program which includes automated exposure control, adjustment of the mA and/or kV according to patient size and/or use of  iterative reconstruction technique. COMPARISON:  Brain MRI 10/17/2019. FINDINGS: Brain: Mild motion artifact at the base of skull. No midline shift, ventriculomegaly, mass effect, evidence of mass lesion, intracranial hemorrhage or evidence of cortically based acute infarction. Mild left unilateral basal ganglia vascular calcifications. Mild additional generalized cerebral volume loss since the 2021 MRI. Gray-white differentiation preserved, within normal limits for age. Vascular: Calcified atherosclerosis at the skull base. No suspicious intracranial vascular hyperdensity. Skull: Mild motion artifact at the skull base. No acute osseous abnormality identified. Sinuses/Orbits: Visualized paranasal sinuses and mastoids are stable and well aerated. Other: No acute orbit or scalp soft tissue injury identified. IMPRESSION: Mild motion artifact at the skull base. No acute intracranial abnormality or  acute traumatic injury identified. Electronically Signed   By: Odessa Fleming M.D.   On: 10/14/2022 04:30    Pending Labs Unresulted Labs (From admission, onward)     Start     Ordered   10/14/22 0358  Urinalysis, Routine w reflex microscopic -Urine, Clean Catch  ONCE - URGENT,   URGENT       Question:  Specimen Source  Answer:  Urine, Clean Catch   10/14/22 0358            Vitals/Pain Today's Vitals   10/14/22 0326 10/14/22 0350 10/14/22 0530 10/14/22 0545  BP:   (!) 191/71 (!) 148/63  Pulse:   82 85  Temp:  98.2 F (36.8 C)    TempSrc:  Oral    SpO2:   91%   PainSc: 10-Worst pain ever       Isolation Precautions No active isolations  Medications Medications  ondansetron (ZOFRAN) injection 4 mg (4 mg Intravenous Given 10/14/22 0409)  morphine (PF) 4 MG/ML injection 4 mg (4 mg Intravenous Given 10/14/22 0409)  labetalol (NORMODYNE) injection 10 mg (10 mg Intravenous Given 10/14/22 0535)    Mobility non-ambulatory     Focused Assessments    R Recommendations: See Admitting Provider  Note  Report given to:   Additional Notes:

## 2022-10-14 NOTE — Interval H&P Note (Signed)
History and Physical Interval Note:  10/14/2022 2:43 PM  Lauren Lawrence  has presented today for surgery, with the diagnosis of left hip fracture.  The various methods of treatment have been discussed with the patient and family. After consideration of risks, benefits and other options for treatment, the patient has consented to  Procedure(s): ARTHROPLASTY BIPOLAR HIP (HEMIARTHROPLASTY) (Left) as a surgical intervention.  The patient's history has been reviewed, patient examined, no change in status, stable for surgery.  I have reviewed the patient's chart and labs.  Questions were answered to the patient's satisfaction.     Nguyet Mercer   

## 2022-10-14 NOTE — H&P (Signed)
History and Physical    Patient: Lauren Lawrence ZOX:[id number] DOB: 1940-06-27 DOA: 10/14/2022 DOS: the patient was seen and examined on 10/14/2022 PCP: Malva Limes, MD  Patient coming from: Home  Chief Complaint:  Chief Complaint  Patient presents with   Fall   HPI: Lauren Lawrence is a 82 y.o. female with medical history significant of hypertension, hypothyroidism, depression, dementia who presents to the emergency department via EMS from home due to a fall at home.  Patient was not sure how she fell (possibly due to history of dementia), history was obtained from ED physician and ED medical record.  Per report, patient lives with daughter, daughter heard a sound and checked on patient and she was found on the ground moaning with pain, it was thought that she fell when she got up to go to the bathroom.  EMS was activated and patient was taken to the ED for further evaluation and management.  ED Course:  In the emergency department, he was elevated at 208/78, respiratory rate 21 rate per minute and other vital signs were within normal range.  Workup in the ED showed normal CBC except for WBC of 2.2 and platelets 77.  BMP was normal except for blood glucose of 110. Left hip x-ray was positive for transverse, mildly comminuted left femoral neck fracture.  Mild displacement and varus impaction. Chest x-ray showed no acute cardiopulmonary abnormality CT head without contrast showed no acute intracranial abnormality or acute traumatic injury She was treated with morphine, Zofran and IV labetalol.  Orthopedic surgeon (Dr. Romeo Apple) was consulted and recommended admitting patient with plan to consult on patient this morning. Hospitalist was asked to admit patient for further evaluation and management.   Review of Systems: Review of systems as noted in the HPI. All other systems reviewed and are negative.   Past Medical History:  Diagnosis Date   Depression    Hyperlipidemia    Hypertension     Memory loss    Past Surgical History:  Procedure Laterality Date   CATARACT EXTRACTION  03/2020    Social History:  reports that she has never smoked. She has never used smokeless tobacco. She reports that she does not drink alcohol and does not use drugs.   No Known Allergies  Family History  Problem Relation Age of Onset   Heart disease Mother    Breast cancer Mother 30   Dementia Father    Hypertension Father    Heart disease Father    Gout Father    Heart disease Brother        MI with CABG X3   Breast cancer Maternal Aunt 8     Prior to Admission medications   Medication Sig Start Date End Date Taking? Authorizing Provider  Coenzyme Q10 10 MG capsule Take 10 mg by mouth daily.    [provider]  donepezil (ARICEPT) 10 MG tablet TAKE 1 TABLET BY MOUTH EVERYDAY AT BEDTIME 01/05/22   Bosie Clos, MD  levothyroxine (SYNTHROID) 112 MCG tablet Take 1 tablet (112 mcg total) by mouth daily. 09/02/22   Malva Limes, MD  losartan (COZAAR) 100 MG tablet Take 1 tablet (100 mg total) by mouth daily. 09/07/22   Malva Limes, MD  metoprolol succinate (TOPROL-XL) 100 MG 24 hr tablet TAKE 1 TABLET BY MOUTH EVERY DAY WITH OR IMMEDIATELY FOLLOWING A MEAL 11/26/21   Bosie Clos, MD  Multiple Vitamins-Minerals (WOMENS MULTIVITAMIN PO) 1 tablet daily.    [provider]    Physical Exam: BP (!) 178/69   Pulse 79   Temp 97.7 F (36.5 C) (Oral)   Resp 18   Ht 5\' 1"  (1.549 m)   Wt 54.4 kg   SpO2 93%   BMI 22.66 kg/m   General: 82 y.o. year-old female well developed well nourished in no acute distress.  Alert and oriented x3. HEENT: NCAT, EOMI Neck: Supple, trachea medial Cardiovascular: Regular rate and rhythm with no rubs or gallops.  No thyromegaly or JVD noted.  No lower extremity edema. 2/4 pulses in all 4 extremities. Respiratory: Clear to auscultation with no wheezes or rales. Good inspiratory effort. Abdomen: Soft, nontender nondistended  with normal bowel sounds x4 quadrants. Muskuloskeletal: TTP of left hip, decreased ROM of LLE due to left hip pain.  No cyanosis, clubbing or edema noted bilaterally Neuro: CN II-XII intact, sensation, reflexes intact Skin: No ulcerative lesions noted or rashes Psychiatry: Mood is appropriate for condition and setting          Labs on Admission:  Basic Metabolic Panel: Recent Labs  Lab 10/14/22 0358  NA 138  K 3.6  CL 104  CO2 24  GLUCOSE 110*  BUN 15  CREATININE 0.68  CALCIUM 8.8*  MG 1.9  PHOS 3.1   Liver Function Tests: No results for input(s): "AST", "ALT", "ALKPHOS", "BILITOT", "PROT", "ALBUMIN" in the last 168 hours. No results for input(s): "LIPASE", "AMYLASE" in the last 168 hours. No results for input(s): "AMMONIA" in the last 168 hours. CBC: Recent Labs  Lab 10/14/22 0358  WBC 2.2*  NEUTROABS 1.3*  HGB 12.5  HCT 38.9  MCV 88.8  PLT 77*   Cardiac Enzymes: No results for input(s): "CKTOTAL", "CKMB", "CKMBINDEX", "TROPONINI" in the last 168 hours.  BNP (last 3 results) No results for input(s): "BNP" in the last 8760 hours.  ProBNP (last 3 results) No results for input(s): "PROBNP" in the last 8760 hours.  CBG: No results for input(s): "GLUCAP" in the last 168 hours.  Radiological Exams on Admission: DG Hip Unilat W or Wo Pelvis 2-3 Views Left  Result Date: 10/14/2022 CLINICAL DATA:  82 year old female status post fall.  Left hip pain. EXAM: DG HIP (WITH OR WITHOUT PELVIS) 2-3V LEFT COMPARISON:  CT Abdomen and Pelvis 02/03/2010. FINDINGS: Transverse, mildly comminuted left femoral neck fracture. 1/2 shaft width displacement and mild varus impaction on the frog-leg lateral view. The proximal left femur intertrochanteric segment appears to remain intact. Left femoral head remains normally located. No acute pelvis fracture identified. Grossly intact proximal right femur. Negative visible bowel gas. IMPRESSION: Positive for transverse, mildly comminuted Left  Femoral Neck Fracture. Mild displacement and varus impaction. Electronically Signed   By: Odessa Fleming M.D.   On: 10/14/2022 05:01   DG Chest 1 View  Result Date: 10/14/2022 CLINICAL DATA:  82 year old female status post fall. Left femoral neck fracture. EXAM: CHEST  1 VIEW COMPARISON:  No prior chest imaging. CT Abdomen and Pelvis 02/03/2010. FINDINGS: AP supine view at 0421 hours. Mild cardiomegaly. Other mediastinal contours are within normal limits. Visualized tracheal air column is within normal limits. Normal lung volumes. Allowing for portable technique the lungs are clear. Visible osseous structures appear intact. Paucity of bowel gas in the visible abdomen. IMPRESSION: Mild cardiomegaly. No acute cardiopulmonary abnormality. Electronically Signed   By: Odessa Fleming M.D.   On: 10/14/2022 04:59   CT HEAD WO CONTRAST ( )  Result Date: 10/14/2022 CLINICAL DATA:  82 year old female status post fall with  hip pain. EXAM: CT HEAD WITHOUT CONTRAST TECHNIQUE: Contiguous axial images were obtained from the base of the skull through the vertex without intravenous contrast. RADIATION DOSE REDUCTION: This exam was performed according to the departmental dose-optimization program which includes automated exposure control, adjustment of the mA and/or kV according to patient size and/or use of iterative reconstruction technique. COMPARISON:  Brain MRI 10/17/2019. FINDINGS: Brain: Mild motion artifact at the base of skull. No midline shift, ventriculomegaly, mass effect, evidence of mass lesion, intracranial hemorrhage or evidence of cortically based acute infarction. Mild left unilateral basal ganglia vascular calcifications. Mild additional generalized cerebral volume loss since the 2021 MRI. Gray-white differentiation preserved, within normal limits for age. Vascular: Calcified atherosclerosis at the skull base. No suspicious intracranial vascular hyperdensity. Skull: Mild motion artifact at the skull base. No acute  osseous abnormality identified. Sinuses/Orbits: Visualized paranasal sinuses and mastoids are stable and well aerated. Other: No acute orbit or scalp soft tissue injury identified. IMPRESSION: Mild motion artifact at the skull base. No acute intracranial abnormality or acute traumatic injury identified. Electronically Signed   By: Odessa Fleming M.D.   On: 10/14/2022 04:30    EKG: I independently viewed the EKG done and my findings are as followed: Normal sinus rhythm at a rate of 73 bpm  Assessment/Plan Present on Admission:  Displaced fracture of left femoral neck (HCC)  Thrombocytopenia (HCC)  Leukopenia  Essential (primary) hypertension  Acquired hypothyroidism  Principal Problem:   Displaced fracture of left femoral neck (HCC) Active Problems:   Essential (primary) hypertension   Acquired hypothyroidism   Leukopenia   Thrombocytopenia (HCC)   Hypertensive urgency   Dementia without behavioral disturbance (HCC)  Displaced fracture of left femoral neck Continue IV morphine 2 mg every 3 hours as needed Continue fall precaution Continue NPO in anticipation for possible surgical intervention in the morning Orthopedic surgeon already consulted by EDP, shall await further recommendation  Hypertensive urgency -resolved Essential hypertension (uncontrolled) Continue losartan and Toprol-XL when patient resumes oral intake Continue IV Hydralazine 10mg  q6h prn  Chronic Leukopenia WBC 2.2. Continue to monitor WBC in the morning  Thrombocytopenia Platelets 77, continue to monitor platelet level with morning lab  Acquired hypothyroidism Continue Synthroid when patient resumes oral intake  Dementia Continue Donepezil when patient resumes oral intake  DVT prophylaxis: SCDs   Advance Care Planning: Full Code  Consults: Orthopedic Surgery  Family Communication: Daughter at bedside (all questions answered to satisfaction)  Severity of Illness: The appropriate patient status for this  patient is INPATIENT. Inpatient status is judged to be reasonable and necessary in order to provide the required intensity of service to ensure the patient's safety. The patient's presenting symptoms, physical exam findings, and initial radiographic and laboratory data in the context of their chronic comorbidities is felt to place them at high risk for further clinical deterioration. Furthermore, it is not anticipated that the patient will be medically stable for discharge from the hospital within 2 midnights of admission.   * I certify that at the point of admission it is my clinical judgment that the patient will require inpatient hospital care spanning beyond 2 midnights from the point of admission due to high intensity of service, high risk for further deterioration and high frequency of surveillance required.*  Author: Frankey Shown, DO 10/14/2022 7:20 AM  For on call review [email or web address].

## 2022-10-14 NOTE — Anesthesia Preprocedure Evaluation (Signed)
Anesthesia Evaluation  Patient identified by MRN, date of birth, ID band Patient awake    Reviewed: Allergy & Precautions, H&P , NPO status , Patient's Chart, lab work & pertinent test results, reviewed documented beta blocker date and time   Airway Mallampati: II  TM Distance: >3 FB Neck ROM: full    Dental no notable dental hx.    Pulmonary neg pulmonary ROS   Pulmonary exam normal breath sounds clear to auscultation       Cardiovascular Exercise Tolerance: Good hypertension, negative cardio ROS  Rhythm:regular Rate:Normal     Neuro/Psych  PSYCHIATRIC DISORDERS  Depression   Dementia negative neurological ROS     GI/Hepatic negative GI ROS, Neg liver ROS,,,  Endo/Other  Hypothyroidism    Renal/GU negative Renal ROS  negative genitourinary   Musculoskeletal   Abdominal   Peds  Hematology  (+) Blood dyscrasia, anemia   Anesthesia Other Findings   Reproductive/Obstetrics negative OB ROS                             Anesthesia Physical Anesthesia Plan  ASA: 2  Anesthesia Plan: General and Spinal   Post-op Pain Management:    Induction:   PONV Risk Score and Plan: Propofol infusion  Airway Management Planned:   Additional Equipment:   Intra-op Plan:   Post-operative Plan:   Informed Consent: I have reviewed the patients History and Physical, chart, labs and discussed the procedure including the risks, benefits and alternatives for the proposed anesthesia with the patient or authorized representative who has indicated his/her understanding and acceptance.     Dental Advisory Given  Plan Discussed with: CRNA  Anesthesia Plan Comments:        Anesthesia Quick Evaluation

## 2022-10-14 NOTE — Transfer of Care (Signed)
Immediate Anesthesia Transfer of Care Note  Patient: Lauren Lawrence  Procedure(s) Performed: ARTHROPLASTY BIPOLAR HIP (HEMIARTHROPLASTY) (Left: Hip)  Patient Location: PACU  Anesthesia Type:General  Level of Consciousness: drowsy  Airway & Oxygen Therapy: Patient Spontanous Breathing  Post-op Assessment: Report given to RN and Post -op Vital signs reviewed and stable  Post vital signs: Reviewed and stable  Last Vitals:  Vitals Value Taken Time  BP 130/57 10/14/22 1705  Temp 98   Pulse 70 10/14/22 1706  Resp 20 10/14/22 1706  SpO2 96 % 10/14/22 1706  Vitals shown include unvalidated device data.  Last Pain:  Vitals:   10/14/22 1416  TempSrc: Oral  PainSc:       Patients Stated Pain Goal: 8 (10/14/22 1416)  Complications: No notable events documented.

## 2022-10-14 NOTE — Op Note (Signed)
Hip prosthesis dictation   Pre op dx left femoral neck fracture displaced  Post op dx left femoral neck fracture displaced  Procedure bipolar partial hip replacement  Surgeon Vickki Hearing, Jr.  Implants DePuy [place] press-fit stem size 5 with a 49 head and a +5 neck bipolar  Indication for surgery displaced left femoral neck fracture  Transexamic  acid was given yes   ASSISTANTS: Rebeca Alert  ANESTHESIA:   General  BLOOD ADMINISTERED: None  DRAINS: none   LOCAL MEDICATIONS USED: Bupivacaine   SPECIMEN:  No Specimen  DISPOSITION OF SPECIMEN:  N/A  COUNTS: correct  DICTATION: .Dragon Dictation   The patient was taken to the recovery room in stable condition  PLAN OF CARE: PACU then regular floor  PATIENT DISPOSITION:  PACU - hemodynamically stable.   Delay start of Pharmacological VTE agent (>24hrs) due to surgical blood loss or risk of bleeding: Not applicable  Details of surgery: The patient was identified by 2 approved identification mechanisms. The operative extremity was evaluated and found to be acceptable for surgical treatment today. The chart was reviewed. The surgical site was confirmed and marked.  The patient was taken to the operating room and given appropriate antibiotic 2 g Ancef. This is consistent with the SCIP protocol.  The patient was given the following anesthetic: General  The patient was then placed in the lateral decubitus position with appropriate padding. The surgical site was prepped and draped sterilely.  Timeout was executed confirming the patient's name, surgical site, antibiotic administration, x-rays available, and implants were checked and were available.  Incision was made over the greater trochanter extended proximally and distally approximately 3 cm  Subcutaneous tissue was divided down to fascia which was then split in line with the skin incision and deep retractors were placed.  The greater trochanteric bursa was  resected exposing the abductors   The gluteus medius anterior half was subperiosteally dissected from the greater trochanter and tagged with #1 Vicryl sutures  The underlying gluteus minimus was split in continuity with the capsule and preserved tagging with Vicryl sutures as well   The femoral head was removed and measured 46 mm  The acetabular was inspected: It was normal  The hip was dislocated anteriorly into a sterile bag  Proximal femur was prepared starting with a femoral neck cutting guide, box osteotome, starter hole reamer and canal finder.  Trochanteric reamer was then passed.  Broaching was started with a size 1 and broached up to appropriate size based on proximal fit and fill.  This was a size 5  Trial reduction was then performed using a 5 stem 46 head +5 neck length.  This proved too long and we changed the reduction to a 1.5 neck length  The trial implants were then removed 2 drill holes were placed in the greater trochanter and a #5 Ethibond was passed through the drill holes   The acetabulum was irrigated and cleaned of any bony debris implants were placed and the hip was reduced  The hip was stable throughout the range of motion.  The hip was stable with  Hip flexion  Hip extension with external rotation  Sleep position stress test  Shuck test  Leg lengths were reapproximated to equal  Local anesthetic bupivacaine half percent was injected in the soft tissues, the abductors were repaired using the #5 Ethibond and then oversewn with #1 Braylon  The hip was then abducted the fascia was closed with #1 Braylon  Subcutaneous tissues were  closed with 0 Monocryl and 2-0 Monocryl and staples  Additional Marcaine was injected in the subfascial layer  A sterile dressing was applied  The patient was taken recovery room in stable condition   Postop plan  Weightbearing as tolerated Direct lateral hip precautions DVT prophylaxis for 30 days Remove staples  at 12 to 14 days Postop appointment scheduled for 28 days  [id number]

## 2022-10-14 NOTE — ED Triage Notes (Signed)
Pt presents with fall from home and now complaining of left hip pain. No blood thinners per ems. Pt hx of dementia. No shortening or rotation.

## 2022-10-14 NOTE — Progress Notes (Signed)
Patient seen and evaluated, chart reviewed, please see EMR for updated orders. Please see full H&P dictated by admitting physician Dr. Thomes Dinning for same date of service.    Brief Summary:  82 y.o. female with medical history significant of hypertension, hypothyroidism, depression, dementia admitted with left hip fracture after a fall at home  A/p 1)Left hip fracture/status post mechanical fall at home -Plan is for bipolar hip replacement on 10/14/22 -Further management per orthopedic team  2)HTN--stable, continue Toprol-XL 100 mg daily along with losartan 100 mg daily -IV labetalol as needed  3)Chronic Leukopenia--WBC stable just above 200 this time  4)Hypothyroidism--continue levothyroxine 112 mcg daily  5)Chronic Thrombocytopenia- -platelets currently down to 77 K --Monitor closely anticipate some drop preoperatively  6)Dementia--stable, continue Aricept  Plan of care discussed with patient and patient's daughter Rosey Bath at bedside -Prior to admission patient lived with her daughter Rosey Bath -Anticipate discharge to SNF rehab - Total care time 57 minutes  Patient seen and evaluated, chart reviewed, please see EMR for updated orders. Please see full H&P dictated by admitting physician Dr. Thomes Dinning for same date of service.   Shon Hale, MD

## 2022-10-14 NOTE — Op Note (Signed)
Orthopaedic Surgery Operative Note (CSN: [id number])  Lauren Lawrence  [birth year] Date of Surgery: 10/14/2022 Implants: Implant Name Type Inv. Item Serial No. Manufacturer Lot No. LRB No. Used Action  STEM [employer] BASIC PRESSFIT SZ5 - [id number] Hips STEM [employer] PRESSFIT SZ5  [employer] [id number] Left 1 Implanted  BIPOLAR PROS AML 46 - [id number] Hips BIPOLAR PROS AML 46  [employer] [id number] Left 1 Implanted  HEAD FEM STD 28X+1.5 STRL - [id number] Hips HEAD FEM STD 28X+1.5 STRL  [employer] [id number] Left 1 Implanted    Hip prosthesis dictation   Pre op dx left femoral neck fracture displaced  Post op dx left femoral neck fracture displaced  Procedure bipolar partial hip replacement  Surgeon Vickki Hearing, Jr.  Implants De[employer]uy [place] press-fit stem size 5 with a 49 head and a +5 neck bipolar  Indication for surgery displaced left femoral neck fracture  Transexamic  acid was given yes   ASSISTANTS: Rebeca Alert  ANESTHESIA:   General  BLOOD ADMINISTERED: None  DRAINS: none   LOCAL MEDICATIONS USED: Bupivacaine   SPECIMEN:  No Specimen  DISPOSITION OF SPECIMEN:  N/A  COUNTS: correct  DICTATION: .Dragon Dictation   The patient was taken to the recovery room in stable condition  PLAN OF CARE: PACU then regular floor  PATIENT DISPOSITION:  PACU - hemodynamically stable.   Delay start of Pharmacological VTE agent (>24hrs) due to surgical blood loss or risk of bleeding: Not applicable  Details of surgery: The patient was identified by 2 approved identification mechanisms. The operative extremity was evaluated and found to be acceptable for surgical treatment today. The chart was reviewed. The surgical site was confirmed and marked.  The patient was taken to the operating room and given appropriate antibiotic 2 g Ancef. This is consistent with the SCIP protocol.  The patient was given the following anesthetic: General  The patient was  then placed in the lateral decubitus position with appropriate padding. The surgical site was prepped and draped sterilely.  Timeout was executed confirming the patient's name, surgical site, antibiotic administration, x-rays available, and implants were checked and were available.  Incision was made over the greater trochanter extended proximally and distally approximately 3 cm  Subcutaneous tissue was divided down to fascia which was then split in line with the skin incision and deep retractors were placed.  The greater trochanteric bursa was resected exposing the abductors   The gluteus medius anterior half was subperiosteally dissected from the greater trochanter and tagged with #1 Vicryl sutures  The underlying gluteus minimus was split in continuity with the capsule and preserved tagging with Vicryl sutures as well   The femoral head was removed and measured 46 mm  The acetabular was inspected: It was normal  The hip was dislocated anteriorly into a sterile bag  Proximal femur was prepared starting with a femoral neck cutting guide, box osteotome, starter hole reamer and canal finder.  Trochanteric reamer was then passed.  Broaching was started with a size 1 and broached up to appropriate size based on proximal fit and fill.  This was a size 5  Trial reduction was then performed using a 5 stem 46 head +5 neck length.  This proved too long and we changed the reduction to a 1.5 neck length  The trial implants were then removed 2 drill holes were placed in the greater trochanter and a #5 Ethibond was passed through the drill holes   The acetabulum was irrigated and cleaned  of any bony debris implants were placed and the hip was reduced  The hip was stable throughout the range of motion.  The hip was stable with  Hip flexion  Hip extension with external rotation  Sleep position stress test  Shuck test  Leg lengths were reapproximated to equal  Local anesthetic  bupivacaine half percent was injected in the soft tissues, the abductors were repaired using the #5 Ethibond and then oversewn with #1 Braylon  The hip was then abducted the fascia was closed with #1 Braylon  Subcutaneous tissues were closed with 0 Monocryl and 2-0 Monocryl and staples  Additional Marcaine was injected in the subfascial layer  A sterile dressing was applied  The patient was taken recovery room in stable condition   Postop plan  Weightbearing as tolerated Direct lateral hip precautions DVT prophylaxis for 30 days Postop appointment scheduled for 28 days  [id number]

## 2022-10-14 NOTE — Brief Op Note (Signed)
10/14/2022  5:26 PM  PATIENT:  Lauren Lawrence  82 y.o. female  PRE-OPERATIVE DIAGNOSIS:  left hip fracture  POST-OPERATIVE DIAGNOSIS:  left hip fracture  PROCEDURE:  Procedure(s): ARTHROPLASTY BIPOLAR HIP (HEMIARTHROPLASTY) (Left)  SURGEON:  Surgeon(s) and Role:    Vickki Hearing, MD - Primary  PHYSICIAN ASSISTANT:   ASSISTANTS: Rebeca Alert  ANESTHESIA:   general  EBL:  100 mL   BLOOD ADMINISTERED:none  DRAINS: none   LOCAL MEDICATIONS USED:  BUPIVICAINE   SPECIMEN:  No Specimen  DISPOSITION OF SPECIMEN:  N/A  COUNTS:  YES  TOURNIQUET:  * No tourniquets in log *  DICTATION: .Dragon Dictation  PLAN OF CARE: Admit to inpatient   PATIENT DISPOSITION:  PACU - hemodynamically stable.   Delay start of Pharmacological VTE agent (>24hrs) due to surgical blood loss or risk of bleeding: not applicable

## 2022-10-14 NOTE — Interval H&P Note (Signed)
History and Physical Interval Note:  10/14/2022 2:43 PM  Lauren Lawrence  has presented today for surgery, with the diagnosis of left hip fracture.  The various methods of treatment have been discussed with the patient and family. After consideration of risks, benefits and other options for treatment, the patient has consented to  Procedure(s): ARTHROPLASTY BIPOLAR HIP (HEMIARTHROPLASTY) (Left) as a surgical intervention.  The patient's history has been reviewed, patient examined, no change in status, stable for surgery.  I have reviewed the patient's chart and labs.  Questions were answered to the patient's satisfaction.     Fuller Canada

## 2022-10-15 DIAGNOSIS — S72002A Fracture of unspecified part of neck of left femur, initial encounter for closed fracture: Principal | ICD-10-CM

## 2022-10-15 LAB — COMPREHENSIVE METABOLIC PANEL
ALT: 32 U/L (ref 0–44)
AST: 50 U/L — ABNORMAL HIGH (ref 15–41)
Albumin: 2.8 g/dL — ABNORMAL LOW (ref 3.5–5.0)
Alkaline Phosphatase: 65 U/L (ref 38–126)
Anion gap: 7 (ref 5–15)
BUN: 23 mg/dL (ref 8–23)
CO2: 24 mmol/L (ref 22–32)
Calcium: 8.1 mg/dL — ABNORMAL LOW (ref 8.9–10.3)
Chloride: 105 mmol/L (ref 98–111)
Creatinine, Ser: 0.92 mg/dL (ref 0.44–1.00)
GFR, Estimated: >60 mL/min (ref >60)
Glucose, Bld: 126 mg/dL — ABNORMAL HIGH (ref 70–99)
Potassium: 3.5 mmol/L (ref 3.5–5.1)
Sodium: 136 mmol/L (ref 135–145)
Total Bilirubin: 0.9 mg/dL (ref 0.3–1.2)
Total Protein: 5.3 g/dL — ABNORMAL LOW (ref 6.5–8.1)

## 2022-10-15 LAB — CBC
HCT: 33.2 % — ABNORMAL LOW (ref 36.0–46.0)
Hemoglobin: 10.6 g/dL — ABNORMAL LOW (ref 12.0–15.0)
MCH: 28.9 pg (ref 26.0–34.0)
MCHC: 31.9 g/dL (ref 30.0–36.0)
MCV: 90.5 fL (ref 80.0–100.0)
Platelets: 81 10*3/uL — ABNORMAL LOW (ref 150–400)
RBC: 3.67 MIL/uL — ABNORMAL LOW (ref 3.87–5.11)
RDW: 20.8 % — ABNORMAL HIGH (ref 11.5–15.5)
WBC: 5.9 10*3/uL (ref 4.0–10.5)
nRBC: 0 % (ref 0.0–0.2)

## 2022-10-15 MED ORDER — OXYCODONE HCL 5 MG PO TABS: 5.0000 mg | ORAL_TABLET | ORAL | Status: DC | PRN

## 2022-10-15 MED ORDER — SENNOSIDES-DOCUSATE SODIUM 8.6-50 MG PO TABS
2.0000 | ORAL_TABLET | Freq: Two times a day (BID) | ORAL | Status: DC
  Administered 2022-10-15 – 2022-10-16 (×2): 2 via ORAL
  Filled 2022-10-15 (×2): qty 2

## 2022-10-15 MED ORDER — FENTANYL CITRATE PF 50 MCG/ML IJ SOSY: 25.0000 ug | PREFILLED_SYRINGE | INTRAMUSCULAR | Status: DC | PRN

## 2022-10-15 NOTE — Care Management Important Message (Signed)
Important Message  Patient Details  Name: Lauren Lawrence MRN: [id number] Date of Birth: August 11, 1940   Medicare Important Message Given:  Yes (spoke with daughter in room- Kenney Houseman)     Corey Harold 10/15/2022, 4:13 PM

## 2022-10-15 NOTE — Progress Notes (Signed)
Pt has voided since her foley was removed this morning This nurse did a bladder scan just in case and it was 42 ml in bladder.

## 2022-10-15 NOTE — Progress Notes (Signed)
Subjective: 1 Day Post-Op Procedure(s) (LRB): ARTHROPLASTY BIPOLAR HIP (HEMIARTHROPLASTY) (Left)  Baseline mental acuity with intermittent episodes of forgetfulness comfortable in the bed.  Objective: Vital signs in last 24 hours: Temp:  [97.4 F (36.3 C)-98.2 F (36.8 C)] 98.1 F (36.7 C) (06/28 0656) Pulse Rate:  [57-87] 58 (06/28 0656) Resp:  [10-21] 16 (06/27 1745) BP: (130-173)/(45-65) 139/54 (06/28 0656) SpO2:  [91 %-100 %] 98 % (06/28 0656)  Intake/Output from previous day: 06/27 0701 - 06/28 0700 In: 2104.4 [I.V.:1772; IV Piggyback:332.4] Out: 600 [Urine:500; Blood:100] Intake/Output this shift: No intake/output data recorded.  Recent Labs    10/14/22 0358 10/15/22 0426  HGB 12.5 10.6*   Recent Labs    10/14/22 0358 10/15/22 0426  WBC 2.2* 5.9  RBC 4.38 3.67*  HCT 38.9 33.2*  PLT 77* 81*   Recent Labs    10/14/22 0358 10/15/22 0426  NA 138 136  K 3.6 3.5  CL 104 105  CO2 24 24  BUN 15 23  CREATININE 0.68 0.92  GLUCOSE 110* 126*  CALCIUM 8.8* 8.1*   No results for input(s): "LABPT", "INR" in the last 72 hours.  Neurologically intact Neurovascular intact Sensation intact distally Intact pulses distally Dorsiflexion/Plantar flexion intact Dressing   Assessment/Plan: 1 Day Post-Op Procedure(s) (LRB): ARTHROPLASTY BIPOLAR HIP (HEMIARTHROPLASTY) (Left) Discharge to SNF bed available  Today we will try to stand the patient up      Fuller Canada 10/15/2022, 7:48 AM

## 2022-10-15 NOTE — TOC Progression Note (Addendum)
Transition of Care [facility]) - Progression Note    Patient Details  Name: Lauren Lawrence MRN: [id number] Date of Birth: 1940-11-10  Transition of Care [facility]) CM/SW Contact  Elliot Gault, LCSW Phone Number: 10/15/2022, 1:44 PM  Clinical Narrative:     TOC following. Bed offers reviewed with pt/family. Awaiting decision. Insurance auth started. Awaiting determination.  Per MD, pt medically stable for dc.  1609: Pt/family have selected [facility] for SNF rehab. SNF [facility] is still pending. Spoke with admissions at [facility] and they do not accept weekend admissions. They can admit pt Monday if she has auth. Updated MD.  Jory Sims will follow.  Expected Discharge Plan: Skilled Nursing Facility Barriers to Discharge: Continued Medical Work up  Expected Discharge Plan and Services     Post Acute Care Choice: Skilled Nursing Facility Living arrangements for the past 2 months: Single Family Home                                       Social Determinants of Health (SDOH) Interventions SDOH Screenings   Food Insecurity: No Food Insecurity (10/14/2022)  Housing: Low Risk  (10/14/2022)  Transportation Needs: No Transportation Needs (10/14/2022)  Utilities: Not At Risk (10/14/2022)  Alcohol Screen: Low Risk  (05/14/2022)  Depression (PHQ2-9): High Risk (08/16/2022)  Financial Resource Strain: Low Risk  (07/05/2022)  Physical Activity: Insufficiently Active (07/05/2022)  Social Connections: Unknown (07/05/2022)  Stress: No Stress Concern Present (07/05/2022)  Tobacco Use: Low Risk  (10/14/2022)    Readmission Risk Interventions     No data to display

## 2022-10-15 NOTE — Progress Notes (Signed)
95% on RA. Nasal Cannula removed.

## 2022-10-15 NOTE — Progress Notes (Signed)
PROGRESS NOTE     Lauren Lawrence, is a 82 y.o. female, DOB - 25-Jan-1941, ZOX:[id number]  Admit date - 10/14/2022   Admitting Physician Frankey Shown, DO  Outpatient Primary MD for the patient is Fisher, Demetrios Isaacs, MD  LOS - 1  Chief Complaint  Patient presents with   Fall        Brief Summary:  82 y.o. female with medical history significant of hypertension, hypothyroidism, depression, dementia admitted with left hip fracture after a fall at home  -As of 10/15/2022 patient is medically stable for transfer to SNF rehab when a bed can be found and when insurance authorization is obtained   A/p 1)Left hip fracture/status post mechanical fall at home -s/p bipolar hip replacement on 10/14/22 -Further management per orthopedic team Weightbearing as tolerated Direct lateral hip precautions DVT prophylaxis for 30 days--ASA 325 mg daily Remove staples at 12 to 14 days Postop ortho appointment scheduled for 28 days -PT eval appreciated recommends SNF rehab   2)HTN--stable, continue Toprol-XL 100 mg daily along with losartan 100 mg daily -IV labetalol as needed   3)Chronic Leukopenia--WBC-actually gone up to 5.9 -Suspect reactive postoperatively  4)Hypothyroidism--continue levothyroxine 112 mcg daily   5)Chronic Thrombocytopenia- -platelets currently down to around 80 K --Monitor closely anticipate some drop preoperatively   6)Dementia--stable, continue Aricept  7) chronic anemia--Hgb currently above 10 which is around patient's baseline watch closely postoperatively especially while on aspirin 325 mg daily for DVT prophylaxis  Status is: Inpatient   Disposition: The patient is from: Home              Anticipated d/c is to: SNF              Anticipated d/c date is: 1 day              Patient currently is medically stable to d/c.--  Barriers:-As of 10/15/2022 patient is medically stable for transfer to SNF rehab when a bed can be found and when insurance authorization is  obtained  Code Status :  -  Code Status: Full Code   Family Communication:   Discussed with daughters Kenney Houseman and Rosey Bath   DVT Prophylaxis  :   - SCDs  SCDs Start: 10/14/22 1745 Place TED hose Start: 10/14/22 1745 SCDs Start: 10/14/22 0606   Lab Results  Component Value Date   PLT 81 (L) 10/15/2022    Inpatient Medications  Scheduled Meds:  acetaminophen  500 mg Oral Q6H   aspirin EC  325 mg Oral Q breakfast   donepezil  10 mg Oral QHS   Ferrous Fumarate  106 mg of iron Oral Daily   levothyroxine  112 mcg Oral Daily   losartan  100 mg Oral Daily   metoprolol succinate  100 mg Oral Daily   senna-docusate  2 tablet Oral BID   traMADol  50 mg Oral Q6H   Continuous Infusions:  sodium chloride 100 mL/hr at 10/15/22 8119   methocarbamol (ROBAXIN) IV     PRN Meds:.hydrALAZINE, menthol-cetylpyridinium **OR** phenol, methocarbamol **OR** methocarbamol (ROBAXIN) IV, metoCLOPramide **OR** metoCLOPramide (REGLAN) injection, morphine injection, ondansetron **OR** ondansetron (ZOFRAN) IV, ondansetron **OR** [DISCONTINUED] ondansetron (ZOFRAN) IV, polyethylene glycol   Anti-infectives (From admission, onward)    Start     Dose/Rate Route Frequency Ordered Stop   10/14/22 1830  ceFAZolin (ANCEF) IVPB 2g/100 mL premix        2 g 200 mL/hr over 30 Minutes Intravenous Every 6 hours 10/14/22 1744 10/15/22 0026   10/14/22 1500  ceFAZolin (ANCEF) IVPB 2g/100 mL premix        2 g 200 mL/hr over 30 Minutes Intravenous On call to O.R. 10/14/22 1411 10/14/22 1549      Subjective: Tamari Trivedi today has no fevers, no emesis,  No chest pain,   -Resting comfortably, daughter Archie Patten and son-in-law at bedside  Objective: Vitals:   10/15/22 0656 10/15/22 1336 10/15/22 1712 10/15/22 1948  BP: (!) 139/54 (!) 163/53  (!) 157/52  Pulse: (!) 58 (!) 51  (!) 54  Resp:    20  Temp: 98.1 F (36.7 C) 98 F (36.7 C)  (!) 97.5 F (36.4 C)  TempSrc: Oral Oral  Oral  SpO2: 98% 100% 95% 94%  Weight:       Height:        Intake/Output Summary (Last 24 hours) at 10/15/2022 1955 Last data filed at 10/15/2022 0453 Gross per 24 hour  Intake 955.72 ml  Output 300 ml  Net 655.72 ml   Filed Weights   10/14/22 0631  Weight: 54.4 kg   Physical Exam  Gen:- Awake Alert,  in no apparent distress  HEENT:- [place].AT, No sclera icterus Neck-Supple Neck,No JVD,.  Lungs-  CTAB , fair symmetrical air movement CV- S1, S2 normal, regular  Abd-  +ve B.Sounds, Abd Soft, No tenderness,    Extremity/Skin:- No  edema, pedal pulses present  Psych-affect is appropriate, oriented x3, underlying/baseline mild memory and cognitive deficits Neuro--weakness, no new focal deficits, no tremors MSK-left hip postop wound clean dry and intact  Data Reviewed: I have personally reviewed following labs and imaging studies  CBC: Recent Labs  Lab 10/14/22 0358 10/15/22 0426  WBC 2.2* 5.9  NEUTROABS 1.3*  --   HGB 12.5 10.6*  HCT 38.9 33.2*  MCV 88.8 90.5  PLT 77* 81*   Basic Metabolic Panel: Recent Labs  Lab 10/14/22 0358 10/15/22 0426  NA 138 136  K 3.6 3.5  CL 104 105  CO2 24 24  GLUCOSE 110* 126*  BUN 15 23  CREATININE 0.68 0.92  CALCIUM 8.8* 8.1*  MG 1.9  --   PHOS 3.1  --    GFR: Estimated Creatinine Clearance: 36.2 mL/min (by C-G formula based on SCr of 0.92 mg/dL). Liver Function Tests: Recent Labs  Lab 10/15/22 0426  AST 50*  ALT 32  ALKPHOS 65  BILITOT 0.9  PROT 5.3*  ALBUMIN 2.8*   Radiology Studies: DG HIP UNILAT WITH PELVIS 2-3 VIEWS LEFT  Result Date: 10/14/2022 CLINICAL DATA:  Postop left hip. EXAM: DG HIP (WITH OR WITHOUT PELVIS) 2-3V LEFT COMPARISON:  Preoperative radiograph FINDINGS: Left hip arthroplasty in expected alignment. No periprosthetic lucency or fracture. Recent postsurgical change includes air and edema in the soft tissues. IMPRESSION: Left hip arthroplasty without immediate postoperative complication. Electronically Signed   By: Narda Rutherford M.D.   On:  10/14/2022 17:51   DG Hip Unilat W or Wo Pelvis 2-3 Views Left  Result Date: 10/14/2022 CLINICAL DATA:  82 year old female status post fall.  Left hip pain. EXAM: DG HIP (WITH OR WITHOUT PELVIS) 2-3V LEFT COMPARISON:  CT Abdomen and Pelvis 02/03/2010. FINDINGS: Transverse, mildly comminuted left femoral neck fracture. 1/2 shaft width displacement and mild varus impaction on the frog-leg lateral view. The proximal left femur intertrochanteric segment appears to remain intact. Left femoral head remains normally located. No acute pelvis fracture identified. Grossly intact proximal right femur. Negative visible bowel gas. IMPRESSION: Positive for transverse, mildly comminuted Left Femoral Neck Fracture. Mild displacement and  varus impaction. Electronically Signed   By: Odessa Fleming M.D.   On: 10/14/2022 05:01   DG Chest 1 View  Result Date: 10/14/2022 CLINICAL DATA:  82 year old female status post fall. Left femoral neck fracture. EXAM: CHEST  1 VIEW COMPARISON:  No prior chest imaging. CT Abdomen and Pelvis 02/03/2010. FINDINGS: AP supine view at 0421 hours. Mild cardiomegaly. Other mediastinal contours are within normal limits. Visualized tracheal air column is within normal limits. Normal lung volumes. Allowing for portable technique the lungs are clear. Visible osseous structures appear intact. Paucity of bowel gas in the visible abdomen. IMPRESSION: Mild cardiomegaly. No acute cardiopulmonary abnormality. Electronically Signed   By: Odessa Fleming M.D.   On: 10/14/2022 04:59   CT HEAD WO CONTRAST ( )  Result Date: 10/14/2022 CLINICAL DATA:  82 year old female status post fall with hip pain. EXAM: CT HEAD WITHOUT CONTRAST TECHNIQUE: Contiguous axial images were obtained from the base of the skull through the vertex without intravenous contrast. RADIATION DOSE REDUCTION: This exam was performed according to the departmental dose-optimization program which includes automated exposure control, adjustment of the mA  and/or kV according to patient size and/or use of iterative reconstruction technique. COMPARISON:  Brain MRI 10/17/2019. FINDINGS: Brain: Mild motion artifact at the base of skull. No midline shift, ventriculomegaly, mass effect, evidence of mass lesion, intracranial hemorrhage or evidence of cortically based acute infarction. Mild left unilateral basal ganglia vascular calcifications. Mild additional generalized cerebral volume loss since the 2021 MRI. Gray-white differentiation preserved, within normal limits for age. Vascular: Calcified atherosclerosis at the skull base. No suspicious intracranial vascular hyperdensity. Skull: Mild motion artifact at the skull base. No acute osseous abnormality identified. Sinuses/Orbits: Visualized paranasal sinuses and mastoids are stable and well aerated. Other: No acute orbit or scalp soft tissue injury identified. IMPRESSION: Mild motion artifact at the skull base. No acute intracranial abnormality or acute traumatic injury identified. Electronically Signed   By: Odessa Fleming M.D.   On: 10/14/2022 04:30    Scheduled Meds:  acetaminophen  500 mg Oral Q6H   aspirin EC  325 mg Oral Q breakfast   donepezil  10 mg Oral QHS   Ferrous Fumarate  106 mg of iron Oral Daily   levothyroxine  112 mcg Oral Daily   losartan  100 mg Oral Daily   metoprolol succinate  100 mg Oral Daily   senna-docusate  2 tablet Oral BID   traMADol  50 mg Oral Q6H   Continuous Infusions:  sodium chloride 100 mL/hr at 10/15/22 9147   methocarbamol (ROBAXIN) IV       LOS: 1 day    Shon Hale M.D on 10/15/2022 at 7:55 PM  Go to [email or web address] - for contact info  [facility] - Office  [phone]  If 7PM-7AM, please contact night-coverage [email or web address] 10/15/2022, 7:55 PM

## 2022-10-15 NOTE — Progress Notes (Signed)
Patient alert and oriented to self and place, presents with times of confusion. Attempted to take oxygen off multiple times through the night thinking it was her glasses. Patient redirected. Minimal complaints of post op pain. Surgical dressing clean, dry, and intact. No acute overnight events.

## 2022-10-15 NOTE — Evaluation (Signed)
Physical Therapy Evaluation Patient Details Name: Lauren Lawrence MRN: [id number] DOB: February 17, 1941 Today's Date: 10/15/2022  History of Present Illness  Lauren Lawrence is a 82 y.o. female with medical history significant of hypertension, hypothyroidism, depression, dementia who presents to the emergency department via EMS from home due to a fall at home.  Patient was not sure how she fell (possibly due to history of dementia), history was obtained from ED physician and ED medical record.  Per report, patient lives with daughter, daughter heard a sound and checked on patient and she was found on the ground moaning with pain, it was thought that she fell when she got up to go to the bathroom.  EMS was activated and patient was taken to the ED for further evaluation and management. S/P ARTHROPLASTY BIPOLAR HIP (HEMIARTHROPLASTY) (Left) 10/14/22.   Clinical Impression  Pt admitted with above diagnosis. Patient supine in bed with family members present upon therapist arrival. Patient agreeable to participating in evaluation today. Patient required min assist for LLE and trunk for supine to sit. Patient demonstrated difficult following directions for step sequencing and hand placement during transfers and ambulation. Patient required min to mod assist for transfers and ambulation using RW and demonstrated a slow labored cadence using RW with short, shuffling side steps to the right to recliner. Patient limited by fatigue and pain. Patient on 2.5 LPM O2 through nasal cannula throughout session. Patient required multiple cues to keep nasal cannula in place.  Pt currently with functional limitations due to the deficits listed below (see PT Problem List). Pt will benefit from acute skilled PT to increase their independence and safety with mobility to allow discharge.        Recommendations for follow up therapy are one component of a multi-disciplinary discharge planning process, led by the attending physician.   Recommendations may be updated based on patient status, additional functional criteria and insurance authorization.  Follow Up Recommendations Can patient physically be transported by private vehicle: No     Assistance Recommended at Discharge Intermittent Supervision/Assistance  Patient can return home with the following  A lot of help with walking and/or transfers;A lot of help with bathing/dressing/bathroom;Assistance with cooking/housework;Assist for transportation;Help with stairs or ramp for entrance    Equipment Recommendations Other (comment) (to be detemined at next venue of care)  Recommendations for Other Services       Functional Status Assessment Patient has had a recent decline in their functional status and demonstrates the ability to make significant improvements in function in a reasonable and predictable amount of time.     Precautions / Restrictions Precautions Precautions: Fall Restrictions Weight Bearing Restrictions: Yes LLE Weight Bearing: Weight bearing as tolerated      Mobility  Bed Mobility Overal bed mobility: Needs Assistance Bed Mobility: Supine to Sit     Supine to sit: HOB elevated, Min assist     General bed mobility comments: increased time, slow labored movement    Transfers Overall transfer level: Needs assistance Equipment used: Rolling walker (2 wheels) Transfers: Sit to/from Stand, Bed to chair/wheelchair/BSC Sit to Stand: Min assist, Mod assist   Step pivot transfers: Min assist, Mod assist   Anterior-Posterior transfers: Min guard   General transfer comment: slow, labored movement; cues for step sequencing and hand placement using RW; increased time to process instructions    Ambulation/Gait Ambulation/Gait assistance: Min assist, Mod assist Gait Distance (Feet): 5 Feet Assistive device: Rolling walker (2 wheels) Gait Pattern/deviations: Step-to pattern, Decreased step length -  right, Decreased step length - left,  Decreased stance time - left, Decreased stride length, Decreased weight shift to left, Antalgic Gait velocity: decreased     General Gait Details: slow labored cadence using RW with short, shuffling side steps to the right to recliner; limited by fatigue and pain; on 2.5 LPM O2 throughout  Stairs            Wheelchair Mobility    Modified Rankin (Stroke Patients Only)       Balance Overall balance assessment: Needs assistance Sitting-balance support: Bilateral upper extremity supported, Feet supported Sitting balance-Leahy Scale: Fair Sitting balance - Comments: seated at EOB Postural control: Right lateral lean (due to left hip pain) Standing balance support: Bilateral upper extremity supported, During functional activity, Reliant on assistive device for balance Standing balance-Leahy Scale: Poor Standing balance comment: fair/poor with RW         Pertinent Vitals/Pain Pain Assessment Pain Assessment: PAINAD Breathing: occasional labored breathing, short period of hyperventilation Negative Vocalization: none Facial Expression: smiling or inexpressive Body Language: relaxed Consolability: no need to console (grimacing with movement of left lower extremity) PAINAD Score: 1 Pain Intervention(s): Limited activity within patient's tolerance, Monitored during session, Premedicated before session, Repositioned    Home Living Family/patient expects to be discharged to:: Skilled nursing facility Living Arrangements: Children (daughter) Available Help at Discharge: Family;Available PRN/intermittently Type of Home: House Home Access: Stairs to enter Entrance Stairs-Rails: Right Entrance Stairs-Number of Steps: 3   Home Layout: One level Home Equipment: None      Prior Function Prior Level of Function : Needs assist  Cognitive Assist : ADLs (cognitive)   ADLs (Cognitive): Intermittent cues Physical Assist : ADLs (physical)   ADLs (physical): IADLs Mobility  Comments: reports community ambulator without assistive device       Hand Dominance        Extremity/Trunk Assessment   Upper Extremity Assessment Upper Extremity Assessment: Overall WFL for tasks assessed    Lower Extremity Assessment Lower Extremity Assessment: LLE deficits/detail LLE: Unable to fully assess due to pain LLE Coordination: decreased gross motor    Cervical / Trunk Assessment Cervical / Trunk Assessment: Normal  Communication   Communication: Expressive difficulties  Cognition Arousal/Alertness: Awake/alert Behavior During Therapy: WFL for tasks assessed/performed Overall Cognitive Status: History of cognitive impairments - at baseline          General Comments      Exercises     Assessment/Plan    PT Assessment Patient needs continued PT services  PT Problem List Decreased strength;Decreased balance;Decreased cognition;Decreased mobility;Decreased knowledge of use of DME;Decreased activity tolerance;Pain       PT Treatment Interventions DME instruction;Functional mobility training;Balance training;Patient/family education;Therapeutic activities;Gait training;Therapeutic exercise;Cognitive remediation    PT Goals (Current goals can be found in the Care Plan section)  Acute Rehab PT Goals Patient Stated Goal: Go home after rehab PT Goal Formulation: With patient/family Time For Goal Achievement: 10/29/22 Potential to Achieve Goals: Good    Frequency Min 3X/week        AM-PAC PT "6 Clicks" Mobility  Outcome Measure Help needed turning from your back to your side while in a flat bed without using bedrails?: A Little Help needed moving from lying on your back to sitting on the side of a flat bed without using bedrails?: A Little Help needed moving to and from a bed to a chair (including a wheelchair)?: A Lot Help needed standing up from a chair using your arms (e.g., wheelchair or bedside chair)?:  A Lot Help needed to walk in hospital room?:  A Lot Help needed climbing 3-5 steps with a railing? : A Lot 6 Click Score: 14    End of Session Equipment Utilized During Treatment: Gait belt;Oxygen Activity Tolerance: Patient tolerated treatment well;Patient limited by fatigue;Patient limited by pain Patient left: with call bell/phone within reach;with family/visitor present;in chair Nurse Communication: Mobility status PT Visit Diagnosis: Unsteadiness on feet (R26.81);Other abnormalities of gait and mobility (R26.89);Muscle weakness (generalized) (M62.81)    Time: 8119-1478 PT Time Calculation (min) (ACUTE ONLY): 25 min   Charges:   PT Evaluation $PT Eval Low Complexity: 1 Low PT Treatments $Therapeutic Activity: 8-22 mins        Katina Dung. Hartnett-Rands, MS, PT [facility] [phone]  Britta Mccreedy  Hartnett-Rands 10/15/2022, 12:56 PM

## 2022-10-15 NOTE — Progress Notes (Signed)
Foley catheter removed per order, patient tolerated well.

## 2022-10-15 NOTE — Plan of Care (Signed)
  Problem: Acute Rehab PT Goals(only PT should resolve) Goal: Pt will Roll Supine to Side Outcome: Progressing Flowsheets (Taken 10/15/2022 1303) Pt will Roll Supine to Side: with min assist Goal: Pt Will Go Supine/Side To Sit Outcome: Progressing Flowsheets (Taken 10/15/2022 1303) Pt will go Supine/Side to Sit:  with minimal assist  with HOB elevated Goal: Pt Will Go Sit To Supine/Side Outcome: Progressing Flowsheets (Taken 10/15/2022 1303) Pt will go Sit to Supine/Side:  with minimal assist  with HOB  elevated Goal: Patient Will Transfer Sit To/From Stand Outcome: Progressing Flowsheets (Taken 10/15/2022 1303) Patient will transfer sit to/from stand: with minimal assist Goal: Pt Will Transfer Bed To Chair/Chair To Bed Outcome: Progressing Flowsheets (Taken 10/15/2022 1303) Pt will Transfer Bed to Chair/Chair to Bed: with min assist Goal: Pt Will Ambulate Outcome: Progressing Flowsheets (Taken 10/15/2022 1303) Pt will Ambulate:  10 feet  with rolling walker  with cues (comment type and amount) Note: Step sequencing and hand placement using RW   Britta Mccreedy D. Hartnett-Rands, MS, PT [facility] [phone] 10/15/2022

## 2022-10-16 DIAGNOSIS — S72002A Fracture of unspecified part of neck of left femur, initial encounter for closed fracture: Secondary | ICD-10-CM | POA: Diagnosis not present

## 2022-10-16 MED ORDER — TRAZODONE HCL 50 MG PO TABS
100.0000 mg | ORAL_TABLET | Freq: Every day | ORAL | Status: AC
  Filled 2022-10-16 (×2): qty 2

## 2022-10-16 MED ORDER — MELATONIN 3 MG PO TABS
6.0000 mg | ORAL_TABLET | Freq: Once | ORAL | Status: AC
  Administered 2022-10-16: 6 mg via ORAL
  Filled 2022-10-16: qty 2

## 2022-10-16 MED ORDER — RIVAROXABAN 10 MG PO TABS
10.0000 mg | ORAL_TABLET | Freq: Every day | ORAL | Status: DC
  Administered 2022-10-16: 10 mg via ORAL
  Filled 2022-10-16: qty 1

## 2022-10-16 NOTE — Progress Notes (Signed)
PROGRESS NOTE   Lauren Lawrence, is a 82 y.o. female, DOB - 26-Feb-1941, ZOX:[id number]  Admit date - 10/14/2022   Admitting Physician Frankey Shown, DO  Outpatient Primary MD for the patient is Fisher, Demetrios Isaacs, MD  LOS - 2  Chief Complaint  Patient presents with   Fall        Brief Summary:  82 y.o. female with medical history significant of hypertension, hypothyroidism, depression, dementia admitted with left hip fracture after a fall at home  -As of 10/16/2022 patient is medically stable for transfer to SNF rehab when a bed can be found and when insurance authorization is obtained   A/p 1)Left hip fracture/status post mechanical fall at home -s/p bipolar hip replacement on 10/14/22 -Further management per orthopedic team Weightbearing as tolerated Direct lateral hip precautions DVT prophylaxis for 30 days-- -Xarelto 10 mg daily while here , may discharge on ASA 325 mg daily when going to rehab  -remove staples at 12 to 14 days Postop ortho appointment scheduled for 28 days -PT eval appreciated recommends SNF rehab   2)HTN--stable, continue Toprol-XL 100 mg daily along with losartan 100 mg daily -IV labetalol as needed   3)Chronic Leukopenia--WBC-actually gone up to 5.9 -Suspect reactive postoperatively  4)Hypothyroidism--continue levothyroxine 112 mcg daily   5)Chronic Thrombocytopenia- -platelets currently down to around 80 K --Monitor closely anticipate some drop preoperatively   6)Dementia--stable, continue Aricept  7) chronic anemia--Hgb currently above 10 which is around patient's baseline watch closely postoperatively especially while on  DVT prophylaxis  Status is: Inpatient   Disposition: The patient is from: Home              Anticipated d/c is to: SNF              Anticipated d/c date is: 1 day              Patient currently is medically stable to d/c.--  Barriers:-As of 10/16/2022 patient is medically stable for transfer to SNF rehab when a bed can be  found and when insurance authorization is obtained  Code Status :  -  Code Status: Full Code   Family Communication:   Discussed with daughters Kenney Houseman and Rosey Bath and son in Physiological scientist  DVT Prophylaxis  :   - SCDs  SCDs Start: 10/14/22 1745 Place TED hose Start: 10/14/22 1745 SCDs Start: 10/14/22 0606   Lab Results  Component Value Date   PLT 81 (L) 10/15/2022    Inpatient Medications  Scheduled Meds:  acetaminophen  500 mg Oral Q6H   aspirin EC  325 mg Oral Q breakfast   donepezil  10 mg Oral QHS   Ferrous Fumarate  106 mg of iron Oral Daily   levothyroxine  112 mcg Oral Daily   losartan  100 mg Oral Daily   metoprolol succinate  100 mg Oral Daily   traMADol  50 mg Oral Q6H   traZODone  100 mg Oral QHS   Continuous Infusions:  sodium chloride 10 mL/hr at 10/15/22 2053   methocarbamol (ROBAXIN) IV     PRN Meds:.fentaNYL (SUBLIMAZE) injection, hydrALAZINE, menthol-cetylpyridinium **OR** phenol, methocarbamol **OR** methocarbamol (ROBAXIN) IV, metoCLOPramide **OR** metoCLOPramide (REGLAN) injection, ondansetron **OR** ondansetron (ZOFRAN) IV, oxyCODONE, polyethylene glycol   Anti-infectives (From admission, onward)    Start     Dose/Rate Route Frequency Ordered Stop   10/14/22 1830  ceFAZolin (ANCEF) IVPB 2g/100 mL premix        2 g 200 mL/hr over 30 Minutes Intravenous Every  6 hours 10/14/22 1744 10/15/22 0026   10/14/22 1500  ceFAZolin (ANCEF) IVPB 2g/100 mL premix        2 g 200 mL/hr over 30 Minutes Intravenous On call to O.R. 10/14/22 1411 10/14/22 1549      Subjective: Mahari Gartner today has no fevers, no emesis,  No chest pain,   -Resting comfortably, daughter Archie Patten and son-in-law Freida Busman at bedside -Able to get up with walker with assistance -Had BM  Objective: Vitals:   10/15/22 1948 10/16/22 0147 10/16/22 0513 10/16/22 1309  BP: (!) 157/52 (!) 157/55 (!) 192/70 (!) 164/51  Pulse: (!) 54 (!) 52 (!) 59   Resp: 20 20 20 17   Temp: (!) 97.5 F (36.4 C) 97.7 F  (36.5 C) 97.7 F (36.5 C) 98.6 F (37 C)  TempSrc: Oral Oral Axillary   SpO2: 94% 92% 92% 95%  Weight:      Height:        Intake/Output Summary (Last 24 hours) at 10/16/2022 1734 Last data filed at 10/16/2022 1300 Gross per 24 hour  Intake 480 ml  Output --  Net 480 ml   Filed Weights   10/14/22 0631  Weight: 54.4 kg   Physical Exam  Gen:- Awake Alert,  in no apparent distress  HEENT:- [place].AT, No sclera icterus Neck-Supple Neck,No JVD,.  Lungs-  CTAB , fair symmetrical air movement CV- S1, S2 normal, regular  Abd-  +ve B.Sounds, Abd Soft, No tenderness,    Extremity/Skin:- No  edema, pedal pulses present  Psych-affect is appropriate, oriented x3, underlying/baseline mild memory and cognitive deficits Neuro--weakness, no new focal deficits, no tremors MSK-left hip postop wound clean dry and intact  Data Reviewed: I have personally reviewed following labs and imaging studies  CBC: Recent Labs  Lab 10/14/22 0358 10/15/22 0426  WBC 2.2* 5.9  NEUTROABS 1.3*  --   HGB 12.5 10.6*  HCT 38.9 33.2*  MCV 88.8 90.5  PLT 77* 81*   Basic Metabolic Panel: Recent Labs  Lab 10/14/22 0358 10/15/22 0426  NA 138 136  K 3.6 3.5  CL 104 105  CO2 24 24  GLUCOSE 110* 126*  BUN 15 23  CREATININE 0.68 0.92  CALCIUM 8.8* 8.1*  MG 1.9  --   PHOS 3.1  --    GFR: Estimated Creatinine Clearance: 36.2 mL/min (by C-G formula based on SCr of 0.92 mg/dL). Liver Function Tests: Recent Labs  Lab 10/15/22 0426  AST 50*  ALT 32  ALKPHOS 65  BILITOT 0.9  PROT 5.3*  ALBUMIN 2.8*   Radiology Studies: DG HIP UNILAT WITH PELVIS 2-3 VIEWS LEFT  Result Date: 10/14/2022 CLINICAL DATA:  Postop left hip. EXAM: DG HIP (WITH OR WITHOUT PELVIS) 2-3V LEFT COMPARISON:  Preoperative radiograph FINDINGS: Left hip arthroplasty in expected alignment. No periprosthetic lucency or fracture. Recent postsurgical change includes air and edema in the soft tissues. IMPRESSION: Left hip arthroplasty  without immediate postoperative complication. Electronically Signed   By: Narda Rutherford M.D.   On: 10/14/2022 17:51    Scheduled Meds:  acetaminophen  500 mg Oral Q6H   aspirin EC  325 mg Oral Q breakfast   donepezil  10 mg Oral QHS   Ferrous Fumarate  106 mg of iron Oral Daily   levothyroxine  112 mcg Oral Daily   losartan  100 mg Oral Daily   metoprolol succinate  100 mg Oral Daily   traMADol  50 mg Oral Q6H   traZODone  100 mg Oral QHS  Continuous Infusions:  sodium chloride 10 mL/hr at 10/15/22 2053   methocarbamol (ROBAXIN) IV      LOS: 2 days   Shon Hale M.D on 10/16/2022 at 5:34 PM  Go to [email or web address] - for contact info  [facility] - Office  [phone]  If 7PM-7AM, please contact night-coverage [email or web address] 10/16/2022, 5:34 PM

## 2022-10-16 NOTE — Progress Notes (Signed)
Physical Therapy Treatment Patient Details Name: Lauren Lawrence MRN: [id number] DOB: 05/26/1940 Today's Date: 10/16/2022   History of Present Illness Lauren Lawrence is a 82 y.o. female with medical history significant of hypertension, hypothyroidism, depression, dementia who presents to the emergency department via EMS from home due to a fall at home.  Patient was not sure how she fell (possibly due to history of dementia), history was obtained from ED physician and ED medical record.  Per report, patient lives with daughter, daughter heard a sound and checked on patient and she was found on the ground moaning with pain, it was thought that she fell when she got up to go to the bathroom.  EMS was activated and patient was taken to the ED for further evaluation and management. S/P ARTHROPLASTY BIPOLAR HIP (HEMIARTHROPLASTY) (Left) 10/14/22.    PT Comments    Patient presents seated in chair (assisted by nursing staff) and agreeable for therapy.  Patient demonstrates fair/good return for completing BLE ROM/strengthening exercises, able to take a few side steps at bedside with slow labored movement, increased pain left hip and limited mostly due to having episode of diarrhea.  Patient cleaned up and tolerated staying up in chair with family members present after therapy.  Patient will benefit from continued skilled physical therapy in hospital and recommended venue below to increase strength, balance, endurance for safe ADLs and gait.     Recommendations for follow up therapy are one component of a multi-disciplinary discharge planning process, led by the attending physician.  Recommendations may be updated based on patient status, additional functional criteria and insurance authorization.  Follow Up Recommendations  Can patient physically be transported by private vehicle: No    Assistance Recommended at Discharge Intermittent Supervision/Assistance  Patient can return home with the following A lot of  help with walking and/or transfers;A lot of help with bathing/dressing/bathroom;Assistance with cooking/housework;Assist for transportation;Help with stairs or ramp for entrance   Equipment Recommendations  Rolling walker (2 wheels)    Recommendations for Other Services       Precautions / Restrictions Precautions Precautions: Fall Restrictions Weight Bearing Restrictions: Yes LLE Weight Bearing: Weight bearing as tolerated     Mobility  Bed Mobility               General bed mobility comments: Patient presents seated in chair (assisted by nursing staff)    Transfers Overall transfer level: Needs assistance Equipment used: Rolling walker (2 wheels) Transfers: Sit to/from Stand, Bed to chair/wheelchair/BSC Sit to Stand: Min assist, Mod assist   Step pivot transfers: Min assist, Mod assist       General transfer comment: slow labored unsteady movement with c/o increased pain left hip    Ambulation/Gait Ambulation/Gait assistance: Mod assist Gait Distance (Feet): 5 Feet Assistive device: Rolling walker (2 wheels) Gait Pattern/deviations: Step-to pattern, Decreased step length - right, Decreased step length - left, Decreased stance time - left, Decreased stride length, Decreased weight shift to left, Antalgic Gait velocity: decreased     General Gait Details: limited to a few slow labored side steps before having to sit due to c/o fatigue and left hip pain   Stairs             Wheelchair Mobility    Modified Rankin (Stroke Patients Only)       Balance Overall balance assessment: Needs assistance Sitting-balance support: Feet supported, No upper extremity supported Sitting balance-Leahy Scale: Fair Sitting balance - Comments: seated at edge of chair  Standing balance support: During functional activity, Reliant on assistive device for balance, Bilateral upper extremity supported Standing balance-Leahy Scale: Poor Standing balance comment:  fair/poor with RW                            Cognition Arousal/Alertness: Awake/alert Behavior During Therapy: WFL for tasks assessed/performed Overall Cognitive Status: Within Functional Limits for tasks assessed                                          Exercises General Exercises - Lower Extremity Ankle Circles/Pumps: Seated, AROM, Strengthening, Both, 15 reps Long Arc Quad: Seated, AROM, Strengthening, Both, 10 reps Hip Flexion/Marching: Seated, AROM, Strengthening, Both, 5 reps    General Comments        Pertinent Vitals/Pain Pain Assessment Pain Assessment: Faces Faces Pain Scale: Hurts little more Pain Location: left hip with movement Pain Descriptors / Indicators: Sore, Discomfort Pain Intervention(s): Limited activity within patient's tolerance, Monitored during session, Premedicated before session, Repositioned    Home Living                          Prior Function            PT Goals (current goals can now be found in the care plan section) Acute Rehab PT Goals Patient Stated Goal: Go home after rehab PT Goal Formulation: With patient/family Time For Goal Achievement: 10/29/22 Potential to Achieve Goals: Good Progress towards PT goals: Progressing toward goals    Frequency    Min 3X/week      PT Plan Current plan remains appropriate    Co-evaluation              AM-PAC PT "6 Clicks" Mobility   Outcome Measure  Help needed turning from your back to your side while in a flat bed without using bedrails?: A Little Help needed moving from lying on your back to sitting on the side of a flat bed without using bedrails?: A Little Help needed moving to and from a bed to a chair (including a wheelchair)?: A Lot Help needed standing up from a chair using your arms (e.g., wheelchair or bedside chair)?: A Lot Help needed to walk in hospital room?: A Lot Help needed climbing 3-5 steps with a railing? : A Lot 6  Click Score: 14    End of Session   Activity Tolerance: Patient tolerated treatment well;Patient limited by fatigue;Other (comment) (diarrhea) Patient left: in chair;with call bell/phone within reach;with family/visitor present Nurse Communication: Mobility status PT Visit Diagnosis: Unsteadiness on feet (R26.81);Other abnormalities of gait and mobility (R26.89);Muscle weakness (generalized) (M62.81)     Time: 1610-9604 PT Time Calculation (min) (ACUTE ONLY): 30 min  Charges:  $Therapeutic Exercise: 8-22 mins $Therapeutic Activity: 8-22 mins                     1:26 PM, 10/16/22 Ocie Bob, MPT Physical Therapist with [facility] [phone] office [phone] mobile phone

## 2022-10-16 NOTE — TOC Progression Note (Signed)
Transition of Care [facility]) - Progression Note    Patient Details  Name: Lauren Lawrence MRN: [id number] Date of Birth: [birth year]  [facility] of [facility]) CM/SW Contact  Larrie Kass, LCSW Phone Number: 10/16/2022, 9:54 AM  Clinical Narrative:    Pt's insurance [employer] was approved, however per TOC note on 6/28, facility can not admit over weekend , will d/c Monday morning. TOC to follow .   Expected Discharge Plan: Skilled Nursing Facility Barriers to Discharge: Continued Medical Work up  Expected Discharge Plan and Services     Post Acute Care Choice: Skilled Nursing Facility Living arrangements for the past 2 months: Single Family Home Expected Discharge Date: 10/16/22                                     Social Determinants of Health (SDOH) Interventions SDOH Screenings   Food Insecurity: No Food Insecurity (10/14/2022)  Housing: Low Risk  (10/14/2022)  Transportation Needs: No Transportation Needs (10/14/2022)  Utilities: Not At Risk (10/14/2022)  Alcohol Screen: Low Risk  (05/14/2022)  Depression (PHQ2-9): High Risk (08/16/2022)  Financial Resource Strain: Low Risk  (07/05/2022)  Physical Activity: Insufficiently Active (07/05/2022)  Social Connections: Unknown (07/05/2022)  Stress: No Stress Concern Present (07/05/2022)  Tobacco Use: Low Risk  (10/14/2022)    Readmission Risk Interventions     No data to display

## 2022-10-17 DIAGNOSIS — S72002A Fracture of unspecified part of neck of left femur, initial encounter for closed fracture: Secondary | ICD-10-CM | POA: Diagnosis not present

## 2022-10-17 LAB — BASIC METABOLIC PANEL
Anion gap: 7 (ref 5–15)
BUN: 29 mg/dL — ABNORMAL HIGH (ref 8–23)
CO2: 22 mmol/L (ref 22–32)
Calcium: 8 mg/dL — ABNORMAL LOW (ref 8.9–10.3)
Chloride: 106 mmol/L (ref 98–111)
Creatinine, Ser: 0.79 mg/dL (ref 0.44–1.00)
GFR, Estimated: >60 mL/min (ref >60)
Glucose, Bld: 81 mg/dL (ref 70–99)
Potassium: 3.8 mmol/L (ref 3.5–5.1)
Sodium: 135 mmol/L (ref 135–145)

## 2022-10-17 LAB — CBC
HCT: 31.4 % — ABNORMAL LOW (ref 36.0–46.0)
Hemoglobin: 9.8 g/dL — ABNORMAL LOW (ref 12.0–15.0)
MCH: 28.7 pg (ref 26.0–34.0)
MCHC: 31.2 g/dL (ref 30.0–36.0)
MCV: 91.8 fL (ref 80.0–100.0)
Platelets: 62 10*3/uL — ABNORMAL LOW (ref 150–400)
RBC: 3.42 MIL/uL — ABNORMAL LOW (ref 3.87–5.11)
RDW: 21.1 % — ABNORMAL HIGH (ref 11.5–15.5)
WBC: 2.7 10*3/uL — ABNORMAL LOW (ref 4.0–10.5)
nRBC: 0 % (ref 0.0–0.2)

## 2022-10-17 MED ORDER — LOPERAMIDE HCL 2 MG PO CAPS
2.0000 mg | ORAL_CAPSULE | ORAL | Status: DC | PRN
  Administered 2022-10-17 – 2022-10-18 (×2): 2 mg via ORAL
  Filled 2022-10-17 (×2): qty 1

## 2022-10-17 MED ORDER — ASPIRIN 81 MG PO TBEC
81.0000 mg | DELAYED_RELEASE_TABLET | Freq: Two times a day (BID) | ORAL | Status: DC
  Administered 2022-10-17 – 2022-10-19 (×4): 81 mg via ORAL
  Filled 2022-10-17 (×4): qty 1

## 2022-10-17 NOTE — Progress Notes (Signed)
PROGRESS NOTE   Lauren Lawrence, is a 82 y.o. female, DOB - Nov 13, 1940, QMG:[id number]  Admit date - 10/14/2022   Admitting Physician Lauren Shown, DO  Outpatient Primary MD for the patient is Lauren Lawrence, Lauren Isaacs, MD  LOS - 3  Chief Complaint  Patient presents with   Fall        Brief Summary:  82 y.o. female with medical history significant of hypertension, hypothyroidism, depression, dementia admitted with left hip fracture after a fall at home  -As of 10/17/2022 patient is medically stable for transfer to SNF rehab when a bed can be found and when insurance authorization is obtained   A/p 1)Left hip fracture/status post mechanical fall at home -s/p bipolar hip replacement on 10/14/22 -Further management per orthopedic team Weightbearing as tolerated Direct lateral hip precautions DVT prophylaxis for 30 days-- --remove staples at 12 to 14 days Postop ortho appointment scheduled for 28 days -PT eval appreciated recommends SNF rehab 10/17/22 -Pancytopenia noted with platelet count..  Down to 15 K -Okay to use aspirin 81 mg twice daily for DVT prophylaxis and watch platelets closely   2)HTN--stable, continue Toprol-XL 100 mg daily along with losartan 100 mg daily -IV labetalol as needed   3)Chronic Leukopenia--- WBC currently less than 3 which is baseline  4)Hypothyroidism--continue levothyroxine 112 mcg daily   5)Chronic Thrombocytopenia- -platelets trending down, currently down to 62 K, see #1 above --Monitor closely anticipate some drop preoperatively   6)Dementia--stable, continue Aricept  7) chronic anemia--Hgb currently above 10 which is around patient's baseline watch closely postoperatively especially while on  DVT prophylaxis  Status is: Inpatient   Disposition: The patient is from: Home              Anticipated d/c is to: SNF              Anticipated d/c date is: 1 day              Patient currently is medically stable to d/c.--  Barriers:-As of 6/309/2024  patient is medically stable for transfer to SNF rehab when a bed can be found and when insurance authorization is obtained  Code Status :  -  Code Status: Full Code   Family Communication:   Discussed with daughters Lauren Lawrence and Lauren Lawrence and son in Physiological scientist  DVT Prophylaxis  :   - SCDs  SCDs Start: 10/14/22 1745 Place TED hose Start: 10/14/22 1745 SCDs Start: 10/14/22 0606   Lab Results  Component Value Date   PLT 62 (L) 10/17/2022    Inpatient Medications  Scheduled Meds:  acetaminophen  500 mg Oral Q6H   aspirin EC  81 mg Oral BID WC   donepezil  10 mg Oral QHS   Ferrous Fumarate  106 mg of iron Oral Daily   levothyroxine  112 mcg Oral Daily   losartan  100 mg Oral Daily   metoprolol succinate  100 mg Oral Daily   traMADol  50 mg Oral Q6H   traZODone  100 mg Oral QHS   Continuous Infusions:  sodium chloride 10 mL/hr at 10/15/22 2053   methocarbamol (ROBAXIN) IV     PRN Meds:.fentaNYL (SUBLIMAZE) injection, hydrALAZINE, loperamide, menthol-cetylpyridinium **OR** phenol, methocarbamol **OR** methocarbamol (ROBAXIN) IV, metoCLOPramide **OR** metoCLOPramide (REGLAN) injection, ondansetron **OR** ondansetron (ZOFRAN) IV, oxyCODONE   Anti-infectives (From admission, onward)    Start     Dose/Rate Route Frequency Ordered Stop   10/14/22 1830  ceFAZolin (ANCEF) IVPB 2g/100 mL premix  2 g 200 mL/hr over 30 Minutes Intravenous Every 6 hours 10/14/22 1744 10/15/22 0026   10/14/22 1500  ceFAZolin (ANCEF) IVPB 2g/100 mL premix        2 g 200 mL/hr over 30 Minutes Intravenous On call to O.R. 10/14/22 1411 10/14/22 1549      Subjective: Lauren Lawrence today has no fevers, no emesis,  No chest pain,    -Diarrhea improving -Ambulating with assistance, daughters Lauren Lawrence and Lauren Lawrence at bedside  Objective: Vitals:   10/16/22 1309 10/16/22 2010 10/17/22 0544 10/17/22 1449  BP: (!) 164/51 (!) 139/47 (!) 149/60 (!) 140/48  Pulse:  (!) 54 61 (!) 49  Resp: 17 18 18 17   Temp: 98.6 F  (37 C) 98.2 F (36.8 C) 98.6 F (37 C) 98.1 F (36.7 C)  TempSrc:  Oral Oral Oral  SpO2: 95% 92% 92% 94%  Weight:      Height:        Intake/Output Summary (Last 24 hours) at 10/17/2022 1753 Last data filed at 10/16/2022 1829 Gross per 24 hour  Intake 240 ml  Output --  Net 240 ml   Filed Weights   10/14/22 0631  Weight: 54.4 kg   Physical Exam  Gen:- Awake Alert,  in no apparent distress  HEENT:- [place].AT, No sclera icterus Neck-Supple Neck,No JVD,.  Lungs-  CTAB , fair symmetrical air movement CV- S1, S2 normal, regular  Abd-  +ve B.Sounds, Abd Soft, No tenderness,    Extremity/Skin:- No  edema, pedal pulses present  Psych-affect is appropriate, oriented x3, underlying/baseline mild memory and cognitive deficits Neuro--weakness, no new focal deficits, no tremors MSK-left hip postop wound clean dry and intact  Data Reviewed: I have personally reviewed following labs and imaging studies  CBC: Recent Labs  Lab 10/14/22 0358 10/15/22 0426 10/17/22 0403  WBC 2.2* 5.9 2.7*  NEUTROABS 1.3*  --   --   HGB 12.5 10.6* 9.8*  HCT 38.9 33.2* 31.4*  MCV 88.8 90.5 91.8  PLT 77* 81* 62*   Basic Metabolic Panel: Recent Labs  Lab 10/14/22 0358 10/15/22 0426 10/17/22 0403  NA 138 136 135  K 3.6 3.5 3.8  CL 104 105 106  CO2 24 24 22   GLUCOSE 110* 126* 81  BUN 15 23 29*  CREATININE 0.68 0.92 0.79  CALCIUM 8.8* 8.1* 8.0*  MG 1.9  --   --   PHOS 3.1  --   --    GFR: Estimated Creatinine Clearance: 41.6 mL/min (by C-G formula based on SCr of 0.79 mg/dL). Liver Function Tests: Recent Labs  Lab 10/15/22 0426  AST 50*  ALT 32  ALKPHOS 65  BILITOT 0.9  PROT 5.3*  ALBUMIN 2.8*   Radiology Studies: No results found.  Scheduled Meds:  acetaminophen  500 mg Oral Q6H   aspirin EC  81 mg Oral BID WC   donepezil  10 mg Oral QHS   Ferrous Fumarate  106 mg of iron Oral Daily   levothyroxine  112 mcg Oral Daily   losartan  100 mg Oral Daily   metoprolol succinate  100  mg Oral Daily   traMADol  50 mg Oral Q6H   traZODone  100 mg Oral QHS   Continuous Infusions:  sodium chloride 10 mL/hr at 10/15/22 2053   methocarbamol (ROBAXIN) IV      LOS: 3 days   Lauren Lawrence M.D on 10/17/2022 at 5:53 PM  Go to [email or web address] - for contact info  [facility] - Office  [phone]  If 7PM-7AM, please contact night-coverage [email or web address] 10/17/2022, 5:53 PM

## 2022-10-17 NOTE — Progress Notes (Signed)
Subjective: 3 Days Post-Op Procedure(s) (LRB): ARTHROPLASTY BIPOLAR HIP (HEMIARTHROPLASTY) (Left)  C/o diarrhea  Objective: Vital signs in last 24 hours: Temp:  [98.2 F (36.8 C)-98.6 F (37 C)] 98.6 F (37 C) (06/30 0544) Pulse Rate:  [54-61] 61 (06/30 0544) Resp:  [17-18] 18 (06/30 0544) BP: (139-164)/(47-60) 149/60 (06/30 0544) SpO2:  [92 %-95 %] 92 % (06/30 0544)  Intake/Output from previous day: 06/29 0701 - 06/30 0700 In: 720 [P.O.:720] Out: -  Intake/Output this shift: No intake/output data recorded.  Recent Labs    10/15/22 0426 10/17/22 0403  HGB 10.6* 9.8*   Recent Labs    10/15/22 0426 10/17/22 0403  WBC 5.9 2.7*  RBC 3.67* 3.42*  HCT 33.2* 31.4*  PLT 81* 62*   Recent Labs    10/15/22 0426 10/17/22 0403  NA 136 135  K 3.5 3.8  CL 105 106  CO2 24 22  BUN 23 29*  CREATININE 0.92 0.79  GLUCOSE 126* 81  CALCIUM 8.1* 8.0*    Assessment/Plan: 3 Days Post-Op Procedure(s) (LRB): ARTHROPLASTY BIPOLAR HIP (HEMIARTHROPLASTY) (Left)  Stool softeners were stopped    Immodium?  Monitor for deh2o and hypokalemia   Transfers Overall transfer level: Needs assistance Equipment used: Rolling walker (2 wheels) Transfers: Sit to/from Stand, Bed to chair/wheelchair/BSC Sit to Stand: Min assist, Mod assist Step pivot transfers: Min assist, Mod assist General transfer comment: slow labored unsteady movement with c/o increased pain left hip   Ambulation/Gait Ambulation/Gait assistance: Mod assist Gait Distance (Feet): 5 Feet Assistive device: Rolling walker (2 wheels) Gait Pattern/deviations: Step-to pattern, Decreased step length - right, Decreased step length - left, Decreased stance time - left, Decreased stride length, Decreased weight shift to left, Antalgic Gait velocity: decreased General Gait Details: limited to a few slow labored side steps before having to sit due to c/o fatigue and left hip pain   Postop plan   Weightbearing as  tolerated Direct lateral hip precautions DVT prophylaxis for 30 days Postop appointment scheduled for about 14 days   [id number]  Fuller [place] 10/17/2022, 10:13 AM

## 2022-10-17 NOTE — Progress Notes (Signed)
Alert and oriented x 3, did not know year and month.  Dressing to left hip dry and intact and strong dorsalis pedis pulse.  Has been having diarrhea and just received immodium prn.  Daughters have been at bedside and assisting to walk to bathroom.  Sitting in chair now

## 2022-10-17 NOTE — Progress Notes (Signed)
Nightshift report: Pt was calm and cooperative, A&O x 2. Pain controlled with scheduled pain meds. She had a watery bowel movement at the [facility] in the evening before bed with no more incidences over night. She ambulated from chair to [facility] to bed with walker and 2 assist. Trazadone was not given due to the patient already sleeping. The patient's daughter remained at bedside all night and she stated that the patient slept all night.

## 2022-10-18 ENCOUNTER — Encounter (HOSPITAL_COMMUNITY): Payer: Self-pay | Admitting: Orthopedic Surgery

## 2022-10-18 DIAGNOSIS — S72002A Fracture of unspecified part of neck of left femur, initial encounter for closed fracture: Secondary | ICD-10-CM | POA: Diagnosis not present

## 2022-10-18 LAB — CBC
HCT: 31.2 % — ABNORMAL LOW (ref 36.0–46.0)
Hemoglobin: 9.9 g/dL — ABNORMAL LOW (ref 12.0–15.0)
MCH: 28.8 pg (ref 26.0–34.0)
MCHC: 31.7 g/dL (ref 30.0–36.0)
MCV: 90.7 fL (ref 80.0–100.0)
Platelets: 81 10*3/uL — ABNORMAL LOW (ref 150–400)
RBC: 3.44 MIL/uL — ABNORMAL LOW (ref 3.87–5.11)
RDW: 21.2 % — ABNORMAL HIGH (ref 11.5–15.5)
WBC: 3.1 10*3/uL — ABNORMAL LOW (ref 4.0–10.5)
nRBC: 0 % (ref 0.0–0.2)

## 2022-10-18 LAB — BPAM RBC
Blood Product Expiration Date: 202407232359
Unit Type and Rh: 6200

## 2022-10-18 LAB — TYPE AND SCREEN
Antibody Screen: NEGATIVE
Unit division: 0

## 2022-10-18 NOTE — Progress Notes (Signed)
Physical Therapy Treatment Patient Details Name: Lauren Lawrence MRN: [id number] DOB: 1941/03/01 Today's Date: 10/18/2022   History of Present Illness Lauren Lawrence is a 82 y.o. female with medical history significant of hypertension, hypothyroidism, depression, dementia who presents to the emergency department via EMS from home due to a fall at home.  Patient was not sure how she fell (possibly due to history of dementia), history was obtained from ED physician and ED medical record.  Per report, patient lives with daughter, daughter heard a sound and checked on patient and she was found on the ground moaning with pain, it was thought that she fell when she got up to go to the bathroom.  EMS was activated and patient was taken to the ED for further evaluation and management. S/P ARTHROPLASTY BIPOLAR HIP (HEMIARTHROPLASTY) (Left) 10/14/22.    PT Comments  Patient demonstrates increased endurance/distance for gait training with slow labored cadence with fair return for left heel to toe stepping without loss of balanceand limited mostly due to fatigue and increasing left hip pain.  Patient demonstrates fair/good return for supine to sitting requiring Min assist for propping up on elbows to hands and moving LLE.  Patient tolerated staying up in chair with her daughter present after therapy.  Patient will benefit from continued skilled physical therapy in hospital and recommended venue below to increase strength, balance, endurance for safe ADLs and gait.      Assistance Recommended at Discharge Intermittent Supervision/Assistance  If plan is discharge home, recommend the following:  Can travel by private vehicle    A lot of help with walking and/or transfers;A lot of help with bathing/dressing/bathroom;Assistance with cooking/housework;Assist for transportation;Help with stairs or ramp for entrance   Yes  Equipment Recommendations  Rolling walker (2 wheels)    Recommendations for Other Services        Precautions / Restrictions Precautions Precautions: Fall Restrictions Weight Bearing Restrictions: Yes LLE Weight Bearing: Weight bearing as tolerated     Mobility  Bed Mobility Overal bed mobility: Needs Assistance Bed Mobility: Supine to Sit, Sit to Supine     Supine to sit: Min assist Sit to supine: Min assist   General bed mobility comments: required assistance for moving LLE and propping up onto elbows to hands    Transfers Overall transfer level: Needs assistance Equipment used: Rolling walker (2 wheels) Transfers: Sit to/from Stand, Bed to chair/wheelchair/BSC Sit to Stand: Min assist   Step pivot transfers: Min assist, Mod assist       General transfer comment: demonstrates increased LLE strength for completing sit to stands, transfers    Ambulation/Gait Ambulation/Gait assistance: Min assist, Mod assist Gait Distance (Feet): 30 Feet Assistive device: Rolling walker (2 wheels) Gait Pattern/deviations: Step-to pattern, Decreased step length - right, Decreased step length - left, Decreased stance time - left, Decreased stride length, Decreased weight shift to left, Antalgic Gait velocity: decreased     General Gait Details: increased endurance/distance for gait training demonstrating slow labored cadence with fair return for left heel to toe stepping without loss of balance, limited mostly due to fatigue and increasing left hip pain   Stairs             Wheelchair Mobility     Tilt Bed    Modified Rankin (Stroke Patients Only)       Balance Overall balance assessment: Needs assistance Sitting-balance support: Feet supported, No upper extremity supported Sitting balance-Leahy Scale: Fair Sitting balance - Comments: fair/good seated at EOB  Standing balance support: Reliant on assistive device for balance, During functional activity, Bilateral upper extremity supported Standing balance-Leahy Scale: Fair Standing balance comment: using RW                             Cognition Arousal/Alertness: Awake/alert Behavior During Therapy: WFL for tasks assessed/performed Overall Cognitive Status: Within Functional Limits for tasks assessed                                          Exercises General Exercises - Lower Extremity Long Arc Quad: Seated, AROM, Strengthening, Both, 10 reps Hip Flexion/Marching: Seated, AROM, Both, 10 reps Toe Raises: Seated, AROM, Strengthening, Both, 10 reps Heel Raises: Seated, AROM, Strengthening, Both, 10 reps    General Comments        Pertinent Vitals/Pain Pain Assessment Pain Assessment: Faces Faces Pain Scale: Hurts a little bit Pain Location: left hip with movement Pain Descriptors / Indicators: Sore, Discomfort Pain Intervention(s): Limited activity within patient's tolerance, Monitored during session, Repositioned    Home Living                          Prior Function            PT Goals (current goals can now be found in the care plan section) Acute Rehab PT Goals Patient Stated Goal: Go home after rehab PT Goal Formulation: With patient/family Time For Goal Achievement: 10/29/22 Potential to Achieve Goals: Good Progress towards PT goals: Progressing toward goals    Frequency    Min 3X/week      PT Plan Current plan remains appropriate    Co-evaluation              AM-PAC PT "6 Clicks" Mobility   Outcome Measure  Help needed turning from your back to your side while in a flat bed without using bedrails?: A Little Help needed moving from lying on your back to sitting on the side of a flat bed without using bedrails?: A Little Help needed moving to and from a bed to a chair (including a wheelchair)?: A Little Help needed standing up from a chair using your arms (e.g., wheelchair or bedside chair)?: A Little Help needed to walk in hospital room?: A Lot Help needed climbing 3-5 steps with a railing? : A Lot 6 Click  Score: 16    End of Session   Activity Tolerance: Patient tolerated treatment well;Patient limited by fatigue Patient left: in chair;with call bell/phone within reach;with family/visitor present Nurse Communication: Mobility status PT Visit Diagnosis: Unsteadiness on feet (R26.81);Other abnormalities of gait and mobility (R26.89);Muscle weakness (generalized) (M62.81)     Time: 1610-9604 PT Time Calculation (min) (ACUTE ONLY): 33 min  Charges:    $Gait Training: 8-22 mins $Therapeutic Exercise: 8-22 mins PT General Charges $$ ACUTE PT VISIT: 1 Visit                     2:05 PM, 10/18/22 Ocie Bob, MPT Physical Therapist with [facility] [phone] office [phone] mobile phone

## 2022-10-18 NOTE — TOC Progression Note (Addendum)
Transition of Care [facility]) - Progression Note    Patient Details  Name: Lauren Lawrence MRN: [id number] Date of Birth: [birth year]  [facility]) CM/SW Contact  Elliot Gault, LCSW Phone Number: 10/18/2022, 11:33 AM  Clinical Narrative:     TOC following. Spoke with pt/dtr regarding dc planning. They are requesting [facility] for SNF rehab now. Per Tiffany at [facility], they will have a bed for pt tomorrow. Updated MD.  [employer] CMA updating insurance.  EMS scheduled for 1230 tomorrow.  Expected Discharge Plan: Skilled Nursing Facility Barriers to Discharge: Continued Medical Work up  Expected Discharge Plan and Services     Post Acute Care Choice: Skilled Nursing Facility Living arrangements for the past 2 months: Single Family Home Expected Discharge Date: 10/18/22                                     Social Determinants of Health (SDOH) Interventions SDOH Screenings   Food Insecurity: No Food Insecurity (10/14/2022)  Housing: Low Risk  (10/14/2022)  Transportation Needs: No Transportation Needs (10/14/2022)  Utilities: Not At Risk (10/14/2022)  Alcohol Screen: Low Risk  (05/14/2022)  Depression (PHQ2-9): High Risk (08/16/2022)  Financial Resource Strain: Low Risk  (07/05/2022)  Physical Activity: Insufficiently Active (07/05/2022)  Social Connections: Unknown (07/05/2022)  Stress: No Stress Concern Present (07/05/2022)  Tobacco Use: Low Risk  (10/14/2022)    Readmission Risk Interventions     No data to display

## 2022-10-18 NOTE — Care Management Important Message (Signed)
Important Message  Patient Details  Name: Lauren Lawrence MRN: [id number] Date of Birth: 1941/01/05   Medicare Important Message Given:  Yes     Corey Harold 10/18/2022, 10:40 AM

## 2022-10-18 NOTE — Progress Notes (Signed)
PROGRESS NOTE   Lauren Lawrence, is a 82 y.o. female, DOB - Jan 22, 1941, ZHY:[id number]  Admit date - 10/14/2022   Admitting Physician Frankey Shown, DO  Outpatient Primary MD for the patient is Fisher, Demetrios Isaacs, MD  LOS - 4  Chief Complaint  Patient presents with   Fall       Brief Summary:  82 y.o. female with medical history significant of hypertension, hypothyroidism, depression, dementia admitted with left hip fracture after a fall at home -As of 10/18/2022 patient remains medically stable for transfer to SNF rehab when a bed can be found and when insurance authorization is obtained   A/p 1)Left hip fracture/status post mechanical fall at home -s/p bipolar hip replacement on 10/14/22 -Further management per orthopedic team Weightbearing as tolerated Direct lateral hip precautions DVT prophylaxis for 30 days-- --remove staples at 12 to 14 days Postop ortho appointment scheduled for 28 days -PT eval appreciated recommends SNF rehab 10/18/22 -Pancytopenia noted with platelet count is 81 K (longstanding history of thrombocytopenia and leukopenia--please see CBC from [facility] dated 01/22/2014 with platelets of 85 k and WBC of 3.1 at that time) -Okay to use aspirin 81 mg twice daily for DVT prophylaxis and watch platelets closely   2)HTN--stable, continue Toprol-XL 100 mg daily along with losartan 100 mg daily -IV labetalol as needed   3)Chronic Leukopenia--- WBC currently ids around  3 which is baseline  4)Hypothyroidism--continue levothyroxine 112 mcg daily   5)Chronic Thrombocytopenia- -platelets trending down, currently down to 62 K, see #1 above --Monitor closely anticipate some drop preoperatively   6)Dementia--stable, continue Aricept  7) chronic anemia--Hgb currently around 10 which is around patient's baseline watch closely postoperatively especially while on  DVT prophylaxis  Status is: Inpatient   Disposition: The patient is from: Home               Anticipated d/c is to: SNF              Anticipated d/c date is: 1 day              Patient currently is medically stable to d/c.--  Barriers:--As of 10/18/2022 patient remains medically stable for transfer to SNF rehab when a bed can be found and when insurance authorization is obtained  Code Status :  -  Code Status: Full Code   Family Communication:   Discussed with daughters Kenney Houseman and Rosey Bath and son in Physiological scientist  DVT Prophylaxis  :   - SCDs  SCDs Start: 10/14/22 1745 Place TED hose Start: 10/14/22 1745 SCDs Start: 10/14/22 0606   Lab Results  Component Value Date   PLT 81 (L) 10/18/2022   Inpatient Medications  Scheduled Meds:  acetaminophen  500 mg Oral Q6H   aspirin EC  81 mg Oral BID WC   donepezil  10 mg Oral QHS   Ferrous Fumarate  106 mg of iron Oral Daily   levothyroxine  112 mcg Oral Daily   losartan  100 mg Oral Daily   metoprolol succinate  100 mg Oral Daily   traMADol  50 mg Oral Q6H   traZODone  100 mg Oral QHS   Continuous Infusions:  sodium chloride 10 mL/hr at 10/15/22 2053   methocarbamol (ROBAXIN) IV     PRN Meds:.fentaNYL (SUBLIMAZE) injection, hydrALAZINE, loperamide, menthol-cetylpyridinium **OR** phenol, methocarbamol **OR** methocarbamol (ROBAXIN) IV, metoCLOPramide **OR** metoCLOPramide (REGLAN) injection, ondansetron **OR** ondansetron (ZOFRAN) IV, oxyCODONE   Anti-infectives (From admission, onward)    Start  Dose/Rate Route Frequency Ordered Stop   10/14/22 1830  ceFAZolin (ANCEF) IVPB 2g/100 mL premix        2 g 200 mL/hr over 30 Minutes Intravenous Every 6 hours 10/14/22 1744 10/15/22 0026   10/14/22 1500  ceFAZolin (ANCEF) IVPB 2g/100 mL premix        2 g 200 mL/hr over 30 Minutes Intravenous On call to O.R. 10/14/22 1411 10/14/22 1549      Subjective: Kierstynn Cantero today has no fevers, no emesis,  No chest pain,    -Ambulating with assistance, -Appetite is not great -Daughter Kenney Houseman is at bedside --As of 10/18/2022 patient remains  medically stable for transfer to SNF rehab when a bed can be found and when insurance authorization is obtained  Objective: Vitals:   10/18/22 0510 10/18/22 0936 10/18/22 1411 10/18/22 1413  BP: (!) 147/51 (!) 144/45 (!) 161/45 (!) 153/48  Pulse: 66 (!) 55 (!) 54 (!) 52  Resp: 17 18 20 20   Temp: 98.7 F (37.1 C) 98.4 F (36.9 C) 97.7 F (36.5 C)   TempSrc: Oral Oral Oral   SpO2: 94% 94% 95% 95%  Weight:      Height:        Intake/Output Summary (Last 24 hours) at 10/18/2022 1602 Last data filed at 10/18/2022 1411 Gross per 24 hour  Intake 480 ml  Output --  Net 480 ml   Filed Weights   10/14/22 0631  Weight: 54.4 kg   Physical Exam  Gen:- Awake Alert,  in no apparent distress  HEENT:- Welcome.AT, No sclera icterus Neck-Supple Neck,No JVD,.  Lungs-  CTAB , fair symmetrical air movement CV- S1, S2 normal, regular  Abd-  +ve B.Sounds, Abd Soft, No tenderness,    Extremity/Skin:- No  edema, pedal pulses present  Psych-affect is appropriate, oriented x3, underlying/baseline mild memory and cognitive deficits Neuro--weakness, no new focal deficits, no tremors MSK-left hip postop wound clean dry and intact  Data Reviewed: I have personally reviewed following labs and imaging studies  CBC: Recent Labs  Lab 10/14/22 0358 10/15/22 0426 10/17/22 0403 10/18/22 0440  WBC 2.2* 5.9 2.7* 3.1*  NEUTROABS 1.3*  --   --   --   HGB 12.5 10.6* 9.8* 9.9*  HCT 38.9 33.2* 31.4* 31.2*  MCV 88.8 90.5 91.8 90.7  PLT 77* 81* 62* 81*   Basic Metabolic Panel: Recent Labs  Lab 10/14/22 0358 10/15/22 0426 10/17/22 0403  NA 138 136 135  K 3.6 3.5 3.8  CL 104 105 106  CO2 24 24 22   GLUCOSE 110* 126* 81  BUN 15 23 29*  CREATININE 0.68 0.92 0.79  CALCIUM 8.8* 8.1* 8.0*  MG 1.9  --   --   PHOS 3.1  --   --    GFR: Estimated Creatinine Clearance: 41.6 mL/min (by C-G formula based on SCr of 0.79 mg/dL). Liver Function Tests: Recent Labs  Lab 10/15/22 0426  AST 50*  ALT 32  ALKPHOS  65  BILITOT 0.9  PROT 5.3*  ALBUMIN 2.8*   Radiology Studies: No results found.  Scheduled Meds:  acetaminophen  500 mg Oral Q6H   aspirin EC  81 mg Oral BID WC   donepezil  10 mg Oral QHS   Ferrous Fumarate  106 mg of iron Oral Daily   levothyroxine  112 mcg Oral Daily   losartan  100 mg Oral Daily   metoprolol succinate  100 mg Oral Daily   traMADol  50 mg Oral Q6H  traZODone  100 mg Oral QHS   Continuous Infusions:  sodium chloride 10 mL/hr at 10/15/22 2053   methocarbamol (ROBAXIN) IV      LOS: 4 days   Shon Hale M.D on 10/18/2022 at 4:02 PM  Go to [email or web address] - for contact info  [facility] - Office  [phone]  If 7PM-7AM, please contact night-coverage [email or web address] 10/18/2022, 4:02 PM

## 2022-10-19 DIAGNOSIS — R601 Generalized edema: Secondary | ICD-10-CM | POA: Diagnosis not present

## 2022-10-19 DIAGNOSIS — D649 Anemia, unspecified: Secondary | ICD-10-CM | POA: Diagnosis not present

## 2022-10-19 DIAGNOSIS — S72002A Fracture of unspecified part of neck of left femur, initial encounter for closed fracture: Secondary | ICD-10-CM | POA: Diagnosis not present

## 2022-10-19 DIAGNOSIS — D696 Thrombocytopenia, unspecified: Secondary | ICD-10-CM | POA: Diagnosis not present

## 2022-10-19 DIAGNOSIS — S72002D Fracture of unspecified part of neck of left femur, subsequent encounter for closed fracture with routine healing: Secondary | ICD-10-CM | POA: Diagnosis not present

## 2022-10-19 DIAGNOSIS — E785 Hyperlipidemia, unspecified: Secondary | ICD-10-CM | POA: Diagnosis not present

## 2022-10-19 DIAGNOSIS — F32A Depression, unspecified: Secondary | ICD-10-CM | POA: Diagnosis not present

## 2022-10-19 DIAGNOSIS — D72819 Decreased white blood cell count, unspecified: Secondary | ICD-10-CM | POA: Diagnosis not present

## 2022-10-19 DIAGNOSIS — W19XXXD Unspecified fall, subsequent encounter: Secondary | ICD-10-CM | POA: Diagnosis not present

## 2022-10-19 DIAGNOSIS — E7800 Pure hypercholesterolemia, unspecified: Secondary | ICD-10-CM | POA: Diagnosis not present

## 2022-10-19 DIAGNOSIS — Z7401 Bed confinement status: Secondary | ICD-10-CM | POA: Diagnosis not present

## 2022-10-19 DIAGNOSIS — D509 Iron deficiency anemia, unspecified: Secondary | ICD-10-CM | POA: Diagnosis not present

## 2022-10-19 DIAGNOSIS — Z7982 Long term (current) use of aspirin: Secondary | ICD-10-CM | POA: Diagnosis not present

## 2022-10-19 DIAGNOSIS — F03A3 Unspecified dementia, mild, with mood disturbance: Secondary | ICD-10-CM | POA: Diagnosis not present

## 2022-10-19 DIAGNOSIS — I10 Essential (primary) hypertension: Secondary | ICD-10-CM | POA: Diagnosis not present

## 2022-10-19 DIAGNOSIS — G309 Alzheimer's disease, unspecified: Secondary | ICD-10-CM | POA: Diagnosis not present

## 2022-10-19 DIAGNOSIS — E039 Hypothyroidism, unspecified: Secondary | ICD-10-CM | POA: Diagnosis not present

## 2022-10-19 DIAGNOSIS — Z96641 Presence of right artificial hip joint: Secondary | ICD-10-CM | POA: Diagnosis not present

## 2022-10-19 MED ORDER — ACETAMINOPHEN 325 MG PO TABS: 650.0000 mg | ORAL_TABLET | Freq: Four times a day (QID) | ORAL | 0 refills | Status: DC | PRN

## 2022-10-19 MED ORDER — LOSARTAN POTASSIUM 100 MG PO TABS: 100.0000 mg | ORAL_TABLET | Freq: Every day | ORAL | 1 refills | Status: DC

## 2022-10-19 MED ORDER — METHOCARBAMOL 500 MG PO TABS: 500.0000 mg | ORAL_TABLET | Freq: Three times a day (TID) | ORAL | 0 refills | Status: AC

## 2022-10-19 MED ORDER — TRAMADOL HCL 50 MG PO TABS: 50.0000 mg | ORAL_TABLET | Freq: Four times a day (QID) | ORAL | 0 refills | Status: DC | PRN

## 2022-10-19 MED ORDER — IRON 325 (65 FE) MG PO TABS: 1.0000 | ORAL_TABLET | Freq: Every day | ORAL | 1 refills | Status: DC

## 2022-10-19 MED ORDER — ASPIRIN 81 MG PO TBEC: 81.0000 mg | DELAYED_RELEASE_TABLET | Freq: Two times a day (BID) | ORAL | 0 refills | Status: AC

## 2022-10-19 MED ORDER — LEVOTHYROXINE SODIUM 112 MCG PO TABS
112.0000 ug | ORAL_TABLET | Freq: Every day | ORAL | 3 refills | Status: DC
Start: 2022-10-19 — End: 2023-09-05

## 2022-10-19 MED ORDER — LOPERAMIDE HCL 2 MG PO CAPS: 2.0000 mg | ORAL_CAPSULE | ORAL | 0 refills | Status: DC | PRN

## 2022-10-19 MED ORDER — METOPROLOL SUCCINATE ER 100 MG PO TB24
100.0000 mg | ORAL_TABLET | Freq: Every day | ORAL | 2 refills | Status: DC
Start: 2022-10-19 — End: 2023-04-18

## 2022-10-19 NOTE — Discharge Instructions (Signed)
1)Okay to use aspirin 81 mg twice daily for DVT prophylaxis for up to 30 days and watch platelets closely, decrease aspirin back to 81 mg daily after 30 days  2) CBC every Friday for 4 weeks starting on Friday, 10/22/2022  3)Avoid ibuprofen/Advil/Aleve/Motrin/Goody Powders/Naproxen/BC powders/Meloxicam/Diclofenac/Indomethacin and other Nonsteroidal anti-inflammatory medications as these will make you more likely to bleed and can cause stomach ulcers, can also cause Kidney problems.  4)Remove staples at 12 to 14 days Postop orthopedic appointment scheduled for 28 days with -Dr. Fuller Canada, MD----Address: [address] [place], [place] [address], Phone: [phone]

## 2022-10-19 NOTE — TOC Transition Note (Signed)
Transition of Care [facility]) - CM/SW Discharge Note   Patient Details  Name: Lauren Lawrence MRN: [id number] Date of Birth: 1940-08-06  Transition of Care [facility]) CM/SW Contact:  Leitha Bleak, RN Phone Number: 10/19/2022, 11:44 AM   Clinical Narrative:   EMS schedule for 12:30, RN called report, Patient is ready to discharge to [facility] in [place]. CM called her daughter to update.    Final next level of care: Skilled Nursing Facility Barriers to Discharge: Barriers Resolved   Patient Goals and CMS Choice CMS Medicare.gov Compare Post Acute Care list provided to:: Patient Represenative (must comment) Choice offered to / list presented to : Adult Children  Discharge Placement                 Patient to be transferred to facility by: EMS Name of family member notified: Left daughter a message Patient and family notified of of transfer: 10/19/22  Discharge Plan and Services Additional resources added to the After Visit Summary for       Post Acute Care Choice: Skilled Nursing Facility                  Social Determinants of Health (SDOH) Interventions SDOH Screenings   Food Insecurity: No Food Insecurity (10/14/2022)  Housing: Low Risk  (10/14/2022)  Transportation Needs: No Transportation Needs (10/14/2022)  Utilities: Not At Risk (10/14/2022)  Alcohol Screen: Low Risk  (05/14/2022)  Depression (PHQ2-9): High Risk (08/16/2022)  Financial Resource Strain: Low Risk  (07/05/2022)  Physical Activity: Insufficiently Active (07/05/2022)  Social Connections: Unknown (07/05/2022)  Stress: No Stress Concern Present (07/05/2022)  Tobacco Use: Low Risk  (10/18/2022)     Readmission Risk Interventions     No data to display

## 2022-10-19 NOTE — Progress Notes (Signed)
Mobility Specialist Progress Note:    10/19/22 1008  Mobility  Activity Transferred from bed to chair  Level of Assistance Moderate assist, patient does 50-74%  Assistive Device Other (Comment) (HHA)  Distance Ambulated (ft) 3 ft  Range of Motion/Exercises Other (Comment) (LLE WBAT)  LLE Weight Bearing WBAT  Activity Response Tolerated well  Mobility Referral Yes  $Mobility charge 1 Mobility  Mobility Specialist Start Time (ACUTE ONLY) 0950  Mobility Specialist Stop Time (ACUTE ONLY) 1005  Mobility Specialist Time Calculation (min) (ACUTE ONLY) 15 min   Pt received in bed, daughter in room. Agreeable to mobility session, tolerated well. LLE WBAT, pt reported 8/10 pain during transfer. Required ModA with HHA to stand and pivot to chair. Left pt in chair, daughter in room, all needs met.   [name] Mobility Specialist Please contact via Special educational needs teacher or  Rehab office at [phone]

## 2022-10-19 NOTE — Discharge Summary (Signed)
Lauren Lawrence, is a 82 y.o. female  DOB May 14, 1940  MRN [id number].  Admission date:  10/14/2022  Admitting Physician  Frankey Shown, DO  Discharge Date:  10/19/2022   Primary MD  Malva Limes, MD  Recommendations for primary care physician for things to follow:  1)Okay to use aspirin 81 mg twice daily for DVT prophylaxis for up to 30 days and watch platelets closely, decrease aspirin back to 81 mg daily after 30 days  2) CBC every Friday for 4 weeks starting on Friday, 10/22/2022  3)Avoid ibuprofen/Advil/Aleve/Motrin/Goody Powders/Naproxen/BC powders/Meloxicam/Diclofenac/Indomethacin and other Nonsteroidal anti-inflammatory medications as these will make you more likely to bleed and can cause stomach ulcers, can also cause Kidney problems.  4)Remove staples at 12 to 14 days Postop orthopedic appointment scheduled for 28 days with -Dr. Fuller Canada, MD----Address: [address], [place], [place] [address], Phone: [phone]  Admission Diagnosis  Displaced fracture of left femoral neck (HCC) [S72.002A] Closed fracture of left hip, initial encounter [facility]) [S72.002A]   Discharge Diagnosis  Displaced fracture of left femoral neck (HCC) [S72.002A] Closed fracture of left hip, initial encounter (HCC) [S72.002A]    Principal Problem:   Displaced fracture of left femoral neck (HCC) Active Problems:   Essential (primary) hypertension   Acquired hypothyroidism   Leukopenia   Thrombocytopenia (HCC)   Hypertensive urgency   Dementia without behavioral disturbance (HCC)      Past Medical History:  Diagnosis Date   Depression    Hyperlipidemia    Hypertension    Memory loss     Past Surgical History:  Procedure Laterality Date   CATARACT EXTRACTION  03/2020   HIP ARTHROPLASTY Left 10/14/2022   Procedure: ARTHROPLASTY BIPOLAR HIP (HEMIARTHROPLASTY);  Surgeon: Vickki Hearing, MD;  Location: [facility];  Service: Orthopedics;  Laterality: Left;       HPI  from the history and physical done on the day of admission:   HPI: Lauren Lawrence is a 82 y.o. female with medical history significant of hypertension, hypothyroidism, depression, dementia who presents to the emergency department via EMS from home due to a fall at home.  Patient was not sure how she fell (possibly due to history of dementia), history was obtained from ED physician and ED medical record.  Per report, patient lives with daughter, daughter heard a sound and checked on patient and she was found on the ground moaning with pain, it was thought that she fell when she got up to go to the bathroom.  EMS was activated and patient was taken to the ED for further evaluation and management.   ED Course:  In the emergency department, he was elevated at 208/78, respiratory rate 21 rate per minute and other vital signs were within normal range.  Workup in the ED showed normal CBC except for WBC of 2.2 and platelets 77.  BMP was normal except for blood glucose of 110. Left hip x-ray was positive for transverse, mildly comminuted left femoral neck fracture.  Mild displacement and  varus impaction. Chest x-ray showed no acute cardiopulmonary abnormality CT head without contrast showed no acute intracranial abnormality or acute traumatic injury She was treated with morphine, Zofran and IV labetalol.  Orthopedic surgeon (Dr. Romeo Apple) was consulted and recommended admitting patient with plan to consult on patient this morning. Hospitalist was asked to admit patient for further evaluation and management.     Review of Systems: Review of systems as noted in the HPI. All other systems reviewed and are negative.   Hospital Course:     Brief Summary:  82 y.o. female with medical history significant of hypertension, hypothyroidism, depression, dementia admitted with left hip fracture after a fall at home   A/p 1)Left hip fracture/status post  mechanical fall at home -s/p bipolar hip replacement on 10/14/22 -Further management per orthopedic team Weightbearing as tolerated Direct lateral hip precautions DVT prophylaxis for 30 days-- --remove staples at 12 to 14 days Postop ortho appointment scheduled for 28 days -PT eval appreciated recommends SNF rehab -Pancytopenia noted with platelet count is 81 K (longstanding history of thrombocytopenia and leukopenia--please see [facility] dated 01/22/2014 with platelets of 85 k and WBC of 3.1 at that time) -Okay to use aspirin 81 mg twice daily for DVT prophylaxis for up to 30 today and watch platelets closely   2)HTN--stable, continue Toprol-XL 100 mg daily along with losartan 100 mg daily -IV labetalol as needed   3)Chronic Leukopenia--- WBC currently ids around  3 which is baseline   4)Hypothyroidism--continue levothyroxine 112 mcg daily   5)Chronic Thrombocytopenia- - see #1 above -Weekly CBCs every Friday for the next 4 weeks while on aspirin twice daily dosing for DVT prophylaxis   6)Dementia--stable, continue Aricept   7) chronic anemia--Hgb currently around 10 which is around patient's baseline watch closely postoperatively especially while on  DVT prophylaxis   Disposition: The patient is from: Home              Anticipated d/c is to: SNF               Code Status :  -  Code Status: Full Code    Family Communication:   Discussed with daughters Kenney Houseman and Rosey Bath and son in Social worker [place]   Discharge Condition: stable  Follow UP   Contact information for follow-up providers     Vickki Hearing, MD. Schedule an appointment as soon as possible for a visit in 3 week(s).   Specialties: Orthopedic Surgery, Radiology Contact information: [address] [place] [address] [phone]              Contact information for after-discharge care     Destination     [facility]  [facility] Preferred [facility]F .   Service: Skilled Nursing Contact information: [address] [place] [address] [phone]                     Consults obtained - ortho  Diet and Activity recommendation:  As advised  Discharge Instructions    Discharge Instructions     Call MD for:  difficulty breathing, headache or visual disturbances   Complete by: As directed    Call MD for:  persistant dizziness or light-headedness   Complete by: As directed    Call MD for:  persistant nausea and vomiting   Complete by: As directed    Call MD for:  severe uncontrolled pain   Complete  by: As directed    Call MD for:  temperature >100.4   Complete by: As directed    Diet - low sodium heart healthy   Complete by: As directed    Discharge instructions   Complete by: As directed    1)Okay to use aspirin 81 mg twice daily for DVT prophylaxis for up to 30 days and watch platelets closely, decrease aspirin back to 81 mg daily after 30 days  2) CBC every Friday for 4 weeks starting on Friday, 10/22/2022  3)Avoid ibuprofen/Advil/Aleve/Motrin/Goody Powders/Naproxen/BC powders/Meloxicam/Diclofenac/Indomethacin and other Nonsteroidal anti-inflammatory medications as these will make you more likely to bleed and can cause stomach ulcers, can also cause Kidney problems.  4)Remove staples at 12 to 14 days Postop orthopedic appointment scheduled for 28 days with -Dr. Fuller Canada, MD----Address: [address] [place], [place] [address], Phone: [phone]   Increase activity slowly   Complete by: As directed         Discharge Medications     Allergies as of 10/19/2022   No Known Allergies      Medication List     TAKE these medications    acetaminophen 325 MG tablet Commonly known as: Tylenol Take 2 tablets (650 mg total) by mouth every 6 (six) hours as needed for mild pain or fever.   aspirin EC 81 MG tablet Take 1 tablet (81 mg total) by mouth 2 (two) times  daily with a meal. Swallow whole.   Coenzyme Q10 10 MG capsule Take 10 mg by mouth daily.   donepezil 10 MG tablet Commonly known as: ARICEPT TAKE 1 TABLET BY MOUTH EVERYDAY AT BEDTIME What changed: See the new instructions.   Iron 325 (65 Fe) MG Tabs Take 1 tablet (325 mg total) by mouth daily. What changed:  medication strength how much to take   levothyroxine 112 MCG tablet Commonly known as: SYNTHROID Take 1 tablet (112 mcg total) by mouth daily.   loperamide 2 MG capsule Commonly known as: IMODIUM Take 1 capsule (2 mg total) by mouth as needed for diarrhea or loose stools.   losartan 100 MG tablet Commonly known as: COZAAR Take 1 tablet (100 mg total) by mouth daily.   methocarbamol 500 MG tablet Commonly known as: ROBAXIN Take 1 tablet (500 mg total) by mouth 3 (three) times daily for 10 days.   metoprolol succinate 100 MG 24 hr tablet Commonly known as: TOPROL-XL Take 1 tablet (100 mg total) by mouth daily. Take with or immediately following a meal. What changed: See the new instructions.   traMADol 50 MG tablet Commonly known as: ULTRAM Take 1 tablet (50 mg total) by mouth every 6 (six) hours as needed for moderate pain.        Major procedures and Radiology Reports - PLEASE review detailed and final reports for all details, in brief -   DG HIP UNILAT WITH PELVIS 2-3 VIEWS LEFT  Result Date: 10/14/2022 CLINICAL DATA:  Postop left hip. EXAM: DG HIP (WITH OR WITHOUT PELVIS) 2-3V LEFT COMPARISON:  Preoperative radiograph FINDINGS: Left hip arthroplasty in expected alignment. No periprosthetic lucency or fracture. Recent postsurgical change includes air and edema in the soft tissues. IMPRESSION: Left hip arthroplasty without immediate postoperative complication. Electronically Signed   By: Narda Rutherford M.D.   On: 10/14/2022 17:51   DG Hip Unilat W or Wo Pelvis 2-3 Views Left  Result Date: 10/14/2022 CLINICAL DATA:  82 year old female status post fall.   Left hip pain. EXAM: DG HIP (WITH  OR WITHOUT PELVIS) 2-3V LEFT COMPARISON:  CT Abdomen and Pelvis 02/03/2010. FINDINGS: Transverse, mildly comminuted left femoral neck fracture. 1/2 shaft width displacement and mild varus impaction on the frog-leg lateral view. The proximal left femur intertrochanteric segment appears to remain intact. Left femoral head remains normally located. No acute pelvis fracture identified. Grossly intact proximal right femur. Negative visible bowel gas. IMPRESSION: Positive for transverse, mildly comminuted Left Femoral Neck Fracture. Mild displacement and varus impaction. Electronically Signed   By: Odessa Fleming M.D.   On: 10/14/2022 05:01   DG Chest 1 View  Result Date: 10/14/2022 CLINICAL DATA:  82 year old female status post fall. Left femoral neck fracture. EXAM: CHEST  1 VIEW COMPARISON:  No prior chest imaging. CT Abdomen and Pelvis 02/03/2010. FINDINGS: AP supine view at 0421 hours. Mild cardiomegaly. Other mediastinal contours are within normal limits. Visualized tracheal air column is within normal limits. Normal lung volumes. Allowing for portable technique the lungs are clear. Visible osseous structures appear intact. Paucity of bowel gas in the visible abdomen. IMPRESSION: Mild cardiomegaly. No acute cardiopulmonary abnormality. Electronically Signed   By: Odessa Fleming M.D.   On: 10/14/2022 04:59   CT HEAD WO CONTRAST ( )  Result Date: 10/14/2022 CLINICAL DATA:  82 year old female status post fall with hip pain. EXAM: CT HEAD WITHOUT CONTRAST TECHNIQUE: Contiguous axial images were obtained from the base of the skull through the vertex without intravenous contrast. RADIATION DOSE REDUCTION: This exam was performed according to the departmental dose-optimization program which includes automated exposure control, adjustment of the mA and/or kV according to patient size and/or use of iterative reconstruction technique. COMPARISON:  Brain MRI 10/17/2019. FINDINGS: Brain: Mild  motion artifact at the base of skull. No midline shift, ventriculomegaly, mass effect, evidence of mass lesion, intracranial hemorrhage or evidence of cortically based acute infarction. Mild left unilateral basal ganglia vascular calcifications. Mild additional generalized cerebral volume loss since the 2021 MRI. Gray-white differentiation preserved, within normal limits for age. Vascular: Calcified atherosclerosis at the skull base. No suspicious intracranial vascular hyperdensity. Skull: Mild motion artifact at the skull base. No acute osseous abnormality identified. Sinuses/Orbits: Visualized paranasal sinuses and mastoids are stable and well aerated. Other: No acute orbit or scalp soft tissue injury identified. IMPRESSION: Mild motion artifact at the skull base. No acute intracranial abnormality or acute traumatic injury identified. Electronically Signed   By: Odessa Fleming M.D.   On: 10/14/2022 04:30    Today   Subjective    Jenalyn Gullo today has no new complaints  No fever  Or chills  -Diarrhea improved          Patient has been seen and examined prior to discharge   Objective   Blood pressure (!) 164/53, pulse 60, temperature 98.6 F (37 C), temperature source Oral, resp. rate 16, height 5\' 1"  (1.549 m), weight 54.4 kg, SpO2 96 %.   Intake/Output Summary (Last 24 hours) at 10/19/2022 1141 Last data filed at 10/19/2022 0900 Gross per 24 hour  Intake 960 ml  Output --  Net 960 ml   Exam Gen:- Awake Alert,  in no apparent distress  HEENT:- [place].AT, No sclera icterus Neck-Supple Neck,No JVD,.  Lungs-  CTAB , fair symmetrical air movement CV- S1, S2 normal, regular  Abd-  +ve B.Sounds, Abd Soft, No tenderness,    Extremity/Skin:- No  edema, pedal pulses present  Psych-affect is appropriate, oriented x3, underlying/baseline mild memory and cognitive deficits Neuro--weakness, no new focal deficits, no tremors MSK-left hip postop wound clean  dry and intact   Data Review   CBC w Diff:  Lab  Results  Component Value Date   WBC 3.1 (L) 10/18/2022   HGB 9.9 (L) 10/18/2022   HGB 10.1 (L) 08/16/2022   HCT 31.2 (L) 10/18/2022   HCT 31.3 (L) 08/16/2022   PLT 81 (L) 10/18/2022   PLT 103 (L) 08/16/2022   LYMPHOPCT 23 10/14/2022   LYMPHOPCT 25.2 06/19/2013   MONOPCT 11 10/14/2022   MONOPCT 9.2 06/19/2013   EOSPCT 6 10/14/2022   EOSPCT 2.4 06/19/2013   BASOPCT 1 10/14/2022   BASOPCT 0.9 06/19/2013   CMP:  Lab Results  Component Value Date   NA 135 10/17/2022   NA 142 08/16/2022   NA 137 07/22/2011   K 3.8 10/17/2022   K 3.9 07/22/2011   CL 106 10/17/2022   CL 98 07/22/2011   CO2 22 10/17/2022   CO2 31 07/22/2011   BUN 29 (H) 10/17/2022   BUN 17 08/16/2022   BUN 9 07/22/2011   CREATININE 0.79 10/17/2022   CREATININE 0.61 07/22/2011   GLU 92 02/21/2014   PROT 5.3 (L) 10/15/2022   PROT 6.2 08/16/2022   PROT 7.4 04/06/2012   ALBUMIN 2.8 (L) 10/15/2022   ALBUMIN 3.6 (L) 08/16/2022   ALBUMIN 4.0 04/06/2012   BILITOT 0.9 10/15/2022   BILITOT 0.9 08/16/2022   BILITOT 0.6 04/06/2012   ALKPHOS 65 10/15/2022   ALKPHOS 63 04/06/2012   AST 50 (H) 10/15/2022   AST 45 (H) 04/06/2012   ALT 32 10/15/2022   ALT 51 04/06/2012  . Total Discharge time is about 33 minutes  Shon Hale M.D on 10/19/2022 at 11:41 AM  Go to [email or web address] -  for contact info  [facility] - Office  [phone]

## 2022-10-25 ENCOUNTER — Ambulatory Visit: Payer: Medicare PPO | Admitting: Family Medicine

## 2022-10-25 DIAGNOSIS — D509 Iron deficiency anemia, unspecified: Secondary | ICD-10-CM | POA: Diagnosis not present

## 2022-10-25 DIAGNOSIS — Z7982 Long term (current) use of aspirin: Secondary | ICD-10-CM | POA: Diagnosis not present

## 2022-10-25 DIAGNOSIS — I10 Essential (primary) hypertension: Secondary | ICD-10-CM | POA: Diagnosis not present

## 2022-10-25 DIAGNOSIS — F32A Depression, unspecified: Secondary | ICD-10-CM | POA: Diagnosis not present

## 2022-10-25 DIAGNOSIS — W19XXXD Unspecified fall, subsequent encounter: Secondary | ICD-10-CM | POA: Diagnosis not present

## 2022-10-25 DIAGNOSIS — F03A3 Unspecified dementia, mild, with mood disturbance: Secondary | ICD-10-CM | POA: Diagnosis not present

## 2022-10-25 DIAGNOSIS — E039 Hypothyroidism, unspecified: Secondary | ICD-10-CM | POA: Diagnosis not present

## 2022-10-25 DIAGNOSIS — D696 Thrombocytopenia, unspecified: Secondary | ICD-10-CM | POA: Diagnosis not present

## 2022-10-25 DIAGNOSIS — S72002D Fracture of unspecified part of neck of left femur, subsequent encounter for closed fracture with routine healing: Secondary | ICD-10-CM | POA: Diagnosis not present

## 2022-10-27 DIAGNOSIS — E785 Hyperlipidemia, unspecified: Secondary | ICD-10-CM | POA: Diagnosis not present

## 2022-10-27 DIAGNOSIS — Z7982 Long term (current) use of aspirin: Secondary | ICD-10-CM | POA: Diagnosis not present

## 2022-10-27 DIAGNOSIS — E039 Hypothyroidism, unspecified: Secondary | ICD-10-CM | POA: Diagnosis not present

## 2022-10-27 DIAGNOSIS — D696 Thrombocytopenia, unspecified: Secondary | ICD-10-CM | POA: Diagnosis not present

## 2022-10-27 DIAGNOSIS — D72819 Decreased white blood cell count, unspecified: Secondary | ICD-10-CM | POA: Diagnosis not present

## 2022-10-27 DIAGNOSIS — S72002D Fracture of unspecified part of neck of left femur, subsequent encounter for closed fracture with routine healing: Secondary | ICD-10-CM | POA: Diagnosis not present

## 2022-10-27 DIAGNOSIS — I10 Essential (primary) hypertension: Secondary | ICD-10-CM | POA: Diagnosis not present

## 2022-10-29 DIAGNOSIS — D72819 Decreased white blood cell count, unspecified: Secondary | ICD-10-CM | POA: Diagnosis not present

## 2022-10-29 DIAGNOSIS — Z7982 Long term (current) use of aspirin: Secondary | ICD-10-CM | POA: Diagnosis not present

## 2022-10-29 DIAGNOSIS — E785 Hyperlipidemia, unspecified: Secondary | ICD-10-CM | POA: Diagnosis not present

## 2022-10-29 DIAGNOSIS — E039 Hypothyroidism, unspecified: Secondary | ICD-10-CM | POA: Diagnosis not present

## 2022-10-29 DIAGNOSIS — D696 Thrombocytopenia, unspecified: Secondary | ICD-10-CM | POA: Diagnosis not present

## 2022-10-29 DIAGNOSIS — I10 Essential (primary) hypertension: Secondary | ICD-10-CM | POA: Diagnosis not present

## 2022-10-29 DIAGNOSIS — S72002D Fracture of unspecified part of neck of left femur, subsequent encounter for closed fracture with routine healing: Secondary | ICD-10-CM | POA: Diagnosis not present

## 2022-11-01 DIAGNOSIS — F03A3 Unspecified dementia, mild, with mood disturbance: Secondary | ICD-10-CM | POA: Diagnosis not present

## 2022-11-01 DIAGNOSIS — D72819 Decreased white blood cell count, unspecified: Secondary | ICD-10-CM | POA: Diagnosis not present

## 2022-11-01 DIAGNOSIS — Z7982 Long term (current) use of aspirin: Secondary | ICD-10-CM | POA: Diagnosis not present

## 2022-11-01 DIAGNOSIS — D696 Thrombocytopenia, unspecified: Secondary | ICD-10-CM | POA: Diagnosis not present

## 2022-11-01 DIAGNOSIS — S72002D Fracture of unspecified part of neck of left femur, subsequent encounter for closed fracture with routine healing: Secondary | ICD-10-CM | POA: Diagnosis not present

## 2022-11-01 DIAGNOSIS — E039 Hypothyroidism, unspecified: Secondary | ICD-10-CM | POA: Diagnosis not present

## 2022-11-01 DIAGNOSIS — D509 Iron deficiency anemia, unspecified: Secondary | ICD-10-CM | POA: Diagnosis not present

## 2022-11-01 DIAGNOSIS — I10 Essential (primary) hypertension: Secondary | ICD-10-CM | POA: Diagnosis not present

## 2022-11-01 DIAGNOSIS — G309 Alzheimer's disease, unspecified: Secondary | ICD-10-CM | POA: Diagnosis not present

## 2022-11-02 DIAGNOSIS — E785 Hyperlipidemia, unspecified: Secondary | ICD-10-CM | POA: Diagnosis not present

## 2022-11-02 DIAGNOSIS — D696 Thrombocytopenia, unspecified: Secondary | ICD-10-CM | POA: Diagnosis not present

## 2022-11-02 DIAGNOSIS — E039 Hypothyroidism, unspecified: Secondary | ICD-10-CM | POA: Diagnosis not present

## 2022-11-02 DIAGNOSIS — D72819 Decreased white blood cell count, unspecified: Secondary | ICD-10-CM | POA: Diagnosis not present

## 2022-11-02 DIAGNOSIS — S72002D Fracture of unspecified part of neck of left femur, subsequent encounter for closed fracture with routine healing: Secondary | ICD-10-CM | POA: Diagnosis not present

## 2022-11-02 DIAGNOSIS — D509 Iron deficiency anemia, unspecified: Secondary | ICD-10-CM | POA: Diagnosis not present

## 2022-11-02 DIAGNOSIS — I10 Essential (primary) hypertension: Secondary | ICD-10-CM | POA: Diagnosis not present

## 2022-11-02 DIAGNOSIS — G309 Alzheimer's disease, unspecified: Secondary | ICD-10-CM | POA: Diagnosis not present

## 2022-11-02 DIAGNOSIS — F03A3 Unspecified dementia, mild, with mood disturbance: Secondary | ICD-10-CM | POA: Diagnosis not present

## 2022-11-03 DIAGNOSIS — I10 Essential (primary) hypertension: Secondary | ICD-10-CM | POA: Diagnosis not present

## 2022-11-03 DIAGNOSIS — G309 Alzheimer's disease, unspecified: Secondary | ICD-10-CM | POA: Diagnosis not present

## 2022-11-03 DIAGNOSIS — Z7982 Long term (current) use of aspirin: Secondary | ICD-10-CM | POA: Diagnosis not present

## 2022-11-03 DIAGNOSIS — D509 Iron deficiency anemia, unspecified: Secondary | ICD-10-CM | POA: Diagnosis not present

## 2022-11-03 DIAGNOSIS — D72819 Decreased white blood cell count, unspecified: Secondary | ICD-10-CM | POA: Diagnosis not present

## 2022-11-03 DIAGNOSIS — D696 Thrombocytopenia, unspecified: Secondary | ICD-10-CM | POA: Diagnosis not present

## 2022-11-03 DIAGNOSIS — S72002D Fracture of unspecified part of neck of left femur, subsequent encounter for closed fracture with routine healing: Secondary | ICD-10-CM | POA: Diagnosis not present

## 2022-11-03 DIAGNOSIS — E039 Hypothyroidism, unspecified: Secondary | ICD-10-CM | POA: Diagnosis not present

## 2022-11-03 DIAGNOSIS — F03A3 Unspecified dementia, mild, with mood disturbance: Secondary | ICD-10-CM | POA: Diagnosis not present

## 2022-11-08 DIAGNOSIS — Z96642 Presence of left artificial hip joint: Secondary | ICD-10-CM | POA: Diagnosis not present

## 2022-11-08 DIAGNOSIS — D696 Thrombocytopenia, unspecified: Secondary | ICD-10-CM | POA: Diagnosis not present

## 2022-11-08 DIAGNOSIS — D509 Iron deficiency anemia, unspecified: Secondary | ICD-10-CM | POA: Diagnosis not present

## 2022-11-08 DIAGNOSIS — F02A3 Dementia in other diseases classified elsewhere, mild, with mood disturbance: Secondary | ICD-10-CM | POA: Diagnosis not present

## 2022-11-08 DIAGNOSIS — S72002D Fracture of unspecified part of neck of left femur, subsequent encounter for closed fracture with routine healing: Secondary | ICD-10-CM | POA: Diagnosis not present

## 2022-11-08 DIAGNOSIS — G309 Alzheimer's disease, unspecified: Secondary | ICD-10-CM | POA: Diagnosis not present

## 2022-11-08 DIAGNOSIS — E039 Hypothyroidism, unspecified: Secondary | ICD-10-CM | POA: Diagnosis not present

## 2022-11-08 DIAGNOSIS — F32A Depression, unspecified: Secondary | ICD-10-CM | POA: Diagnosis not present

## 2022-11-08 DIAGNOSIS — I10 Essential (primary) hypertension: Secondary | ICD-10-CM | POA: Diagnosis not present

## 2022-11-09 DIAGNOSIS — E039 Hypothyroidism, unspecified: Secondary | ICD-10-CM | POA: Diagnosis not present

## 2022-11-09 DIAGNOSIS — I10 Essential (primary) hypertension: Secondary | ICD-10-CM | POA: Diagnosis not present

## 2022-11-09 DIAGNOSIS — S72002D Fracture of unspecified part of neck of left femur, subsequent encounter for closed fracture with routine healing: Secondary | ICD-10-CM | POA: Diagnosis not present

## 2022-11-09 DIAGNOSIS — F32A Depression, unspecified: Secondary | ICD-10-CM | POA: Diagnosis not present

## 2022-11-09 DIAGNOSIS — Z96642 Presence of left artificial hip joint: Secondary | ICD-10-CM | POA: Diagnosis not present

## 2022-11-09 DIAGNOSIS — G309 Alzheimer's disease, unspecified: Secondary | ICD-10-CM | POA: Diagnosis not present

## 2022-11-09 DIAGNOSIS — D509 Iron deficiency anemia, unspecified: Secondary | ICD-10-CM | POA: Diagnosis not present

## 2022-11-09 DIAGNOSIS — D696 Thrombocytopenia, unspecified: Secondary | ICD-10-CM | POA: Diagnosis not present

## 2022-11-09 DIAGNOSIS — F02A3 Dementia in other diseases classified elsewhere, mild, with mood disturbance: Secondary | ICD-10-CM | POA: Diagnosis not present

## 2022-11-10 DIAGNOSIS — D696 Thrombocytopenia, unspecified: Secondary | ICD-10-CM | POA: Diagnosis not present

## 2022-11-10 DIAGNOSIS — I10 Essential (primary) hypertension: Secondary | ICD-10-CM | POA: Diagnosis not present

## 2022-11-10 DIAGNOSIS — F32A Depression, unspecified: Secondary | ICD-10-CM | POA: Diagnosis not present

## 2022-11-10 DIAGNOSIS — F02A3 Dementia in other diseases classified elsewhere, mild, with mood disturbance: Secondary | ICD-10-CM | POA: Diagnosis not present

## 2022-11-10 DIAGNOSIS — Z96642 Presence of left artificial hip joint: Secondary | ICD-10-CM | POA: Diagnosis not present

## 2022-11-10 DIAGNOSIS — E039 Hypothyroidism, unspecified: Secondary | ICD-10-CM | POA: Diagnosis not present

## 2022-11-10 DIAGNOSIS — D509 Iron deficiency anemia, unspecified: Secondary | ICD-10-CM | POA: Diagnosis not present

## 2022-11-10 DIAGNOSIS — G309 Alzheimer's disease, unspecified: Secondary | ICD-10-CM | POA: Diagnosis not present

## 2022-11-10 DIAGNOSIS — S72002D Fracture of unspecified part of neck of left femur, subsequent encounter for closed fracture with routine healing: Secondary | ICD-10-CM | POA: Diagnosis not present

## 2022-11-11 ENCOUNTER — Ambulatory Visit (INDEPENDENT_AMBULATORY_CARE_PROVIDER_SITE_OTHER): Payer: Medicare PPO | Admitting: Orthopedic Surgery

## 2022-11-11 ENCOUNTER — Encounter: Payer: Self-pay | Admitting: Orthopedic Surgery

## 2022-11-11 VITALS — BP 143/71 | HR 61 | Ht 61.0 in | Wt 119.0 lb

## 2022-11-11 DIAGNOSIS — S72002A Fracture of unspecified part of neck of left femur, initial encounter for closed fracture: Secondary | ICD-10-CM

## 2022-11-11 NOTE — Progress Notes (Signed)
   This is a postop visit  Encounter Diagnosis  Name Primary?   Displaced fracture of left femoral neck (HCC) 10/14/22 bipolar replacement Yes    Chief Complaint  Patient presents with   Post-op Follow-up    10/14/22 left hip bipolar surgery after fracture walking with walker      This is postop day number 28  Postop pain control  tramadol, tylenol  Subjective complaints  none  Physical exam findings  healed incision   Assessment and plan  82 year old female status post bipolar left hip replacement.  She is at 4 weeks doing well.  She is having some knee pain which started last year and we will address that on her next visit  For now she will continue physical therapy strengthening gait training etc. follow-up in 2 months for x-rays of the hip and the knee  Pre op dx left femoral neck fracture displaced   Post op dx left femoral neck fracture displaced   Procedure bipolar partial hip replacement   Surgeon Vickki Hearing, Jr.   Implants [employer]uy [place] press-fit stem size 5 with a 49 head and a +5 neck bipolar

## 2022-11-15 DIAGNOSIS — D509 Iron deficiency anemia, unspecified: Secondary | ICD-10-CM | POA: Diagnosis not present

## 2022-11-15 DIAGNOSIS — E785 Hyperlipidemia, unspecified: Secondary | ICD-10-CM

## 2022-11-15 DIAGNOSIS — Z96642 Presence of left artificial hip joint: Secondary | ICD-10-CM | POA: Diagnosis not present

## 2022-11-15 DIAGNOSIS — G309 Alzheimer's disease, unspecified: Secondary | ICD-10-CM | POA: Diagnosis not present

## 2022-11-15 DIAGNOSIS — D696 Thrombocytopenia, unspecified: Secondary | ICD-10-CM | POA: Diagnosis not present

## 2022-11-15 DIAGNOSIS — I10 Essential (primary) hypertension: Secondary | ICD-10-CM | POA: Diagnosis not present

## 2022-11-15 DIAGNOSIS — Z9181 History of falling: Secondary | ICD-10-CM

## 2022-11-15 DIAGNOSIS — S72002D Fracture of unspecified part of neck of left femur, subsequent encounter for closed fracture with routine healing: Secondary | ICD-10-CM | POA: Diagnosis not present

## 2022-11-15 DIAGNOSIS — Z7982 Long term (current) use of aspirin: Secondary | ICD-10-CM

## 2022-11-15 DIAGNOSIS — E039 Hypothyroidism, unspecified: Secondary | ICD-10-CM | POA: Diagnosis not present

## 2022-11-15 DIAGNOSIS — F32A Depression, unspecified: Secondary | ICD-10-CM | POA: Diagnosis not present

## 2022-11-15 DIAGNOSIS — F02A3 Dementia in other diseases classified elsewhere, mild, with mood disturbance: Secondary | ICD-10-CM | POA: Diagnosis not present

## 2022-11-17 DIAGNOSIS — Z96642 Presence of left artificial hip joint: Secondary | ICD-10-CM | POA: Diagnosis not present

## 2022-11-17 DIAGNOSIS — I10 Essential (primary) hypertension: Secondary | ICD-10-CM | POA: Diagnosis not present

## 2022-11-17 DIAGNOSIS — D509 Iron deficiency anemia, unspecified: Secondary | ICD-10-CM | POA: Diagnosis not present

## 2022-11-17 DIAGNOSIS — F32A Depression, unspecified: Secondary | ICD-10-CM | POA: Diagnosis not present

## 2022-11-17 DIAGNOSIS — E039 Hypothyroidism, unspecified: Secondary | ICD-10-CM | POA: Diagnosis not present

## 2022-11-17 DIAGNOSIS — G309 Alzheimer's disease, unspecified: Secondary | ICD-10-CM | POA: Diagnosis not present

## 2022-11-17 DIAGNOSIS — S72002D Fracture of unspecified part of neck of left femur, subsequent encounter for closed fracture with routine healing: Secondary | ICD-10-CM | POA: Diagnosis not present

## 2022-11-17 DIAGNOSIS — F02A3 Dementia in other diseases classified elsewhere, mild, with mood disturbance: Secondary | ICD-10-CM | POA: Diagnosis not present

## 2022-11-17 DIAGNOSIS — D696 Thrombocytopenia, unspecified: Secondary | ICD-10-CM | POA: Diagnosis not present

## 2022-11-19 ENCOUNTER — Encounter: Payer: Self-pay | Admitting: Family Medicine

## 2022-11-19 ENCOUNTER — Ambulatory Visit: Payer: Medicare PPO | Admitting: Family Medicine

## 2022-11-19 VITALS — BP 137/54 | HR 60 | Temp 98.3°F | Resp 16 | Ht 61.0 in | Wt 119.7 lb

## 2022-11-19 DIAGNOSIS — D509 Iron deficiency anemia, unspecified: Secondary | ICD-10-CM

## 2022-11-19 DIAGNOSIS — I10 Essential (primary) hypertension: Secondary | ICD-10-CM | POA: Diagnosis not present

## 2022-11-19 DIAGNOSIS — Z96642 Presence of left artificial hip joint: Secondary | ICD-10-CM | POA: Diagnosis not present

## 2022-11-19 DIAGNOSIS — R413 Other amnesia: Secondary | ICD-10-CM | POA: Diagnosis not present

## 2022-11-19 DIAGNOSIS — D696 Thrombocytopenia, unspecified: Secondary | ICD-10-CM

## 2022-11-19 DIAGNOSIS — E039 Hypothyroidism, unspecified: Secondary | ICD-10-CM | POA: Diagnosis not present

## 2022-11-19 DIAGNOSIS — F02A3 Dementia in other diseases classified elsewhere, mild, with mood disturbance: Secondary | ICD-10-CM | POA: Diagnosis not present

## 2022-11-19 DIAGNOSIS — S72002D Fracture of unspecified part of neck of left femur, subsequent encounter for closed fracture with routine healing: Secondary | ICD-10-CM | POA: Diagnosis not present

## 2022-11-19 DIAGNOSIS — E559 Vitamin D deficiency, unspecified: Secondary | ICD-10-CM | POA: Diagnosis not present

## 2022-11-19 DIAGNOSIS — G309 Alzheimer's disease, unspecified: Secondary | ICD-10-CM | POA: Diagnosis not present

## 2022-11-19 DIAGNOSIS — F32A Depression, unspecified: Secondary | ICD-10-CM | POA: Diagnosis not present

## 2022-11-19 MED ORDER — RISEDRONATE SODIUM 35 MG PO TABS: 35.0000 mg | ORAL_TABLET | ORAL | 4 refills | Status: DC

## 2022-11-19 MED ORDER — MEMANTINE HCL 28 X 5 MG & 21 X 10 MG PO TABS: ORAL_TABLET | ORAL | 12 refills | Status: DC

## 2022-11-19 NOTE — Progress Notes (Signed)
Established patient visit   Patient: Lauren Lawrence   DOB: [birth year]   82 y.o. Female  MRN: [id number] Visit Date: 11/19/2022  Today's healthcare provider: Mila Merry, MD   Chief Complaint  Patient presents with   Hospitalization Follow-up    Patient reports she is feeling better, she denies any pain. She has stopped taking tramadol and imodium.  Patient's daughter is here today. She has requested for [place] to do a memory test. Patient reports she has been having trouble with her memory.    Subjective    Discussed the use of AI scribe software for clinical note transcription with the patient, who gave verbal consent to proceed.  History of Present Illness   The patient, with a history of osteoporosis and memory loss, recently experienced a fall resulting in a hip fracture and partial hip replacement. The fall occurred at night, and the patient had been experiencing a decrease in strength and stability for the preceding four to six months. Since the fall, she was discharged to rehab and [facility] and has been at for two weeks, with her daughter staying with her to assist in recovery. The patient has been participating in home physical therapy once a week.  The patient's memory loss, particularly short-term memory, has been progressively worsening over the past year, which has been impacting her daily tasks and recovery. Despite this, the patient's mathematical abilities remain intact. The patient has been on Aricept for memory loss, but the effectiveness of this medication is in question.  The patient has a history of osteoporosis and has previously been prescribed Fosamax and Evista, but these were discontinued due to side effects. The patient has not been on any osteoporosis medication recently. The patient's thyroid levels have been low in the past, and the dosage of her thyroid medication was increased when last checked in July. The patient has been taking this increased dosage  regularly for the past few months.  The patient has been taking a multivitamin in addition to her prescribed medications. The patient has been experiencing difficulty with the number of pills she is required to take daily. The patient's iron levels have been low, and she has been prescribed iron supplements.  The patient's hip recovery has been progressing well, with no falls or near falls since returning home. The patient had a follow-up appointment with her orthopedist, Dr. Romeo Apple, four weeks after her surgery. No x-rays were taken during this appointment, but the incision was examined and appeared to be healing well.       Medications: Outpatient Medications Prior to Visit  Medication Sig   acetaminophen (TYLENOL) 325 MG tablet Take 2 tablets (650 mg total) by mouth every 6 (six) hours as needed for mild pain or fever.   aspirin EC 81 MG tablet Take 81 mg by mouth daily. Swallow whole.   Coenzyme Q10 10 MG capsule Take 10 mg by mouth daily.   donepezil (ARICEPT) 10 MG tablet TAKE 1 TABLET BY MOUTH EVERYDAY AT BEDTIME (Patient taking differently: Take 10 mg by mouth at bedtime.)   Ferrous Sulfate (IRON) 325 (65 Fe) MG TABS Take 1 tablet (325 mg total) by mouth daily.   levothyroxine (SYNTHROID) 112 MCG tablet Take 1 tablet (112 mcg total) by mouth daily.   losartan (COZAAR) 100 MG tablet Take 1 tablet (100 mg total) by mouth daily.   metoprolol succinate (TOPROL-XL) 100 MG 24 hr tablet Take 1 tablet (100 mg total) by mouth daily. Take  with or immediately following a meal.   loperamide (IMODIUM) 2 MG capsule Take 1 capsule (2 mg total) by mouth as needed for diarrhea or loose stools.   traMADol (ULTRAM) 50 MG tablet Take 1 tablet (50 mg total) by mouth every 6 (six) hours as needed for moderate pain.   No facility-administered medications prior to visit.     Objective    BP (!) 137/54 (BP Location: Left Arm, Patient Position: Sitting, Cuff Size: Large)   Pulse 60   Temp 98.3 F (36.8  C) (Temporal)   Resp 16   Ht 5\' 1"  (1.549 m)   Wt 119 lb 11.2 oz (54.3 kg)   SpO2 97%   BMI 22.62 kg/m    Physical Exam   General: Appearance:    Well developed, well nourished female in no acute distress  Eyes:    PERRL, conjunctiva/corneas clear, EOM's intact       Lungs:     Clear to auscultation bilaterally, respirations unlabored  Heart:    Normal heart rate. Normal rhythm. No murmurs, rubs, or gallops.    MS:   All extremities are intact.    Neurologic:   Awake, alert, oriented x 2. No apparent focal neurological defect.           11/19/2022    3:08 PM 05/11/2021   10:23 AM 08/11/2020    1:43 PM  MMSE - Mini Mental State Exam  Orientation to time 1 2 4   Orientation to Place 5 5 2   Registration 3 3 3   Attention/ Calculation 4 5 3   Recall 0 0 2  Language- name 2 objects 2 2 2   Language- repeat 1 1 1   Language- follow 3 step command 3 3 3   Language- read & follow direction 1 1 1   Write a sentence 1 1 1   Copy design 0 1 1  Total score 21 24 23      Assessment & Plan     Assessment and Plan    Hip Fracture Recent partial hip replacement due to a fall. Healing well with no recent falls. Currently undergoing home physical therapy once a week. -Continue home physical therapy.  Osteoporosis History of osteoporosis with recent hip fracture. Previously on Fosamax and Evista, but currently not on any osteoporosis medication. -Start Actonel once a week. -Check vitamin D and calcium levels.  Hypothyroidism On 125 mcg of levothyroxine. Compliance has improved in recent months. -Check thyroid function tests.  Cognitive Impairment Noted decline in short-term memory over the past year. Currently on Aricept. -Add Namenda to current regimen. -Reassess cognitive function in a few months.  General Health Maintenance -Order labs for vitamin D, calcium, and thyroid function tests. -Follow-up appointment at the end of October.    Return in about 12 weeks (around 02/11/2023).       Mila Merry, MD  [facility] [phone] (phone) [phone] (fax)  [facility]

## 2022-11-19 NOTE — Patient Instructions (Signed)
.   Please review the attached list of medications and notify my office if there are any errors.   . Please bring all of your medications to every appointment so we can make sure that our medication list is the same as yours.   

## 2022-11-24 DIAGNOSIS — D696 Thrombocytopenia, unspecified: Secondary | ICD-10-CM | POA: Diagnosis not present

## 2022-11-24 DIAGNOSIS — G309 Alzheimer's disease, unspecified: Secondary | ICD-10-CM | POA: Diagnosis not present

## 2022-11-24 DIAGNOSIS — D509 Iron deficiency anemia, unspecified: Secondary | ICD-10-CM | POA: Diagnosis not present

## 2022-11-24 DIAGNOSIS — S72002D Fracture of unspecified part of neck of left femur, subsequent encounter for closed fracture with routine healing: Secondary | ICD-10-CM | POA: Diagnosis not present

## 2022-11-24 DIAGNOSIS — F02A3 Dementia in other diseases classified elsewhere, mild, with mood disturbance: Secondary | ICD-10-CM | POA: Diagnosis not present

## 2022-11-24 DIAGNOSIS — I10 Essential (primary) hypertension: Secondary | ICD-10-CM | POA: Diagnosis not present

## 2022-11-24 DIAGNOSIS — Z96642 Presence of left artificial hip joint: Secondary | ICD-10-CM | POA: Diagnosis not present

## 2022-11-24 DIAGNOSIS — E039 Hypothyroidism, unspecified: Secondary | ICD-10-CM | POA: Diagnosis not present

## 2022-11-24 DIAGNOSIS — F32A Depression, unspecified: Secondary | ICD-10-CM | POA: Diagnosis not present

## 2022-12-01 DIAGNOSIS — F02A3 Dementia in other diseases classified elsewhere, mild, with mood disturbance: Secondary | ICD-10-CM | POA: Diagnosis not present

## 2022-12-01 DIAGNOSIS — E039 Hypothyroidism, unspecified: Secondary | ICD-10-CM | POA: Diagnosis not present

## 2022-12-01 DIAGNOSIS — D696 Thrombocytopenia, unspecified: Secondary | ICD-10-CM | POA: Diagnosis not present

## 2022-12-01 DIAGNOSIS — D509 Iron deficiency anemia, unspecified: Secondary | ICD-10-CM | POA: Diagnosis not present

## 2022-12-01 DIAGNOSIS — F32A Depression, unspecified: Secondary | ICD-10-CM | POA: Diagnosis not present

## 2022-12-01 DIAGNOSIS — I10 Essential (primary) hypertension: Secondary | ICD-10-CM | POA: Diagnosis not present

## 2022-12-01 DIAGNOSIS — Z96642 Presence of left artificial hip joint: Secondary | ICD-10-CM | POA: Diagnosis not present

## 2022-12-01 DIAGNOSIS — S72002D Fracture of unspecified part of neck of left femur, subsequent encounter for closed fracture with routine healing: Secondary | ICD-10-CM | POA: Diagnosis not present

## 2022-12-01 DIAGNOSIS — G309 Alzheimer's disease, unspecified: Secondary | ICD-10-CM | POA: Diagnosis not present

## 2022-12-02 ENCOUNTER — Telehealth: Payer: Self-pay | Admitting: Family Medicine

## 2022-12-02 NOTE — Telephone Encounter (Signed)
April, nurse with [facility] called and stated that they faxed over information on bone density testing form on 7/22 to fax #[phone]. Please update status of form.  Per April, this needs to be signed and faxed back to # [phone]   [facility] # [phone]

## 2022-12-08 DIAGNOSIS — E039 Hypothyroidism, unspecified: Secondary | ICD-10-CM | POA: Diagnosis not present

## 2022-12-08 DIAGNOSIS — S72002D Fracture of unspecified part of neck of left femur, subsequent encounter for closed fracture with routine healing: Secondary | ICD-10-CM | POA: Diagnosis not present

## 2022-12-08 DIAGNOSIS — Z96642 Presence of left artificial hip joint: Secondary | ICD-10-CM | POA: Diagnosis not present

## 2022-12-08 DIAGNOSIS — F32A Depression, unspecified: Secondary | ICD-10-CM | POA: Diagnosis not present

## 2022-12-08 DIAGNOSIS — G309 Alzheimer's disease, unspecified: Secondary | ICD-10-CM | POA: Diagnosis not present

## 2022-12-08 DIAGNOSIS — I10 Essential (primary) hypertension: Secondary | ICD-10-CM | POA: Diagnosis not present

## 2022-12-08 DIAGNOSIS — D696 Thrombocytopenia, unspecified: Secondary | ICD-10-CM | POA: Diagnosis not present

## 2022-12-08 DIAGNOSIS — F02A3 Dementia in other diseases classified elsewhere, mild, with mood disturbance: Secondary | ICD-10-CM | POA: Diagnosis not present

## 2022-12-08 DIAGNOSIS — D509 Iron deficiency anemia, unspecified: Secondary | ICD-10-CM | POA: Diagnosis not present

## 2022-12-10 ENCOUNTER — Telehealth: Payer: Self-pay | Admitting: Family Medicine

## 2022-12-10 NOTE — Telephone Encounter (Signed)
Lauren Lawrence from [facility] called to f/u on paperwork was was faxed and recvd on 7/22. She is requesting it be completed and faxed back to the number listed on the forms.

## 2022-12-14 ENCOUNTER — Other Ambulatory Visit: Payer: Self-pay | Admitting: Family Medicine

## 2022-12-14 MED ORDER — MEMANTINE HCL 10 MG PO TABS: 10.0000 mg | ORAL_TABLET | Freq: Two times a day (BID) | ORAL | 1 refills | Status: DC

## 2022-12-15 DIAGNOSIS — F02A3 Dementia in other diseases classified elsewhere, mild, with mood disturbance: Secondary | ICD-10-CM | POA: Diagnosis not present

## 2022-12-15 DIAGNOSIS — S72002D Fracture of unspecified part of neck of left femur, subsequent encounter for closed fracture with routine healing: Secondary | ICD-10-CM | POA: Diagnosis not present

## 2022-12-15 DIAGNOSIS — Z96642 Presence of left artificial hip joint: Secondary | ICD-10-CM | POA: Diagnosis not present

## 2022-12-15 DIAGNOSIS — F32A Depression, unspecified: Secondary | ICD-10-CM | POA: Diagnosis not present

## 2022-12-15 DIAGNOSIS — G309 Alzheimer's disease, unspecified: Secondary | ICD-10-CM | POA: Diagnosis not present

## 2022-12-15 DIAGNOSIS — D696 Thrombocytopenia, unspecified: Secondary | ICD-10-CM | POA: Diagnosis not present

## 2022-12-15 DIAGNOSIS — I10 Essential (primary) hypertension: Secondary | ICD-10-CM | POA: Diagnosis not present

## 2022-12-15 DIAGNOSIS — D509 Iron deficiency anemia, unspecified: Secondary | ICD-10-CM | POA: Diagnosis not present

## 2022-12-15 DIAGNOSIS — E039 Hypothyroidism, unspecified: Secondary | ICD-10-CM | POA: Diagnosis not present

## 2023-01-14 ENCOUNTER — Ambulatory Visit (INDEPENDENT_AMBULATORY_CARE_PROVIDER_SITE_OTHER): Payer: Medicare PPO | Admitting: Orthopedic Surgery

## 2023-01-14 ENCOUNTER — Other Ambulatory Visit (INDEPENDENT_AMBULATORY_CARE_PROVIDER_SITE_OTHER): Payer: Medicare PPO

## 2023-01-14 ENCOUNTER — Encounter: Payer: Self-pay | Admitting: Orthopedic Surgery

## 2023-01-14 DIAGNOSIS — S72002A Fracture of unspecified part of neck of left femur, initial encounter for closed fracture: Secondary | ICD-10-CM

## 2023-01-14 DIAGNOSIS — M25562 Pain in left knee: Secondary | ICD-10-CM

## 2023-01-14 DIAGNOSIS — M1712 Unilateral primary osteoarthritis, left knee: Secondary | ICD-10-CM

## 2023-01-14 DIAGNOSIS — G8929 Other chronic pain: Secondary | ICD-10-CM

## 2023-01-14 MED ORDER — METHYLPREDNISOLONE ACETATE 40 MG/ML IJ SUSP
40.0000 mg | Freq: Once | INTRAMUSCULAR | Status: AC
Start: 2023-01-14 — End: 2023-01-14
  Administered 2023-01-14: 40 mg via INTRA_ARTICULAR

## 2023-01-14 NOTE — Progress Notes (Signed)
Follow-up visit  Problem #1 postop check left hip fracture bipolar replacement x-ray shows prosthesis is in good position good size etc.  Problem #2 new problem complains of left knee pain  X-rays show severe valgus osteoarthritis grade 4 end-stage disease  82 year old female presents as stated above for reevaluation left hip after bipolar replacement  She is not having any trouble with the hip itself but she is having trouble returning to her prefracture state of ambulation.  She also has cognitive issues was tried on Namenda with poor result and the Namenda was removed from her medication list as the family thought it was an hindering her  This did help in terms of her cognition and balance and gait  She is having difficulty with the left knee.  She was complaining of some mild to moderate symptoms prior to the fracture but since she has fractured her hip and had the surgery she still having difficulty with standing balance and weightbearing on the left side  The exam of the left knee reveals crepitance on range of motion but the range of motion is full she does have lateral joint line tenderness and valgus deformity.  There is no instability to the knee however.  X-ray again shows grade 4 arthritis  Recommend economy hinged brace by order (no extra smalls here) Injection left knee  Return 3 months I explained to the family that slow progress over the next year but doubt she will return to her prefracture state  Procedure note left knee injection   verbal consent was obtained to inject left knee joint  Timeout was completed to confirm the site of injection  The medications used were depomedrol 40 mg and 1% lidocaine 3 cc Anesthesia was provided by ethyl chloride and the skin was prepped with alcohol.  After cleaning the skin with alcohol a 20-gauge needle was used to inject the left knee joint. There were no complications. A sterile bandage was applied.

## 2023-02-14 ENCOUNTER — Ambulatory Visit: Payer: Medicare PPO | Admitting: Family Medicine

## 2023-03-15 ENCOUNTER — Ambulatory Visit: Payer: Medicare PPO | Admitting: Diagnostic Neuroimaging

## 2023-04-18 ENCOUNTER — Encounter: Payer: Self-pay | Admitting: Orthopedic Surgery

## 2023-04-18 ENCOUNTER — Ambulatory Visit: Payer: Medicare PPO | Admitting: Orthopedic Surgery

## 2023-04-18 ENCOUNTER — Ambulatory Visit: Payer: Medicare PPO | Admitting: Family Medicine

## 2023-04-18 VITALS — BP 143/56 | Ht 61.0 in | Wt 118.0 lb

## 2023-04-18 DIAGNOSIS — M1712 Unilateral primary osteoarthritis, left knee: Secondary | ICD-10-CM

## 2023-04-18 DIAGNOSIS — D696 Thrombocytopenia, unspecified: Secondary | ICD-10-CM | POA: Diagnosis not present

## 2023-04-18 DIAGNOSIS — S72002A Fracture of unspecified part of neck of left femur, initial encounter for closed fracture: Secondary | ICD-10-CM | POA: Diagnosis not present

## 2023-04-18 DIAGNOSIS — E039 Hypothyroidism, unspecified: Secondary | ICD-10-CM | POA: Diagnosis not present

## 2023-04-18 DIAGNOSIS — R413 Other amnesia: Secondary | ICD-10-CM

## 2023-04-18 DIAGNOSIS — R194 Change in bowel habit: Secondary | ICD-10-CM | POA: Diagnosis not present

## 2023-04-18 DIAGNOSIS — I10 Essential (primary) hypertension: Secondary | ICD-10-CM

## 2023-04-18 DIAGNOSIS — G8929 Other chronic pain: Secondary | ICD-10-CM

## 2023-04-18 MED ORDER — METHYLPREDNISOLONE ACETATE 40 MG/ML IJ SUSP
40.0000 mg | Freq: Once | INTRAMUSCULAR | Status: AC
Start: 2023-04-18 — End: 2023-04-18
  Administered 2023-04-18: 40 mg via INTRA_ARTICULAR

## 2023-04-18 MED ORDER — HYDROCHLOROTHIAZIDE 12.5 MG PO TABS
12.5000 mg | ORAL_TABLET | Freq: Every day | ORAL | 0 refills | Status: DC
Start: 2023-04-18 — End: 2023-06-01

## 2023-04-18 MED ORDER — METOPROLOL SUCCINATE ER 50 MG PO TB24
50.0000 mg | ORAL_TABLET | Freq: Every day | ORAL | 3 refills | Status: DC
Start: 2023-04-18 — End: 2023-06-01

## 2023-04-18 NOTE — Progress Notes (Unsigned)
Established patient visit   Patient: Lauren Lawrence   DOB: [birth year]   82 y.o. Female  MRN: [id number] Visit Date: 04/18/2023  Today's healthcare provider: Mila Merry, MD   Chief Complaint  Patient presents with   Follow-up   Subjective    Discussed the use of AI scribe software for clinical note transcription with the patient, who gave verbal consent to proceed.  History of Present Illness   The patient, with a history of hypothyroidism and memory issues, presents for a follow-up visit regarding medication adjustments. She reports persistent memory loss despite the initiation of Aricept and Namenda at her last visit. The patient's family noted increased confusion after starting memantine, which resolved upon discontinuation of the medication. The patient's family also reports considering discontinuing donepezil as her memory has not seemed to have improved since starting it.   The patient's thyroid medication was recently reduced after her last visit due to suppressed TSH. She reports no new symptoms since this adjustment. She denies any chest pain, heart flutters, or shortness of breath.  The patient has been experiencing chronic loose stools for the past couple of months, which typically occur shortly after eating. She denies any associated abdominal pain. The patient's family reports a long-standing issue with lower extremity swelling, which has progressively worsened over time despite the use of compression socks and lifestyle modifications. The patient was previously on amlodipine, which was discontinued due to concerns of low blood pressure and worsening edema. The effectiveness of this change is unclear as the swelling persists.  The patient's blood pressure was noted to be well-controlled on her current regimen of metoprolol and losartan. However, there are concerns that this medication may be contributing to the patient's memory issues. She had been on amlodipine 5mg  for  years, but was discontinued in 2022 due to edema. she had also been on hctz 25 for several years but apparently discontinued sometime in 2023 for unclear reasons.       Medications: Outpatient Medications Prior to Visit  Medication Sig   aspirin EC 81 MG tablet Take 81 mg by mouth daily. Swallow whole.   Coenzyme Q10 10 MG capsule Take 10 mg by mouth daily.   Ferrous Sulfate (IRON) 325 (65 Fe) MG TABS Take 1 tablet (325 mg total) by mouth daily.   levothyroxine (SYNTHROID) 112 MCG tablet Take 1 tablet (112 mcg total) by mouth daily.   losartan (COZAAR) 100 MG tablet Take 1 tablet (100 mg total) by mouth daily.   Multiple Vitamin (MULTIVITAMIN WITH MINERALS) TABS tablet Take 1 tablet by mouth daily.   risedronate (ACTONEL) 35 MG tablet Take 1 tablet (35 mg total) by mouth every 7 (seven) days. with water on empty stomach, nothing by mouth or lie down for next 30 minutes.   [DISCONTINUED] metoprolol succinate (TOPROL-XL) 100 MG 24 hr tablet Take 1 tablet (100 mg total) by mouth daily. Take with or immediately following a meal.   [DISCONTINUED] acetaminophen (TYLENOL) 325 MG tablet Take 2 tablets (650 mg total) by mouth every 6 (six) hours as needed for mild pain or fever.   No facility-administered medications prior to visit.      Objective    BP (!) 143/56 (BP Location: Left Arm, Patient Position: Sitting, Cuff Size: Normal)   Ht 5\' 1"  (1.549 m)   Wt 118 lb (53.5 kg)   BMI 22.30 kg/m   Physical Exam   General: Appearance:    Well developed, well nourished female in  no acute distress  Eyes:    PERRL, conjunctiva/corneas clear, EOM's intact       Lungs:     Clear to auscultation bilaterally, respirations unlabored  Heart:    Normal heart rate. Normal rhythm. No murmurs, rubs, or gallops.    MS:   All extremities are intact.    Neurologic:   Awake, alert, oriented x 3. No apparent focal neurological defect.         Assessment & Plan        MCI Patient was previously on  memantine and donepezil. Memantine was discontinued due to increased confusion. Donepezil was not effective and recently discontinued Counseled that betablockers may contribute to cognitive changes. will reduce metoprolol to 50mg  and start back on low dose hctz.   Hypertension Currently on metoprolol, which may be contributing to memory issues. Blood pressure control appears adequate. -Reduce metoprolol dose to 50 -Start hydrochlorothiazide 12.5 to help control blood pressure and potentially reduce lower extremity swelling. Previously on amlodipine 5mg  discontinued in 2022 due to edema.   Lower extremity edema Chronic issue, worsening over time despite use of compression socks and elevation. -Start hydrochlorothiazide, which may help reduce edema.  Hyperthyroidism Previous adjustment of thyroid medication due to suppressed TSH -Check thyroid function tests to assess current status.  Chronic Diarrhea Loose stools for the past couple of months, particularly after eating. No associated abdominal pain. -Order comprehensive metabolic panel and pancreatic enzymes to evaluate for potential causes. -Consider dietary modifications such as reducing lactose and gluten intake.    Return in about 6 weeks (around 05/30/2023).      Mila Merry, MD  [facility] [phone] (phone) [phone] (fax)  [facility]

## 2023-04-18 NOTE — Patient Instructions (Signed)
May repeat in 3 mos   You have received an injection of steroids into the joint. 15% of patients will have increased pain within the 24 hours postinjection.   This is transient and will go away.   We recommend that you use ice packs on the injection site for 20 minutes every 2 hours and extra strength Tylenol 2 tablets every 8 as needed until the pain resolves.  If you continue to have pain after taking the Tylenol and using the ice please call the office for further instructions.

## 2023-04-18 NOTE — Patient Instructions (Signed)
.   Please review the attached list of medications and notify my office if there are any errors.   . Please bring all of your medications to every appointment so we can make sure that our medication list is the same as yours.   

## 2023-04-18 NOTE — Addendum Note (Signed)
Addended by: Michaele Offer on: 04/18/2023 11:47 AM   Modules accepted: Orders

## 2023-04-18 NOTE — Progress Notes (Addendum)
FOLLOW-UP OFFICE VISIT   Patient: Lauren Lawrence           Date of Birth: 12-10-1940           MRN: [id number] Visit Date: 04/18/2023 Requested by: Malva Limes, MD [address] [place],  [place] [address] PCP: Malva Limes, MD    Encounter Diagnosis  Name Primary?   Displaced fracture of left femoral neck (HCC) 10/14/22 bipolar replacement Yes    Chief Complaint  Patient presents with   Knee Pain    Left knee pain; s/p orif left hip and injx left knee      82 year old female status post bipolar replacement left hip no problems with the hip at this time.  On the last visit she received an injection in her left knee for valgus osteoarthritis is seem to do well.  Seems to be the primary culprit of discomfort and limping after walking on the leg.  She got good relief from the left knee and after evaluation is agree to having another 1  Left knee is in valgus patient ambulatory with walker good get up and go as I observed  Lateral compartment tenderness no effusion  Slight flexion contracture Arc of flexion 115 degrees  No instability    ASSESSMENT AND PLAN Encounter Diagnoses  Name Primary?   Displaced fracture of left femoral neck (HCC) 10/14/22 bipolar replacement    Chronic pain of left knee    Primary osteoarthritis of left knee Yes    Procedure note left knee injection   verbal consent was obtained to inject left knee joint  Timeout was completed to confirm the site of injection  The medications used were depomedrol 40 mg and 1% lidocaine 3 cc Anesthesia was provided by ethyl chloride and the skin was prepped with alcohol.  After cleaning the skin with alcohol a 20-gauge needle was used to inject the left knee joint. There were no complications. A sterile bandage was applied.    F/u when she needs another

## 2023-04-19 LAB — CBC
Hematocrit: 37.1 % (ref 34.0–46.6)
Hemoglobin: 12.7 g/dL (ref 11.1–15.9)
MCH: 32.5 pg (ref 26.6–33.0)
MCHC: 34.2 g/dL (ref 31.5–35.7)
MCV: 95 fL (ref 79–97)
Platelets: 80 10*3/uL — CL (ref 150–450)
RBC: 3.91 x10E6/uL (ref 3.77–5.28)
RDW: 13.2 % (ref 11.7–15.4)
WBC: 2.3 10*3/uL — CL (ref 3.4–10.8)

## 2023-04-19 LAB — COMPREHENSIVE METABOLIC PANEL
ALT: 32 [IU]/L (ref 0–32)
AST: 49 [IU]/L — ABNORMAL HIGH (ref 0–40)
Albumin: 3.3 g/dL — ABNORMAL LOW (ref 3.7–4.7)
Alkaline Phosphatase: 95 [IU]/L (ref 44–121)
BUN/Creatinine Ratio: 23 (ref 12–28)
BUN: 14 mg/dL (ref 8–27)
Bilirubin Total: 0.9 mg/dL (ref 0.0–1.2)
CO2: 23 mmol/L (ref 20–29)
Calcium: 8.4 mg/dL — ABNORMAL LOW (ref 8.7–10.3)
Chloride: 109 mmol/L — ABNORMAL HIGH (ref 96–106)
Creatinine, Ser: 0.62 mg/dL (ref 0.57–1.00)
Globulin, Total: 2.2 g/dL (ref 1.5–4.5)
Glucose: 129 mg/dL — ABNORMAL HIGH (ref 70–99)
Potassium: 4.2 mmol/L (ref 3.5–5.2)
Sodium: 144 mmol/L (ref 134–144)
Total Protein: 5.5 g/dL — ABNORMAL LOW (ref 6.0–8.5)
eGFR: 89 mL/min/{1.73_m2} (ref >59)

## 2023-04-19 LAB — T4, FREE: Free T4: 1.36 ng/dL (ref 0.82–1.77)

## 2023-04-19 LAB — AMMONIA: Ammonia: 48 ug/dL (ref 28–135)

## 2023-04-19 LAB — TSH: TSH: 1.78 u[IU]/mL (ref 0.450–4.500)

## 2023-04-19 LAB — AMYLASE: Amylase: 38 U/L (ref 31–110)

## 2023-04-22 ENCOUNTER — Other Ambulatory Visit: Payer: Self-pay | Admitting: Family Medicine

## 2023-04-22 DIAGNOSIS — D696 Thrombocytopenia, unspecified: Secondary | ICD-10-CM

## 2023-04-22 DIAGNOSIS — D72819 Decreased white blood cell count, unspecified: Secondary | ICD-10-CM

## 2023-04-25 ENCOUNTER — Ambulatory Visit: Payer: Self-pay

## 2023-04-25 NOTE — Telephone Encounter (Signed)
 Chief Complaint: Question  Disposition: [] ED /[] Urgent Care (no appt availability in office) / [] Appointment(In office/virtual)/ []  [place] [facility]/ [] Home Care/ [] Refused Recommended Disposition /[] [place] [facility]/ [x]  Follow-up with PCP Additional Notes: Patient's daughter states she spoke with her sister  and they both would like to know if the ultrasound really would be beneficial for their mom with her current cognitive decline? Glenys stated that the patient has had low platelet and white blood cell counts for about 15 years and had several test done without a clear diagnosis or treatment plan. Glenys stated if PCP is not looking for something in particular that he believes he can treat and improve the patient's well-being they are not interested in doing mor test at this time. Glenys stated it is difficult to get the patient to appointments and don't feel it is worth it just to explore. Glenys would like to know exactly what PCP would be looking for during the ultrasound and if he believes it will be beneficial for the patient. Please advise.   Summary: additional testing info   Pt came in last week and the dr wanted to do some additional testing (like an ultrasound)  Daughter tanya would like a cb to discuss.       Reason for Disposition  [1] Caller requesting NON-URGENT health information AND [2] PCP's office is the best resource  Answer Assessment - Initial Assessment Questions 1. REASON FOR CALL or QUESTION: What is your reason for calling today? or How can I best help you? or What question do you have that I can help answer?     My mom has had low platelets and WBC for about 15 years. She has had multiple test done but never really got an answer. Do we really need to do the ultrasounds at this point due to the mental state of the patient.  Protocols used: Information Only Call - No Triage-A-AH

## 2023-04-25 NOTE — Addendum Note (Signed)
 Addended by: Malva Limes on: 04/25/2023 04:06 PM   Modules accepted: Orders

## 2023-04-25 NOTE — Telephone Encounter (Signed)
 It's not urgent. We can hold off for now. If her platelets drop below 40 then she would be at high risk for bleeding and hemorrhaging and we would need to be more aggressive. Since her platelets are still running around 80 we can just recheck it every 4 or 5 months. Have cancelled ultrasound order.

## 2023-04-26 NOTE — Telephone Encounter (Signed)
 Patient's daughter, Tanya(DPR), called and notified of provider response.

## 2023-04-26 NOTE — Telephone Encounter (Signed)
 Attempted to call back. No answer. Left VM to return call to office about ultrasound.  [name] you can advise of Dr. Theodis Aguas message as below.

## 2023-05-30 ENCOUNTER — Ambulatory Visit: Payer: Medicare PPO | Admitting: Family Medicine

## 2023-05-30 VITALS — BP 140/60 | HR 60 | Temp 97.8°F | Ht 61.0 in | Wt 115.0 lb

## 2023-05-30 DIAGNOSIS — R739 Hyperglycemia, unspecified: Secondary | ICD-10-CM

## 2023-05-30 DIAGNOSIS — D696 Thrombocytopenia, unspecified: Secondary | ICD-10-CM

## 2023-05-30 DIAGNOSIS — F0390 Unspecified dementia without behavioral disturbance: Secondary | ICD-10-CM

## 2023-05-30 DIAGNOSIS — I10 Essential (primary) hypertension: Secondary | ICD-10-CM | POA: Diagnosis not present

## 2023-05-30 NOTE — Progress Notes (Signed)
 Established patient visit   Patient: Lauren Lawrence   DOB: Nov 12, 1940   83 y.o. Female  MRN: [id number] Visit Date: 05/30/2023  Today's healthcare provider: Jeralene Mom, MD   Chief Complaint  Patient presents with   Hypertension    Patient was last seen 6 weeks ago.  At that time management changes were to decrease her Metoprolol  and restart hydrochlorothiazide .   Subjective    Hypertension Pertinent negatives include no chest pain, palpitations or shortness of breath.   Follow up since reducing metoprolol  and starting low dose hydrochlorothiazide  about 6 weeks ago due to progressive memory issue. She reports she feels fine, although hasn't noticed any change with memory. No side effects from hydrochlorothiazide , although she is not drinking as much water. Home SBP 130s - 150s.   Lab Results  Component Value Date   NA 144 04/18/2023   K 4.2 04/18/2023   CREATININE 0.62 04/18/2023   EGFR 89 04/18/2023   GLUCOSE 129 (H) 04/18/2023    Medications: Outpatient Medications Prior to Visit  Medication Sig   aspirin  EC 81 MG tablet Take 81 mg by mouth daily. Swallow whole.   Coenzyme Q10 10 MG capsule Take 10 mg by mouth daily.   Ferrous Sulfate (IRON ) 325 (65 Fe) MG TABS Take 1 tablet (325 mg total) by mouth daily.   hydrochlorothiazide  (HYDRODIURIL ) 12.5 MG tablet Take 1 tablet (12.5 mg total) by mouth daily.   levothyroxine  (SYNTHROID ) 112 MCG tablet Take 1 tablet (112 mcg total) by mouth daily.   losartan  (COZAAR ) 100 MG tablet Take 1 tablet (100 mg total) by mouth daily.   metoprolol  succinate (TOPROL -XL) 50 MG 24 hr tablet Take 1 tablet (50 mg total) by mouth daily. Take with or immediately following a meal.   Multiple Vitamin (MULTIVITAMIN WITH MINERALS) TABS tablet Take 1 tablet by mouth daily.   risedronate  (ACTONEL ) 35 MG tablet Take 1 tablet (35 mg total) by mouth every 7 (seven) days. with water on empty stomach, nothing by mouth or lie down for next 30 minutes.    No facility-administered medications prior to visit.    Review of Systems  Constitutional:  Negative for appetite change, chills, fatigue and fever.  Respiratory:  Negative for chest tightness and shortness of breath.   Cardiovascular:  Negative for chest pain and palpitations.  Gastrointestinal:  Negative for abdominal pain, nausea and vomiting.  Neurological:  Negative for dizziness and weakness.       Objective    BP (!) 140/60   Pulse 60   Temp 97.8 F (36.6 C) (Oral)   Ht 5\' 1"  (1.549 m)   Wt 115 lb (52.2 kg)   SpO2 100%   BMI 21.73 kg/m    Physical Exam  General appearance: Well developed, well nourished female, cooperative and in no acute distress Head: Normocephalic, without obvious abnormality, atraumatic Respiratory: Respirations even and unlabored, normal respiratory rate Extremities: All extremities are intact.  Skin: Skin color, texture, turgor normal. No rashes seen  Psych: Appropriate mood and affect. Neurologic: Mental status: Alert, oriented to person, place, and time, thought content appropriate.   Assessment & Plan     1. Essential (primary) hypertension (Primary) SBP up a bit since reducing betablocker and starting hydrochlorothiazide .   - Comprehensive metabolic panel - Magnesium  2. Dementia without behavioral disturbance (HCC) Stable, although no improvement with reduced dose of betablocker.   3. Thrombocytopenia (HCC) Stable. Family elected not to pursue additional work up or imaging for  the time being. Plan on rechecking in 3-4 months.   4. Hyperglycemia  - Hemoglobin A1c        Jeralene Mom, MD  [facility] [phone] (phone) [phone] (fax)  [facility]

## 2023-05-31 LAB — COMPREHENSIVE METABOLIC PANEL
ALT: 33 [IU]/L — ABNORMAL HIGH (ref 0–32)
AST: 54 [IU]/L — ABNORMAL HIGH (ref 0–40)
Albumin: 3.5 g/dL — ABNORMAL LOW (ref 3.7–4.7)
Alkaline Phosphatase: 84 [IU]/L (ref 44–121)
BUN/Creatinine Ratio: 23 (ref 12–28)
BUN: 14 mg/dL (ref 8–27)
Bilirubin Total: 1.6 mg/dL — ABNORMAL HIGH (ref 0.0–1.2)
CO2: 22 mmol/L (ref 20–29)
Calcium: 9.4 mg/dL (ref 8.7–10.3)
Chloride: 95 mmol/L — ABNORMAL LOW (ref 96–106)
Creatinine, Ser: 0.62 mg/dL (ref 0.57–1.00)
Globulin, Total: 2.5 g/dL (ref 1.5–4.5)
Glucose: 105 mg/dL — ABNORMAL HIGH (ref 70–99)
Potassium: 4.3 mmol/L (ref 3.5–5.2)
Sodium: 130 mmol/L — ABNORMAL LOW (ref 134–144)
Total Protein: 6 g/dL (ref 6.0–8.5)
eGFR: 89 mL/min/{1.73_m2} (ref >59)

## 2023-05-31 LAB — MAGNESIUM: Magnesium: 1.8 mg/dL (ref 1.6–2.3)

## 2023-05-31 LAB — HEMOGLOBIN A1C
Est. average glucose Bld gHb Est-mCnc: 105 mg/dL
Hgb A1c MFr Bld: 5.3 % (ref 4.8–5.6)

## 2023-06-01 ENCOUNTER — Encounter: Payer: Self-pay | Admitting: Family Medicine

## 2023-06-01 ENCOUNTER — Other Ambulatory Visit: Payer: Self-pay | Admitting: Family Medicine

## 2023-06-01 DIAGNOSIS — I10 Essential (primary) hypertension: Secondary | ICD-10-CM

## 2023-06-01 MED ORDER — METOPROLOL SUCCINATE ER 100 MG PO TB24: 100.0000 mg | ORAL_TABLET | Freq: Every day | ORAL | 2 refills | Status: DC

## 2023-07-11 ENCOUNTER — Other Ambulatory Visit: Payer: Self-pay | Admitting: Family Medicine

## 2023-07-11 DIAGNOSIS — I10 Essential (primary) hypertension: Secondary | ICD-10-CM

## 2023-07-11 NOTE — Telephone Encounter (Unsigned)
 Copied from CRM [id number]. Topic: Clinical - Medication Refill >> Jul 11, 2023  8:32 AM Franchot Heidelberg wrote: Most Recent Primary Care Visit:  Provider: Malva Limes  Department: [facility]  Visit Type: OFFICE VISIT  Date: 05/30/2023  Medication: losartan (COZAAR) 100 MG tablet  Has the patient contacted their pharmacy? Yes (Agent: If no, request that the patient contact the pharmacy for the refill. If patient does not wish to contact the pharmacy document the reason why and proceed with request.) (Agent: If yes, when and what did the pharmacy advise?)  Is this the correct pharmacy for this prescription? Yes If no, delete pharmacy and type the correct one.  This is the patient's preferred pharmacy:  [facility] [address] [place] - [address] [place] [address] Phone: [phone] Fax: [phone]   Has the prescription been filled recently? Yes  Is the patient out of the medication? No  Has the patient been seen for an appointment in the last year OR does the patient have an upcoming appointment? Yes  Can we respond through MyChart? Yes  Agent: Please be advised that Rx refills may take up to 3 business days. We ask that you follow-up with your pharmacy.

## 2023-07-12 MED ORDER — LOSARTAN POTASSIUM 100 MG PO TABS: 100.0000 mg | ORAL_TABLET | Freq: Every day | ORAL | 1 refills | Status: DC

## 2023-07-12 NOTE — Telephone Encounter (Signed)
 Requested medication (s) are due for refill today: na   Requested medication (s) are on the active medication list: yes   Last refill:  10/19/22 #90 1 refills  Future visit scheduled: no   Notes to clinic:  last ordered by Shon Hale, MD. Do you want to refill Rx?     Requested Prescriptions  Pending Prescriptions Disp Refills   losartan (COZAAR) 100 MG tablet 90 tablet 1    Sig: Take 1 tablet (100 mg total) by mouth daily.     Cardiovascular:  Angiotensin Receptor Blockers Failed - 07/12/2023 12:13 PM      Failed - Last BP in normal range    BP Readings from Last 1 Encounters:  05/30/23 (!) 140/60         Passed - Cr in normal range and within 180 days    Creatinine  Date Value Ref Range Status  07/22/2011 0.61 0.60 - 1.30 mg/dL Final   Creatinine, Ser  Date Value Ref Range Status  05/30/2023 0.62 0.57 - 1.00 mg/dL Final         Passed - K in normal range and within 180 days    Potassium  Date Value Ref Range Status  05/30/2023 4.3 3.5 - 5.2 mmol/L Final  07/22/2011 3.9 3.5 - 5.1 mmol/L Final         Passed - Patient is not pregnant      Passed - Valid encounter within last 6 months    Recent Outpatient Visits           2 months ago Acquired hypothyroidism   [place] [facility] Malva Limes, MD   7 months ago Acquired hypothyroidism   [place] [facility] Malva Limes, MD   11 months ago Essential (primary) hypertension   [facility] [facility] Malva Limes, MD   1 year ago Essential (primary) hypertension   [place] [facility] Malva Limes, MD   1 year ago Iron deficiency anemia, unspecified iron deficiency anemia type   [name], MD

## 2023-08-12 ENCOUNTER — Telehealth: Payer: Self-pay | Admitting: Family Medicine

## 2023-08-12 DIAGNOSIS — F0390 Unspecified dementia without behavioral disturbance: Secondary | ICD-10-CM

## 2023-08-12 NOTE — Telephone Encounter (Signed)
 Patient's daughter Bertell Broach advised and agreeable to referral. Referral placed.

## 2023-08-12 NOTE — Telephone Encounter (Signed)
 Recommend referral to [facility]

## 2023-08-12 NOTE — Telephone Encounter (Signed)
 Copied from CRM [id number]. Topic: Clinical - Medical Advice >> Aug 12, 2023  2:09 PM Everlene Hobby D wrote: Patient's daughter is calling wanting to talk to nurse about getting a official diagnosis or if it's necessary because she's mentally declining and her pcp knows. Bertell Broach- [phone]  Bertell Broach states she wants a specific diagnosis of alzheimer's.  States she is gradually reclining and would like the diagnosis so they can get extra help.  Would like to know if she needs to see a neurologist. Office to call Tanya back.

## 2023-08-12 NOTE — Addendum Note (Signed)
 Addended by: Rachelanne Whidby E on: 08/12/2023 04:52 PM   Modules accepted: Orders

## 2023-09-02 ENCOUNTER — Ambulatory Visit: Admitting: Family Medicine

## 2023-09-02 ENCOUNTER — Encounter: Payer: Self-pay | Admitting: Family Medicine

## 2023-09-02 VITALS — BP 209/57 | HR 51 | Ht 61.0 in | Wt 98.0 lb

## 2023-09-02 DIAGNOSIS — R6881 Early satiety: Secondary | ICD-10-CM

## 2023-09-02 DIAGNOSIS — R1319 Other dysphagia: Secondary | ICD-10-CM

## 2023-09-02 DIAGNOSIS — E039 Hypothyroidism, unspecified: Secondary | ICD-10-CM | POA: Diagnosis not present

## 2023-09-02 DIAGNOSIS — F329 Major depressive disorder, single episode, unspecified: Secondary | ICD-10-CM

## 2023-09-02 DIAGNOSIS — R634 Abnormal weight loss: Secondary | ICD-10-CM

## 2023-09-02 DIAGNOSIS — F0390 Unspecified dementia without behavioral disturbance: Secondary | ICD-10-CM

## 2023-09-02 NOTE — Progress Notes (Signed)
 Established patient visit   Patient: Lauren Lawrence   DOB: 11-20-1940   83 y.o. Female  MRN: [id number] Visit Date: 09/02/2023  Today's healthcare provider: Mimi Alt, MD   Chief Complaint  Patient presents with   Weight Loss    She has lost 17lbs since Feb she eats one or two bites than she states she is full    Subjective     HPI     Weight Loss    Additional comments: She has lost 17lbs since Feb she eats one or two bites than she states she is full       Last edited by Bart Lieu, CMA on 09/02/2023  3:26 PM.       Discussed the use of AI scribe software for clinical note transcription with the patient, who gave verbal consent to proceed.  History of Present Illness Lauren Lawrence is an 83 year old female with dementia, hypothyroidism, and hypertension who presents with unintentional weight loss and early satiety.  She has experienced unintentional weight loss and early satiety over the past month, feeling full after consuming small amounts. Her weight has decreased from 115 pounds in February to 98 pounds currently. Despite efforts to increase caloric intake, she continues to eat very little, often expressing concern about vomiting if she eats more. She manages to eat a small Activia yogurt in the morning if prompted but struggles with larger meals throughout the day.  Her daughter reports increased difficulty swallowing over the past six months, particularly with pills, which sometimes feel like they get stuck in her chest. There is no nausea, diarrhea, or changes in bowel habits recently, although she has had diarrhea in the past. Her daughter has not observed any blood in her stool. There have been no changes in her baseline dementia symptoms, and her daughter reports no changes in her behavior or processing abilities.  Her daughter mentions that she had dental work done about a month and a half ago and was on amoxicillin for ten days, after which  the appetite issues began. There is concern that this may have affected her gut flora, contributing to her current symptoms.     Past Medical History:  Diagnosis Date   Depression    Hyperlipidemia    Hypertension    Memory loss     Medications: Outpatient Medications Prior to Visit  Medication Sig   aspirin  EC 81 MG tablet Take 81 mg by mouth daily. Swallow whole.   Coenzyme Q10 10 MG capsule Take 10 mg by mouth daily.   Ferrous Sulfate (IRON ) 325 (65 Fe) MG TABS Take 1 tablet (325 mg total) by mouth daily.   levothyroxine  (SYNTHROID ) 112 MCG tablet Take 1 tablet (112 mcg total) by mouth daily.   losartan  (COZAAR ) 100 MG tablet Take 1 tablet (100 mg total) by mouth daily.   metoprolol  succinate (TOPROL -XL) 100 MG 24 hr tablet Take 1 tablet (100 mg total) by mouth daily. Take with or immediately following a meal.   Multiple Vitamin (MULTIVITAMIN WITH MINERALS) TABS tablet Take 1 tablet by mouth daily.   risedronate  (ACTONEL ) 35 MG tablet Take 1 tablet (35 mg total) by mouth every 7 (seven) days. with water on empty stomach, nothing by mouth or lie down for next 30 minutes.   No facility-administered medications prior to visit.    Review of Systems  Last CBC Lab Results  Component Value Date   WBC 2.3 (LL) 04/18/2023  HGB 12.7 04/18/2023   HCT 37.1 04/18/2023   MCV 95 04/18/2023   MCH 32.5 04/18/2023   RDW 13.2 04/18/2023   PLT 80 (LL) 04/18/2023   Last metabolic panel Lab Results  Component Value Date   GLUCOSE 105 (H) 05/30/2023   NA 130 (L) 05/30/2023   K 4.3 05/30/2023   CL 95 (L) 05/30/2023   CO2 22 05/30/2023   BUN 14 05/30/2023   CREATININE 0.62 05/30/2023   EGFR 89 05/30/2023   CALCIUM 9.4 05/30/2023   PHOS 3.1 10/14/2022   PROT 6.0 05/30/2023   ALBUMIN 3.5 (L) 05/30/2023   LABGLOB 2.5 05/30/2023   AGRATIO 1.4 08/16/2022   BILITOT 1.6 (H) 05/30/2023   ALKPHOS 84 05/30/2023   AST 54 (H) 05/30/2023   ALT 33 (H) 05/30/2023   ANIONGAP 7 10/17/2022    Last lipids Lab Results  Component Value Date   CHOL 149 12/12/2018   HDL 58 12/12/2018   LDLCALC 75 12/12/2018   TRIG 82 12/12/2018   CHOLHDL 2.6 12/12/2018   Last hemoglobin A1c Lab Results  Component Value Date   HGBA1C 5.3 05/30/2023   Last thyroid  functions Lab Results  Component Value Date   TSH 1.780 04/18/2023   Last vitamin D  Lab Results  Component Value Date   VD25OH 55.5 11/19/2022   Last vitamin B12 and Folate Lab Results  Component Value Date   VITAMINB12 1,370 (H) 11/19/2022   FOLATE 7.7 05/11/2021        Objective    BP (!) 209/57   Pulse (!) 51   Ht 5\' 1"  (1.549 m)   Wt 98 lb (44.5 kg)   SpO2 100%   BMI 18.52 kg/m   BP Readings from Last 3 Encounters:  09/02/23 (!) 209/57  05/30/23 (!) 140/60  04/18/23 (!) 143/56   Wt Readings from Last 3 Encounters:  09/02/23 98 lb (44.5 kg)  05/30/23 115 lb (52.2 kg)  04/18/23 118 lb (53.5 kg)        Physical Exam  Physical Exam VITALS: BP- 200/60 MEASUREMENTS: Weight- 98, BMI- 18.0. GENERAL: Appears dehydrated. EXTREMITIES: Bluish tint to nails. CARD: bradycardia, no murmur PULM: CTAB     No results found for any visits on 09/02/23.  Assessment & Plan     Problem List Items Addressed This Visit       Endocrine   Acquired hypothyroidism   Relevant Orders   TSH+T4F+T3Free     Nervous and Auditory   Dementia without behavioral disturbance (HCC)   Relevant Orders   CMP14+EGFR   Hemoglobin A1c   CBC   TSH+T4F+T3Free   Vitamin B12   Folate     Other   Clinical depression   Relevant Orders   CBC   TSH+T4F+T3Free   Other Visit Diagnoses       Unintentional weight loss of more than 10% body weight within 6 months    -  Primary   Relevant Orders   DG Chest 2 View     Early satiety       Relevant Orders   CMP14+EGFR   Hemoglobin A1c   CBC   TSH+T4F+T3Free   Vitamin B12   Folate   DG Abd 1 View   DG Chest 2 View     Esophageal dysphagia           Assessment &  Plan Unintentional weight loss Significant weight loss from 115 lbs in February to 98 lbs currently, with early satiety and dysphagia as contributing  factors. Possible multifactorial etiology including esophageal dysphagia, hypothyroidism, and dementia. Recent amoxicillin use may have affected gut flora, impacting appetite. Concerns about dehydration affecting blood pressure. Plan to avoid hospitalization to prevent potential delirium. - Order complete metabolic panel, CBC, thyroid  studies, vitamin B12, folate levels, and A1c. - Order abdominal x-ray to assess for obstruction or constipation. - Order chest x-ray to evaluate for structural abnormalities or potential heart failure. - Refer to Dr. Shann Darnel for follow-up as soon as possible. - Monitor blood pressure at home.  Early satiety Early satiety with minimal food intake before feeling full, possibly due to esophageal dysphagia. No significant nausea or abdominal pain. Concerns about potential malnutrition due to reduced intake.  Dysphagia Difficulty swallowing, particularly with pills, suggesting esophageal dysphagia. No significant choking or aspiration.  Hypertension, Chronic Elevated blood pressure, possibly exacerbated by dehydration. Current medications include losartan  100 mg and metoprolol  100 mg. Dehydration may contribute to elevated blood pressure as a compensatory mechanism.  Hypothyroidism,Chronic On Synthroid  112 mcg. Thyroid  function to be reassessed with lab work. - Update thyroid  studies.  Dementia without behavioral disturbance Dementia diagnosis with no significant changes in behavior or cognitive function. Appetite changes may relate to dementia progression, impacting eating behavior.  Iron  deficiency anemia Iron  deficiency anemia noted. No current symptoms suggestive of anemia.  Depression Depression noted. Depression in older adults can present with symptoms overlapping with other conditions.   Return in about 1  month (around 10/03/2023) for unintentional weight loss f/u .      Mimi Alt, MD  [facility] [phone] (phone) [phone] (fax)  [facility]

## 2023-09-03 LAB — CMP14+EGFR
ALT: 41 IU/L — ABNORMAL HIGH (ref 0–32)
AST: 56 IU/L — ABNORMAL HIGH (ref 0–40)
Albumin: 3.6 g/dL — ABNORMAL LOW (ref 3.7–4.7)
Alkaline Phosphatase: 77 IU/L (ref 44–121)
BUN/Creatinine Ratio: 16 (ref 12–28)
BUN: 10 mg/dL (ref 8–27)
Bilirubin Total: 2 mg/dL — ABNORMAL HIGH (ref 0.0–1.2)
CO2: 26 mmol/L (ref 20–29)
Calcium: 9.9 mg/dL (ref 8.7–10.3)
Chloride: 103 mmol/L (ref 96–106)
Creatinine, Ser: 0.61 mg/dL (ref 0.57–1.00)
Globulin, Total: 2.4 g/dL (ref 1.5–4.5)
Glucose: 119 mg/dL — ABNORMAL HIGH (ref 70–99)
Potassium: 3.6 mmol/L (ref 3.5–5.2)
Sodium: 141 mmol/L (ref 134–144)
Total Protein: 6 g/dL (ref 6.0–8.5)
eGFR: 89 mL/min/{1.73_m2} (ref >59)

## 2023-09-03 LAB — CBC
Hematocrit: 42.3 % (ref 34.0–46.6)
Hemoglobin: 14.7 g/dL (ref 11.1–15.9)
MCH: 33.2 pg — ABNORMAL HIGH (ref 26.6–33.0)
MCHC: 34.8 g/dL (ref 31.5–35.7)
MCV: 96 fL (ref 79–97)
Platelets: 89 10*3/uL — CL (ref 150–450)
RBC: 4.43 x10E6/uL (ref 3.77–5.28)
RDW: 14.2 % (ref 11.7–15.4)
WBC: 4.8 10*3/uL (ref 3.4–10.8)

## 2023-09-03 LAB — TSH+T4F+T3FREE
Free T4: 2.31 ng/dL — ABNORMAL HIGH (ref 0.82–1.77)
T3, Free: 2.7 pg/mL (ref 2.0–4.4)
TSH: 0.06 u[IU]/mL — ABNORMAL LOW (ref 0.450–4.500)

## 2023-09-03 LAB — FOLATE: Folate: >20 ng/mL (ref >3.0)

## 2023-09-03 LAB — HEMOGLOBIN A1C
Est. average glucose Bld gHb Est-mCnc: 94 mg/dL
Hgb A1c MFr Bld: 4.9 % (ref 4.8–5.6)

## 2023-09-03 LAB — VITAMIN B12: Vitamin B-12: >2000 pg/mL — ABNORMAL HIGH (ref 232–1245)

## 2023-09-05 ENCOUNTER — Ambulatory Visit: Payer: Self-pay | Admitting: Family Medicine

## 2023-09-05 DIAGNOSIS — E039 Hypothyroidism, unspecified: Secondary | ICD-10-CM

## 2023-09-05 DIAGNOSIS — R634 Abnormal weight loss: Secondary | ICD-10-CM

## 2023-09-05 MED ORDER — LEVOTHYROXINE SODIUM 100 MCG PO TABS: 100.0000 ug | ORAL_TABLET | Freq: Every day | ORAL | 1 refills | Status: DC

## 2023-09-05 NOTE — Telephone Encounter (Signed)
 Copied from CRM [id number]. Topic: Clinical - Medical Advice >> Sep 05, 2023  9:47 AM Lizabeth Riggs wrote: Reason for CRM:  Please have Dr. Evans Him nurse to call daughter, Bertell Broach, at [phone]. She has questions about mom might need to have testing done on her liver. Symptoms, diarrhea, not wanting to eat, losing weight, weak. She has questions about her blood work.

## 2023-09-06 ENCOUNTER — Ambulatory Visit
Admission: RE | Admit: 2023-09-06 | Discharge: 2023-09-06 | Disposition: A | Discharge status: Home / Self Care | Source: Ambulatory Visit | Attending: Family Medicine | Admitting: Family Medicine

## 2023-09-06 ENCOUNTER — Ambulatory Visit
Admission: RE | Admit: 2023-09-06 | Discharge: 2023-09-06 | Disposition: A | Discharge status: Home / Self Care | Attending: Family Medicine | Admitting: Family Medicine

## 2023-09-06 DIAGNOSIS — R634 Abnormal weight loss: Secondary | ICD-10-CM

## 2023-09-06 DIAGNOSIS — R6881 Early satiety: Secondary | ICD-10-CM | POA: Diagnosis present

## 2023-09-07 ENCOUNTER — Encounter: Payer: Self-pay | Admitting: Family Medicine

## 2023-09-07 NOTE — Telephone Encounter (Unsigned)
 Copied from CRM [id number]. Topic: Clinical - Lab/Test Results >> Sep 07, 2023 11:54 AM Everlene Hobby D wrote: Patient's daughter Lynnie Saucier is calling to get the results of the the xray for abdominal  [id number]

## 2023-09-07 NOTE — Telephone Encounter (Signed)
 Please see the pt daughter request

## 2023-09-15 NOTE — Progress Notes (Signed)
 Spoke with daughter, made aware of results and verbalized understanding.

## 2023-09-20 ENCOUNTER — Ambulatory Visit: Payer: Self-pay | Admitting: *Deleted

## 2023-09-20 DIAGNOSIS — R630 Anorexia: Secondary | ICD-10-CM

## 2023-09-20 NOTE — Telephone Encounter (Signed)
 Patient daughter Lauren Lawrence , on [place] called in and call disconnected. NT called daughter back to complete triage and daughter not with patient at this time.    Chief Complaint: weight loss patient not eating , requesting suggestion if any medication / recommendations to promote patient to eat Symptoms: weight loss from 125- 98 lbs. Not eating more than couple of bites in the past few months. Only drinks sip of liquids.  Reported some swallowing issues but not significant . Reports feeling full after 1 bite of food or liquid. Hx dementia  Frequency: couple of months  Pertinent Negatives: Patient denies falling no dizziness or balance issues reported. No vomiting reported.  Disposition: [] ED /[] Urgent Care (no appt availability in office) / [] Appointment(In office/virtual)/ []  [place] [facility]/ [] Home Care/ [] Refused Recommended Disposition /[] [place] [facility]/ [x]  Follow-up with PCP Additional Notes:   Recommended to try supplements of ensure, boost or other brands and mix with ice cream or small meals to supplement since patient does not want to eat . Future visit 10/05/23. US  ordered since last OV 09/02/23. Please advise what patient can eat or drink to help nutrition. Daughter concerned patient will be worse by 10/05/23. Please advise.    Copied from CRM [id number]. Topic: Clinical - Red Word Triage >> Sep 20, 2023  8:04 AM Elle L wrote: Red Word that prompted transfer to Nurse Triage: The patient's daughter, Lauren Lawrence, requested to speak to a Nurse because the patient has not been eating for a month or a two and is rapidly losing weight and she is experiencing extreme weakness. Reason for Disposition  SEVERE weight loss (e.g., BMI < 15; weight loss that is rapid; taking little or no food by mouth)  Answer Assessment - Initial Assessment Questions 1. MAIN CONCERN: "What is your main concern today?"     Not eating and feels full after a bite of food and couple of sips of liquid per daughter on DPR  Tanya 2. WEIGHT LOSS: "How much weight have you lost?"  (e.g., lbs., kgs.)  "Over what period of time have you lost this weight?"  (e.g., number of days, weeks, months, years)     Couple of months  3. BASELINE WEIGHT: "What is your baseline or normal weight?" (e.g., "How much do you usually weigh?")     125 lbs. Now weighs approx 98 lbs 4. CAUSE: "What do you think is causing the weight loss?" (e.g., depression, anxiety, medicine side effect, pain, trouble swallowing, substance or alcohol use problem, eating disorder)     Possible swallowing issues at times but for the most part can swallow  5. PRIOR EVALUATION: "Have you been evaluated by a doctor for your weight loss?" If Yes, ask "When was your last visit?" "What did your doctor (or NP/PA) tell you about the possible cause?"     Yes  and PCP will f/u 6. HEART FAILURE TREATMENT: "Do you have heart failure?" If Yes, ask: "Have you taken new or extra water pills (diuretics) recently?" (e.g., furosemide; bumetanide). "What is your target weight?"     See hx  7. OTHER SYMPTOMS: "Do you have any other symptoms?" (e.g., anxiety or depression, blood in stool, breathing difficulty, diarrhea, fever, trouble swallowing)     Dementia  8. PREGNANCY: "Is there any chance you are pregnant?" "When was your last menstrual period?"     na  Protocols used: Weight Loss - Unintended-A-AH

## 2023-09-21 MED ORDER — CYPROHEPTADINE HCL 4 MG PO TABS: 4.0000 mg | ORAL_TABLET | Freq: Three times a day (TID) | ORAL | 0 refills | Status: DC | PRN

## 2023-09-21 NOTE — Addendum Note (Signed)
 Addended by: Lamon Pillow on: 09/21/2023 04:43 PM   Modules accepted: Orders

## 2023-09-21 NOTE — Telephone Encounter (Signed)
 Have sent prescription for periactin to [facility] to take to help stimulate appetite. Encourage to drink 2-3 BOOST or Ensure daily in addition to regular meals.

## 2023-09-22 NOTE — Telephone Encounter (Signed)
 Pt daughter advised. Verbalized understanding

## 2023-09-28 ENCOUNTER — Ambulatory Visit
Admission: RE | Admit: 2023-09-28 | Discharge: 2023-09-28 | Disposition: A | Discharge status: Home / Self Care | Source: Ambulatory Visit | Attending: Family Medicine | Admitting: Family Medicine

## 2023-09-28 DIAGNOSIS — R634 Abnormal weight loss: Secondary | ICD-10-CM | POA: Insufficient documentation

## 2023-10-05 ENCOUNTER — Ambulatory Visit: Admitting: Family Medicine

## 2023-10-09 ENCOUNTER — Ambulatory Visit: Payer: Self-pay | Admitting: Family Medicine

## 2023-10-09 DIAGNOSIS — K7460 Unspecified cirrhosis of liver: Secondary | ICD-10-CM

## 2023-10-10 NOTE — Telephone Encounter (Signed)
 Copied from CRM [id number]. Topic: General - Other >> Oct 10, 2023  8:59 AM Cleave MATSU wrote: Reason for CRM: mom wants to go ahead and schedule with the GI specialist please follow up with daughter Glenys Seip about getting that scheduled.

## 2023-10-10 NOTE — Telephone Encounter (Signed)
 Referral placed.

## 2023-11-02 ENCOUNTER — Ambulatory Visit: Payer: Self-pay | Admitting: Family Medicine

## 2023-11-02 ENCOUNTER — Encounter: Payer: Self-pay | Admitting: Family Medicine

## 2023-11-02 VITALS — BP 199/70 | HR 54 | Wt 90.6 lb

## 2023-11-02 DIAGNOSIS — I10 Essential (primary) hypertension: Secondary | ICD-10-CM | POA: Diagnosis not present

## 2023-11-02 DIAGNOSIS — E559 Vitamin D deficiency, unspecified: Secondary | ICD-10-CM | POA: Diagnosis not present

## 2023-11-02 DIAGNOSIS — R634 Abnormal weight loss: Secondary | ICD-10-CM | POA: Diagnosis not present

## 2023-11-02 DIAGNOSIS — K7460 Unspecified cirrhosis of liver: Secondary | ICD-10-CM

## 2023-11-02 DIAGNOSIS — E039 Hypothyroidism, unspecified: Secondary | ICD-10-CM | POA: Diagnosis not present

## 2023-11-02 DIAGNOSIS — D696 Thrombocytopenia, unspecified: Secondary | ICD-10-CM

## 2023-11-02 NOTE — Progress Notes (Signed)
 Established patient visit   Patient: Lauren Lawrence   DOB: 06-Nov-1940   83 y.o. Female  MRN: [id number] Visit Date: 11/02/2023  Today's healthcare provider: Nancyann Perry, MD   Chief Complaint  Patient presents with   Follow-up    1 month follow-up unintentional weight loss f/u .    Subjective    HPI Discussed the use of AI scribe software for clinical note transcription with the patient, who gave verbal consent to proceed.  History of Present Illness   Lauren Lawrence is an 83 year old female who presents with unexplained weight loss.  She has been experiencing unexplained weight loss despite a reduction in her levothyroxine  dose from 112 mcg to 100 mcg daily in may. This adjustment was made after a previous workup revealed a low TSH level of 0.060 and an elevated free T4 level of 2.31. Despite the dose reduction, her weight loss persists.  Her appetite has significantly changed since mid-April, following the initiation of amoxicillin for dental work. Initially, she could only consume one or two bites per meal, but her intake has slightly improved to sometimes eight or ten bites. She describes eating as a struggle, feeling full after just two bites.  No stomach pain, nausea, or changes in bowel movements, with bowel movements occurring every day or every couple of days. She experiences shortness of breath only after long walks, which resolves quickly.  Her current medications include levothyroxine  100 mcg daily, iron  supplements, and Actonel , which she has been taking for about a year. She does not take a B12 supplement, although her B12 levels are high and she takes a multivitamin.  An abdominal ultrasound showed a nodular liver and mild right hydronephrosis, but was otherwise normal. Her blood work showed a high B12 level over 2000, normal folate, red and white blood cell counts, hemoglobin A1c, and an unremarkable complete metabolic panel. She has had thrombocytopenia for the last  few years and referral to GI has been placed for possible hepatic cirrhoisis.     Lab Results  Component Value Date   TSH 0.060 (L) 09/02/2023   Wt Readings from Last 3 Encounters:  11/02/23 90 lb 9.6 oz (41.1 kg)  09/02/23 98 lb (44.5 kg)  05/30/23 115 lb (52.2 kg)     Medications: Outpatient Medications Prior to Visit  Medication Sig Note   Coenzyme Q10 10 MG capsule Take 10 mg by mouth daily.    Ferrous Sulfate (IRON ) 325 (65 Fe) MG TABS Take 1 tablet (325 mg total) by mouth daily.    levothyroxine  (SYNTHROID ) 100 MCG tablet Take 1 tablet (100 mcg total) by mouth daily.    losartan  (COZAAR ) 100 MG tablet Take 1 tablet (100 mg total) by mouth daily.    metoprolol  succinate (TOPROL -XL) 100 MG 24 hr tablet Take 1 tablet (100 mg total) by mouth daily. Take with or immediately following a meal.    Multiple Vitamin (MULTIVITAMIN WITH MINERALS) TABS tablet Take 1 tablet by mouth daily.    risedronate  (ACTONEL ) 35 MG tablet Take 1 tablet (35 mg total) by mouth every 7 (seven) days. with water on empty stomach, nothing by mouth or lie down for next 30 minutes.    [DISCONTINUED] cyproheptadine  (PERIACTIN ) 4 MG tablet Take 1 tablet (4 mg total) by mouth 3 (three) times daily as needed (FOR APPETITE). 11/02/2023: never started   [DISCONTINUED] aspirin  EC 81 MG tablet Take 81 mg by mouth daily. Swallow whole. (Patient not taking: Reported  on 11/02/2023)    No facility-administered medications prior to visit.    Review of Systems  Constitutional:  Positive for appetite change. Negative for chills, fatigue and fever.  Respiratory:  Negative for chest tightness and shortness of breath.   Cardiovascular:  Negative for chest pain and palpitations.  Gastrointestinal:  Negative for abdominal pain, blood in stool, constipation, diarrhea, nausea and vomiting.  Neurological:  Negative for dizziness and weakness.       Objective    BP (!) 199/70 (BP Location: Left Arm, Patient Position: Sitting,  Cuff Size: Small)   Pulse (!) 54   Wt 90 lb 9.6 oz (41.1 kg)   SpO2 98%   BMI 17.12 kg/m    Physical Exam   General: Appearance:    Thin female in no acute distress  Eyes:    PERRL, conjunctiva/corneas clear, EOM's intact       Lungs:     Clear to auscultation bilaterally, respirations unlabored  Heart:    Bradycardic. Normal rhythm. No murmurs, rubs, or gallops.    MS:   All extremities are intact.    Neurologic:   Awake, alert, oriented x 3. No apparent focal neurological defect.          Assessment & Plan       Unexplained Weight Loss Persistent weight loss despite reduced levothyroxine . Appetite changes possibly linked to antibiotics. Differential includes medication side effects, thyroid  dysfunction, and liver disease. Thyroid  dysfunction unlikely sole cause due to increased appetite. Liver disease may affect medication metabolism. - Recheck thyroid  levels. - Stop Actonel  temporarily. - Referral to gastroenterologist for liver evaluation.  Thyroid  Dysfunction Previous labs indicated excessive thyroid  replacement. Levothyroxine  dose reduced. Reassess thyroid  function for further adjustments. - Recheck thyroid  levels.  Liver Disease Ultrasound showed nodular liver, suggesting fibrosis or mild cirrhosis. May affect medication metabolism, contributing to weight loss and high B12 levels. Progression unpredictable. - Referral to gastroenterologist for liver evaluation is in progress.  High Vitamin B12 Levels B12 levels over 2000,  No B12 supplementation beyond multivitamin.             Nancyann Perry, MD  [facility] [phone] (phone) [phone] (fax)  [facility]

## 2023-11-02 NOTE — Patient Instructions (Signed)
 Please review the attached list of medications and notify my office if there are any errors.   Stop taking the Actonel  (risedronate ) for the time being.

## 2023-11-03 ENCOUNTER — Ambulatory Visit: Payer: Self-pay | Admitting: Family Medicine

## 2023-11-03 LAB — VITAMIN D 25 HYDROXY (VIT D DEFICIENCY, FRACTURES): Vit D, 25-Hydroxy: 65.2 ng/mL (ref 30.0–100.0)

## 2023-11-03 LAB — IRON,TIBC AND FERRITIN PANEL
Ferritin: 520 ng/mL — ABNORMAL HIGH (ref 15–150)
Iron Saturation: 85 % (ref 15–55)
Iron: 187 ug/dL — ABNORMAL HIGH (ref 27–139)
Total Iron Binding Capacity: 220 ug/dL — ABNORMAL LOW (ref 250–450)
UIBC: 33 ug/dL — ABNORMAL LOW (ref 118–369)

## 2023-11-03 LAB — T4 AND TSH
T4, Total: 11.7 ug/dL (ref 4.5–12.0)
TSH: 0.021 u[IU]/mL — ABNORMAL LOW (ref 0.450–4.500)

## 2023-11-08 ENCOUNTER — Other Ambulatory Visit: Payer: Self-pay | Admitting: Family Medicine

## 2023-11-08 DIAGNOSIS — E039 Hypothyroidism, unspecified: Secondary | ICD-10-CM

## 2023-11-20 ENCOUNTER — Emergency Department (HOSPITAL_COMMUNITY)

## 2023-11-20 ENCOUNTER — Inpatient Hospital Stay (HOSPITAL_COMMUNITY)
Admission: EM | Admit: 2023-11-20 | Discharge: 2023-11-28 | DRG: 480 | Disposition: A | Discharge status: Skilled Nursing Facility | Attending: Family Medicine | Admitting: Family Medicine

## 2023-11-20 ENCOUNTER — Encounter (HOSPITAL_COMMUNITY): Payer: Self-pay

## 2023-11-20 ENCOUNTER — Other Ambulatory Visit: Payer: Self-pay

## 2023-11-20 DIAGNOSIS — Z9181 History of falling: Secondary | ICD-10-CM

## 2023-11-20 DIAGNOSIS — R7989 Other specified abnormal findings of blood chemistry: Secondary | ICD-10-CM

## 2023-11-20 DIAGNOSIS — E43 Unspecified severe protein-calorie malnutrition: Secondary | ICD-10-CM | POA: Insufficient documentation

## 2023-11-20 DIAGNOSIS — S72001A Fracture of unspecified part of neck of right femur, initial encounter for closed fracture: Principal | ICD-10-CM

## 2023-11-20 DIAGNOSIS — E860 Dehydration: Secondary | ICD-10-CM | POA: Diagnosis present

## 2023-11-20 DIAGNOSIS — R339 Retention of urine, unspecified: Secondary | ICD-10-CM | POA: Diagnosis not present

## 2023-11-20 DIAGNOSIS — Z79890 Hormone replacement therapy (postmenopausal): Secondary | ICD-10-CM

## 2023-11-20 DIAGNOSIS — E785 Hyperlipidemia, unspecified: Secondary | ICD-10-CM | POA: Diagnosis present

## 2023-11-20 DIAGNOSIS — E8809 Other disorders of plasma-protein metabolism, not elsewhere classified: Secondary | ICD-10-CM | POA: Diagnosis present

## 2023-11-20 DIAGNOSIS — M810 Age-related osteoporosis without current pathological fracture: Secondary | ICD-10-CM | POA: Diagnosis not present

## 2023-11-20 DIAGNOSIS — W010XXA Fall on same level from slipping, tripping and stumbling without subsequent striking against object, initial encounter: Secondary | ICD-10-CM | POA: Diagnosis present

## 2023-11-20 DIAGNOSIS — E039 Hypothyroidism, unspecified: Secondary | ICD-10-CM

## 2023-11-20 DIAGNOSIS — E871 Hypo-osmolality and hyponatremia: Secondary | ICD-10-CM

## 2023-11-20 DIAGNOSIS — Z8781 Personal history of (healed) traumatic fracture: Secondary | ICD-10-CM

## 2023-11-20 DIAGNOSIS — R296 Repeated falls: Secondary | ICD-10-CM | POA: Diagnosis present

## 2023-11-20 DIAGNOSIS — Z79899 Other long term (current) drug therapy: Secondary | ICD-10-CM | POA: Diagnosis not present

## 2023-11-20 DIAGNOSIS — S72141A Displaced intertrochanteric fracture of right femur, initial encounter for closed fracture: Principal | ICD-10-CM | POA: Diagnosis present

## 2023-11-20 DIAGNOSIS — F03A3 Unspecified dementia, mild, with mood disturbance: Secondary | ICD-10-CM | POA: Diagnosis not present

## 2023-11-20 DIAGNOSIS — F32A Depression, unspecified: Secondary | ICD-10-CM | POA: Diagnosis not present

## 2023-11-20 DIAGNOSIS — Z743 Need for continuous supervision: Secondary | ICD-10-CM | POA: Diagnosis not present

## 2023-11-20 DIAGNOSIS — Z66 Do not resuscitate: Secondary | ICD-10-CM | POA: Diagnosis present

## 2023-11-20 DIAGNOSIS — G8929 Other chronic pain: Secondary | ICD-10-CM | POA: Diagnosis present

## 2023-11-20 DIAGNOSIS — Y92003 Bedroom of unspecified non-institutional (private) residence as the place of occurrence of the external cause: Secondary | ICD-10-CM | POA: Diagnosis not present

## 2023-11-20 DIAGNOSIS — Z803 Family history of malignant neoplasm of breast: Secondary | ICD-10-CM | POA: Diagnosis not present

## 2023-11-20 DIAGNOSIS — R5381 Other malaise: Secondary | ICD-10-CM | POA: Diagnosis present

## 2023-11-20 DIAGNOSIS — E7800 Pure hypercholesterolemia, unspecified: Secondary | ICD-10-CM | POA: Diagnosis not present

## 2023-11-20 DIAGNOSIS — M4856XD Collapsed vertebra, not elsewhere classified, lumbar region, subsequent encounter for fracture with routine healing: Secondary | ICD-10-CM | POA: Diagnosis not present

## 2023-11-20 DIAGNOSIS — I10 Essential (primary) hypertension: Secondary | ICD-10-CM | POA: Diagnosis present

## 2023-11-20 DIAGNOSIS — R918 Other nonspecific abnormal finding of lung field: Secondary | ICD-10-CM | POA: Diagnosis not present

## 2023-11-20 DIAGNOSIS — Z681 Body mass index (BMI) 19 or less, adult: Secondary | ICD-10-CM

## 2023-11-20 DIAGNOSIS — D696 Thrombocytopenia, unspecified: Secondary | ICD-10-CM | POA: Diagnosis present

## 2023-11-20 DIAGNOSIS — Z8249 Family history of ischemic heart disease and other diseases of the circulatory system: Secondary | ICD-10-CM

## 2023-11-20 DIAGNOSIS — M545 Low back pain, unspecified: Secondary | ICD-10-CM | POA: Diagnosis not present

## 2023-11-20 DIAGNOSIS — Z471 Aftercare following joint replacement surgery: Secondary | ICD-10-CM | POA: Diagnosis not present

## 2023-11-20 DIAGNOSIS — S72141D Displaced intertrochanteric fracture of right femur, subsequent encounter for closed fracture with routine healing: Secondary | ICD-10-CM | POA: Diagnosis not present

## 2023-11-20 DIAGNOSIS — F0390 Unspecified dementia without behavioral disturbance: Secondary | ICD-10-CM | POA: Diagnosis present

## 2023-11-20 DIAGNOSIS — W19XXXD Unspecified fall, subsequent encounter: Secondary | ICD-10-CM | POA: Diagnosis not present

## 2023-11-20 DIAGNOSIS — Z96642 Presence of left artificial hip joint: Secondary | ICD-10-CM | POA: Diagnosis present

## 2023-11-20 DIAGNOSIS — Z043 Encounter for examination and observation following other accident: Secondary | ICD-10-CM | POA: Diagnosis not present

## 2023-11-20 DIAGNOSIS — R609 Edema, unspecified: Secondary | ICD-10-CM | POA: Diagnosis not present

## 2023-11-20 DIAGNOSIS — R262 Difficulty in walking, not elsewhere classified: Secondary | ICD-10-CM | POA: Diagnosis not present

## 2023-11-20 LAB — COMPREHENSIVE METABOLIC PANEL WITH GFR
ALT: 48 U/L — ABNORMAL HIGH (ref 0–44)
AST: 73 U/L — ABNORMAL HIGH (ref 15–41)
Albumin: 3.1 g/dL — ABNORMAL LOW (ref 3.5–5.0)
Alkaline Phosphatase: 71 U/L (ref 38–126)
Anion gap: 12 (ref 5–15)
BUN: 10 mg/dL (ref 8–23)
CO2: 22 mmol/L (ref 22–32)
Calcium: 9.5 mg/dL (ref 8.9–10.3)
Chloride: 98 mmol/L (ref 98–111)
Creatinine, Ser: 0.58 mg/dL (ref 0.44–1.00)
GFR, Estimated: >60 mL/min (ref >60)
Glucose, Bld: 185 mg/dL — ABNORMAL HIGH (ref 70–99)
Potassium: 4.2 mmol/L (ref 3.5–5.1)
Sodium: 132 mmol/L — ABNORMAL LOW (ref 135–145)
Total Bilirubin: 2.2 mg/dL — ABNORMAL HIGH (ref 0.0–1.2)
Total Protein: 5.7 g/dL — ABNORMAL LOW (ref 6.5–8.1)

## 2023-11-20 LAB — CBC WITH DIFFERENTIAL/PLATELET
Abs Immature Granulocytes: 0.02 K/uL (ref 0.00–0.07)
Basophils Absolute: 0.1 K/uL (ref 0.0–0.1)
Basophils Relative: 1 %
Eosinophils Absolute: 0.6 K/uL — ABNORMAL HIGH (ref 0.0–0.5)
Eosinophils Relative: 9 %
HCT: 36.5 % (ref 36.0–46.0)
Hemoglobin: 13.1 g/dL (ref 12.0–15.0)
Immature Granulocytes: 0 %
Lymphocytes Relative: 21 %
Lymphs Abs: 1.4 K/uL (ref 0.7–4.0)
MCH: 35.2 pg — ABNORMAL HIGH (ref 26.0–34.0)
MCHC: 35.9 g/dL (ref 30.0–36.0)
MCV: 98.1 fL (ref 80.0–100.0)
Monocytes Absolute: 0.5 K/uL (ref 0.1–1.0)
Monocytes Relative: 7 %
Neutro Abs: 4.1 K/uL (ref 1.7–7.7)
Neutrophils Relative %: 62 %
Platelets: 149 K/uL — ABNORMAL LOW (ref 150–400)
RBC: 3.72 MIL/uL — ABNORMAL LOW (ref 3.87–5.11)
RDW: 14.5 % (ref 11.5–15.5)
WBC: 6.7 K/uL (ref 4.0–10.5)
nRBC: 0 % (ref 0.0–0.2)

## 2023-11-20 LAB — PROTIME-INR
INR: 1.5 — ABNORMAL HIGH (ref 0.8–1.2)
Prothrombin Time: 18.6 s — ABNORMAL HIGH (ref 11.4–15.2)

## 2023-11-20 MED ORDER — SODIUM CHLORIDE 0.9 % IV SOLN: INTRAVENOUS | Status: AC

## 2023-11-20 MED ORDER — ONDANSETRON HCL 4 MG/2ML IJ SOLN
4.0000 mg | Freq: Once | INTRAMUSCULAR | Status: AC
  Administered 2023-11-20: 4 mg via INTRAVENOUS
  Filled 2023-11-20: qty 2

## 2023-11-20 MED ORDER — FENTANYL CITRATE (PF) 100 MCG/2ML IJ SOLN: 50.0000 ug | INTRAMUSCULAR | Status: DC | PRN

## 2023-11-20 NOTE — ED Triage Notes (Signed)
 Pt fell while trying to undress and landed on her right hip/leg. Pt has shortening and rotation of right leg. Pulses present

## 2023-11-20 NOTE — ED Provider Notes (Signed)
 6:01 AM Assumed care from Dr. Cleotilde, please see their note for full history, physical and decision making until this point. In brief this is a 83 y.o. year old female who presented to the ED tonight with Fall     Likely R hip fx. Thrombocytopenia. Pending xr's workup.  Xr with intertrochanteric hip fx on right, d/w Dr. Margrette who will see in AM for consideration of surgery. D/w [facility] for admission pending.   Labs, studies and imaging reviewed by myself and considered in medical decision making if ordered. Imaging interpreted by radiology.  Labs Reviewed  CBC WITH DIFFERENTIAL/PLATELET - Abnormal; Notable for the following components:      Result Value   RBC 3.72 (*)    MCH 35.2 (*)    Platelets 149 (*)    Eosinophils Absolute 0.6 (*)    All other components within normal limits  PROTIME-INR - Abnormal; Notable for the following components:   Prothrombin Time 18.6 (*)    INR 1.5 (*)    All other components within normal limits  COMPREHENSIVE METABOLIC PANEL WITH GFR - Abnormal; Notable for the following components:   Sodium 132 (*)    Glucose, Bld 185 (*)    Total Protein 5.7 (*)    Albumin  3.1 (*)    AST 73 (*)    ALT 48 (*)    Total Bilirubin 2.2 (*)    All other components within normal limits  CBC - Abnormal; Notable for the following components:   RBC 3.28 (*)    Hemoglobin 11.5 (*)    HCT 32.4 (*)    MCH 35.1 (*)    Platelets 145 (*)    All other components within normal limits  BASIC METABOLIC PANEL WITH GFR - Abnormal; Notable for the following components:   Sodium 133 (*)    Glucose, Bld 103 (*)    Calcium 8.8 (*)    All other components within normal limits  SURGICAL PCR SCREEN  TYPE AND SCREEN    DG Hip Unilat  With Pelvis 2-3 Views Right  Final Result    DG Chest 1 View  Final Result      No follow-ups on file.    Gustaf Mccarter, Selinda, MD 11/21/23 [phone]

## 2023-11-20 NOTE — ED Provider Notes (Signed)
 [place] EMERGENCY DEPARTMENT AT [facility] Provider Note   CSN: [id number] Arrival date & time: 11/20/23  2237     Patient presents with: Fall   Lauren Lawrence is a 83 y.o. female.    Fall   This patient is a very pleasant 83 year old female, lives with her daughter, evidently this evening as she was trying to get ready for bed and get undressed she lost her balance and landed on her right hip causing acute onset of pain.  Unfortunately the patient does have a history of a prior hip fracture on the left side, she was treated with a bipolar hemiarthroplasty of the left hip in June 2024 by Dr. Margrette.  She is also treated for chronic thrombocytopenia, her recent platelet count was 89,000 and this is fairly consistent for what her baseline is.  She denies head injury, chest pain, shortness of breath, arm pain, the daughter brings her in by private vehicle    Prior to Admission medications   Medication Sig Start Date End Date Taking? Authorizing Provider  Coenzyme Q10 10 MG capsule Take 10 mg by mouth daily.    [provider]  Ferrous Sulfate  (IRON ) 325 (65 Fe) MG TABS Take 1 tablet (325 mg total) by mouth daily. 10/19/22   Pearlean Manus, MD  levothyroxine  (SYNTHROID ) 100 MCG tablet Take 1 tablet (100 mcg total) by mouth daily. 09/05/23   Simmons-Robinson, Rockie, MD  losartan  (COZAAR ) 100 MG tablet Take 1 tablet (100 mg total) by mouth daily. 07/12/23   [name], MD  metoprolol  succinate (TOPROL -XL) 100 MG 24 hr tablet Take 1 tablet (100 mg total) by mouth daily. Take with or immediately following a meal. 06/01/23   [name], MD  Multiple Vitamin (MULTIVITAMIN WITH MINERALS) TABS tablet Take 1 tablet by mouth daily.    [provider]  risedronate  (ACTONEL ) 35 MG tablet Take 1 tablet (35 mg total) by mouth every 7 (seven) days. with water on empty stomach, nothing by mouth or lie down for next 30 minutes. 11/19/22   [name], MD     Allergies: Patient has no known allergies.    Review of Systems  All other systems reviewed and are negative.   Updated Vital Signs BP (!) 167/82   Pulse 66   Temp 98.3 F (36.8 C) (Oral)   Resp 18   Ht 1.626 m (5' 4)   Wt 41 kg   SpO2 99%   BMI 15.52 kg/m   Physical Exam Vitals and nursing note reviewed.  Constitutional:      General: She is not in acute distress.    Appearance: She is well-developed.  HENT:     Head: Normocephalic and atraumatic.     Mouth/Throat:     Pharynx: No oropharyngeal exudate.  Eyes:     General: No scleral icterus.       Right eye: No discharge.        Left eye: No discharge.     Conjunctiva/sclera: Conjunctivae normal.     Pupils: Pupils are equal, round, and reactive to light.  Neck:     Thyroid : No thyromegaly.     Vascular: No JVD.  Cardiovascular:     Rate and Rhythm: Normal rate and regular rhythm.     Heart sounds: Normal heart sounds. No murmur heard.    No friction rub. No gallop.  Pulmonary:     Effort: Pulmonary effort is normal. No respiratory distress.  Breath sounds: Normal breath sounds. No wheezing or rales.  Abdominal:     General: Bowel sounds are normal. There is no distension.     Palpations: Abdomen is soft. There is no mass.     Tenderness: There is no abdominal tenderness.  Musculoskeletal:        General: Tenderness, deformity and signs of injury present.     Cervical back: Normal range of motion and neck supple.     Right lower leg: No edema.     Left lower leg: No edema.     Comments: All 4 extremities examined, the patient has normal upper extremities, the left lower extremity is unremarkable, the right lower extremity is shortened and externally rotated with tenderness with range of motion of the right hip  Lymphadenopathy:     Cervical: No cervical adenopathy.  Skin:    General: Skin is warm and dry.     Findings: No erythema or rash.  Neurological:     General: No focal deficit present.      Mental Status: She is alert.     Coordination: Coordination normal.  Psychiatric:        Behavior: Behavior normal.     (all labs ordered are listed, but only abnormal results are displayed) Labs Reviewed - No data to display  EKG: None  Radiology: No results found.   Procedures   Medications Ordered in the ED  0.9 %  sodium chloride  infusion (has no administration in time range)  fentaNYL  (SUBLIMAZE ) injection 50 mcg (has no administration in time range)  ondansetron  (ZOFRAN ) injection 4 mg (has no administration in time range)                                    Medical Decision Making Amount and/or Complexity of Data Reviewed Labs: ordered. Radiology: ordered.  Risk Prescription drug management.   Patient's vital signs reviewed, she does have some hypertension   This patient presents to the ED for concern of hip injury on the right after a fall, this involves an extensive number of treatment options, and is a complaint that carries with it a high risk of complications and morbidity.  The differential diagnosis includes fracture, contusion, dislocation   Co morbidities / Chronic conditions that complicate the patient evaluation  Chronic thrombocytopenia, hypertension   Additional history obtained:  Additional history obtained from EMR External records from outside source obtained and reviewed including prior surgical notes by Dr. Margrette   At the time of change of shift care signed out to oncoming physician Dr. Lorette to follow-up results of the x-ray and disposition accordingly, anticipate the need for admission for hip fracture.  Labs EKG and x-rays pending at the time of change of shift      Final diagnoses:  None    ED Discharge Orders     None          Cleotilde Rogue, MD 11/20/23 2320

## 2023-11-21 DIAGNOSIS — Z471 Aftercare following joint replacement surgery: Secondary | ICD-10-CM | POA: Diagnosis not present

## 2023-11-21 DIAGNOSIS — F0390 Unspecified dementia without behavioral disturbance: Secondary | ICD-10-CM | POA: Diagnosis present

## 2023-11-21 DIAGNOSIS — W010XXA Fall on same level from slipping, tripping and stumbling without subsequent striking against object, initial encounter: Secondary | ICD-10-CM | POA: Diagnosis present

## 2023-11-21 DIAGNOSIS — Z9181 History of falling: Secondary | ICD-10-CM | POA: Diagnosis not present

## 2023-11-21 DIAGNOSIS — E43 Unspecified severe protein-calorie malnutrition: Secondary | ICD-10-CM | POA: Diagnosis present

## 2023-11-21 DIAGNOSIS — E039 Hypothyroidism, unspecified: Secondary | ICD-10-CM

## 2023-11-21 DIAGNOSIS — R5381 Other malaise: Secondary | ICD-10-CM | POA: Diagnosis present

## 2023-11-21 DIAGNOSIS — G8929 Other chronic pain: Secondary | ICD-10-CM | POA: Insufficient documentation

## 2023-11-21 DIAGNOSIS — F32A Depression, unspecified: Secondary | ICD-10-CM | POA: Diagnosis not present

## 2023-11-21 DIAGNOSIS — I10 Essential (primary) hypertension: Secondary | ICD-10-CM | POA: Insufficient documentation

## 2023-11-21 DIAGNOSIS — R339 Retention of urine, unspecified: Secondary | ICD-10-CM | POA: Diagnosis not present

## 2023-11-21 DIAGNOSIS — E860 Dehydration: Secondary | ICD-10-CM | POA: Diagnosis present

## 2023-11-21 DIAGNOSIS — D696 Thrombocytopenia, unspecified: Secondary | ICD-10-CM | POA: Diagnosis present

## 2023-11-21 DIAGNOSIS — Z79890 Hormone replacement therapy (postmenopausal): Secondary | ICD-10-CM | POA: Diagnosis not present

## 2023-11-21 DIAGNOSIS — Z96642 Presence of left artificial hip joint: Secondary | ICD-10-CM | POA: Diagnosis present

## 2023-11-21 DIAGNOSIS — E8809 Other disorders of plasma-protein metabolism, not elsewhere classified: Secondary | ICD-10-CM | POA: Diagnosis present

## 2023-11-21 DIAGNOSIS — S72141A Displaced intertrochanteric fracture of right femur, initial encounter for closed fracture: Secondary | ICD-10-CM | POA: Diagnosis present

## 2023-11-21 DIAGNOSIS — E785 Hyperlipidemia, unspecified: Secondary | ICD-10-CM | POA: Diagnosis present

## 2023-11-21 DIAGNOSIS — E871 Hypo-osmolality and hyponatremia: Secondary | ICD-10-CM

## 2023-11-21 DIAGNOSIS — R7989 Other specified abnormal findings of blood chemistry: Secondary | ICD-10-CM | POA: Diagnosis present

## 2023-11-21 DIAGNOSIS — Y92003 Bedroom of unspecified non-institutional (private) residence as the place of occurrence of the external cause: Secondary | ICD-10-CM | POA: Diagnosis not present

## 2023-11-21 DIAGNOSIS — Z803 Family history of malignant neoplasm of breast: Secondary | ICD-10-CM | POA: Diagnosis not present

## 2023-11-21 DIAGNOSIS — Z681 Body mass index (BMI) 19 or less, adult: Secondary | ICD-10-CM | POA: Diagnosis not present

## 2023-11-21 DIAGNOSIS — M545 Low back pain, unspecified: Secondary | ICD-10-CM | POA: Diagnosis not present

## 2023-11-21 DIAGNOSIS — Z8781 Personal history of (healed) traumatic fracture: Secondary | ICD-10-CM | POA: Diagnosis not present

## 2023-11-21 DIAGNOSIS — S72001A Fracture of unspecified part of neck of right femur, initial encounter for closed fracture: Secondary | ICD-10-CM | POA: Diagnosis not present

## 2023-11-21 DIAGNOSIS — R609 Edema, unspecified: Secondary | ICD-10-CM | POA: Diagnosis not present

## 2023-11-21 DIAGNOSIS — Z79899 Other long term (current) drug therapy: Secondary | ICD-10-CM | POA: Diagnosis not present

## 2023-11-21 DIAGNOSIS — Z8249 Family history of ischemic heart disease and other diseases of the circulatory system: Secondary | ICD-10-CM | POA: Diagnosis not present

## 2023-11-21 DIAGNOSIS — R296 Repeated falls: Secondary | ICD-10-CM | POA: Diagnosis present

## 2023-11-21 DIAGNOSIS — Z66 Do not resuscitate: Secondary | ICD-10-CM | POA: Diagnosis present

## 2023-11-21 LAB — BASIC METABOLIC PANEL WITH GFR
Anion gap: 8 (ref 5–15)
BUN: 9 mg/dL (ref 8–23)
CO2: 24 mmol/L (ref 22–32)
Calcium: 8.8 mg/dL — ABNORMAL LOW (ref 8.9–10.3)
Chloride: 101 mmol/L (ref 98–111)
Creatinine, Ser: 0.49 mg/dL (ref 0.44–1.00)
GFR, Estimated: >60 mL/min (ref >60)
Glucose, Bld: 103 mg/dL — ABNORMAL HIGH (ref 70–99)
Potassium: 4.2 mmol/L (ref 3.5–5.1)
Sodium: 133 mmol/L — ABNORMAL LOW (ref 135–145)

## 2023-11-21 LAB — CBC
HCT: 32.4 % — ABNORMAL LOW (ref 36.0–46.0)
Hemoglobin: 11.5 g/dL — ABNORMAL LOW (ref 12.0–15.0)
MCH: 35.1 pg — ABNORMAL HIGH (ref 26.0–34.0)
MCHC: 35.5 g/dL (ref 30.0–36.0)
MCV: 98.8 fL (ref 80.0–100.0)
Platelets: 145 K/uL — ABNORMAL LOW (ref 150–400)
RBC: 3.28 MIL/uL — ABNORMAL LOW (ref 3.87–5.11)
RDW: 14.3 % (ref 11.5–15.5)
WBC: 8.8 K/uL (ref 4.0–10.5)
nRBC: 0 % (ref 0.0–0.2)

## 2023-11-21 LAB — TYPE AND SCREEN
ABO/RH(D): A POS
Antibody Screen: NEGATIVE

## 2023-11-21 LAB — SURGICAL PCR SCREEN
MRSA, PCR: NEGATIVE
Staphylococcus aureus: NEGATIVE

## 2023-11-21 MED ORDER — MAGNESIUM HYDROXIDE 400 MG/5ML PO SUSP
30.0000 mL | Freq: Every day | ORAL | Status: DC | PRN
Start: 2023-11-21 — End: 2023-11-28
  Administered 2023-11-24 – 2023-11-27 (×2): 30 mL via ORAL
  Filled 2023-11-21 (×2): qty 30

## 2023-11-21 MED ORDER — ADULT MULTIVITAMIN W/MINERALS CH
1.0000 | ORAL_TABLET | Freq: Every day | ORAL | Status: DC
  Administered 2023-11-21 – 2023-11-28 (×8): 1 via ORAL
  Filled 2023-11-21 (×8): qty 1

## 2023-11-21 MED ORDER — SODIUM CHLORIDE 0.9 % IV SOLN: INTRAVENOUS | Status: DC

## 2023-11-21 MED ORDER — ACETAMINOPHEN 325 MG PO TABS: 650.0000 mg | ORAL_TABLET | ORAL | Status: DC | PRN

## 2023-11-21 MED ORDER — TRAZODONE HCL 50 MG PO TABS
25.0000 mg | ORAL_TABLET | Freq: Every evening | ORAL | Status: DC | PRN
  Administered 2023-11-22: 25 mg via ORAL
  Filled 2023-11-21 (×2): qty 1

## 2023-11-21 MED ORDER — METOPROLOL SUCCINATE ER 50 MG PO TB24
100.0000 mg | ORAL_TABLET | Freq: Every day | ORAL | Status: DC
  Administered 2023-11-21 – 2023-11-22 (×2): 100 mg via ORAL
  Filled 2023-11-21 (×3): qty 2

## 2023-11-21 MED ORDER — FERROUS SULFATE 325 (65 FE) MG PO TABS
325.0000 mg | ORAL_TABLET | Freq: Every day | ORAL | Status: DC
  Administered 2023-11-21 – 2023-11-28 (×8): 325 mg via ORAL
  Filled 2023-11-21 (×8): qty 1

## 2023-11-21 MED ORDER — MORPHINE SULFATE (PF) 2 MG/ML IV SOLN
2.0000 mg | INTRAVENOUS | Status: DC | PRN
  Administered 2023-11-21 (×2): 2 mg via INTRAVENOUS
  Filled 2023-11-21 (×2): qty 1

## 2023-11-21 MED ORDER — HYDROCODONE-ACETAMINOPHEN 5-325 MG PO TABS
1.0000 | ORAL_TABLET | Freq: Four times a day (QID) | ORAL | Status: DC | PRN
  Administered 2023-11-21: 2 via ORAL
  Filled 2023-11-21: qty 2

## 2023-11-21 MED ORDER — ONDANSETRON HCL 4 MG/2ML IJ SOLN: 4.0000 mg | INTRAMUSCULAR | Status: DC | PRN

## 2023-11-21 MED ORDER — LEVOTHYROXINE SODIUM 100 MCG PO TABS
100.0000 ug | ORAL_TABLET | Freq: Every day | ORAL | Status: DC
  Administered 2023-11-21 – 2023-11-28 (×8): 100 ug via ORAL
  Filled 2023-11-21 (×8): qty 1

## 2023-11-21 MED ORDER — LOSARTAN POTASSIUM 50 MG PO TABS
100.0000 mg | ORAL_TABLET | Freq: Every day | ORAL | Status: DC
  Administered 2023-11-21 – 2023-11-22 (×2): 100 mg via ORAL
  Filled 2023-11-21 (×3): qty 2

## 2023-11-21 NOTE — Progress Notes (Signed)
 Patient seen and examined; admitted after midnight secondary to mechanical fall and right hip pain with inability to bear weight.  Workup demonstrating right intertrochanteric fracture.  Patient hemodynamically stable.  Orthopedic surgery has been consulted and will follow recommendations for needed repair.  Please refer to H&P written by Dr. Lawence on 11/21/2023 for further info/details on admission.  Eric Nunnery MD [phone]

## 2023-11-21 NOTE — H&P (View-Only) (Signed)
 Consult requested  Diagnosis right hip fracture  Consulting physician  Chief complaint right hip pain  History 83 year old female status post left hip fracture treated with bipolar replacement presents now with right hip fracture after fall on August 3  Her daughter tells me that she has been having frequent falls and lost quite a bit of weight after taking an antibiotic.  She has regained about 4 pounds after about a 35 pound weight loss She was evaluated in the past for liver function test abnormalities and weight loss but no formal diagnosis was determined.  She notes that she has less pain than she had with her left hip but she is still unable to walk has increased pain when she moves the leg and the pain is located over the right hip and greater trochanter and proximal thigh  Review of systems patient has dementia again frequent falls history of chronic lower back pain  Other review of systems currently noncontributory  Past Medical History:  Diagnosis Date   Depression    Hyperlipidemia    Hypertension    Memory loss    Past Surgical History:  Procedure Laterality Date   CATARACT EXTRACTION  03/2020   HIP ARTHROPLASTY Left 10/14/2022   Procedure: ARTHROPLASTY BIPOLAR HIP (HEMIARTHROPLASTY);  Surgeon: Margrette Taft BRAVO, MD;  Location: [facility];  Service: Orthopedics;  Laterality: Left;   Family History  Problem Relation Age of Onset   Heart disease Mother    Breast cancer Mother 75   Dementia Father    Hypertension Father    Heart disease Father    Gout Father    Heart disease Brother        MI with CABG X3   Breast cancer Maternal Aunt 27   Social History   Tobacco Use   Smoking status: Never   Smokeless tobacco: Never  Vaping Use   Vaping status: Never Used  Substance Use Topics   Alcohol use: No   Drug use: No   Current Outpatient Medications  Medication Instructions   Coenzyme Q10 10 mg, Daily   Iron  325 mg, Oral, Daily   levothyroxine  (SYNTHROID )  100 mcg, Oral, Daily   losartan  (COZAAR ) 100 mg, Oral, Daily   metoprolol  succinate (TOPROL -XL) 100 mg, Oral, Daily, Take with or immediately following a meal.   Multiple Vitamin (MULTIVITAMIN WITH MINERALS) TABS tablet 1 tablet, Daily   risedronate  (ACTONEL ) 35 mg, Oral, Every 7 days, with water on empty stomach, nothing by mouth or lie down for next 30 minutes.    No Known Allergies  BP (!) 150/50 (BP Location: Left Arm)   Pulse (!) 49   Temp 98 F (36.7 C) (Oral)   Resp 16   Ht 5' 4 (1.626 m)   Wt 43.2 kg   SpO2 99%   BMI 16.35 kg/m   Appearance patient is very thin and ectomorphic in terms of her body habitus  She is disoriented to place and time oriented to person  Mood pleasant affect flat  Gait unable to ambulate  Right and left upper extremity normal alignment no contractures no dislocations no atrophy or tremor skin intact  Left lower extremity normal alignment no contracture no instability or subluxation muscle tone normal skin normal  Right hip tender proximal thigh and greater trochanter area.  Range of motion stability test deferred no atrophy skin normal.  Pulses are normal  The groin area no palpable lymph nodes  Sensation normal.  Pathologic reflexes deferred deep tendon reflexes normal in  the upper extremity lower extremity deferred  Coordination balance testing deferred     Latest Ref Rng & Units 11/21/2023    4:23 AM 11/20/2023   11:27 PM 09/02/2023    4:08 PM  CBC  WBC 4.0 - 10.5 K/uL 8.8  6.7  4.8   Hemoglobin 12.0 - 15.0 g/dL 88.4  86.8  85.2   Hematocrit 36.0 - 46.0 % 32.4  36.5  42.3   Platelets 150 - 400 K/uL 145  149  89       Latest Ref Rng & Units 11/21/2023    4:23 AM 11/20/2023   11:27 PM 09/02/2023    4:08 PM  BMP  Glucose 70 - 99 mg/dL 896  814  880   BUN 8 - 23 mg/dL 9  10  10    Creatinine 0.44 - 1.00 mg/dL 9.50  9.41  9.38   BUN/Creat Ratio 12 - 28   16   Sodium 135 - 145 mmol/L 133  132  141   Potassium 3.5 - 5.1 mmol/L 4.2   4.2  3.6   Chloride 98 - 111 mmol/L 101  98  103   CO2 22 - 32 mmol/L 24  22  26    Calcium 8.9 - 10.3 mg/dL 8.8  9.5  9.9    DG Chest 1 View Result Date: 11/20/2023 CLINICAL DATA:  Status post fall. EXAM: CHEST  1 VIEW COMPARISON:  Sep 06, 2023 FINDINGS: The heart size and mediastinal contours are within normal limits. Mild, stable, diffuse, chronic appearing increased interstitial lung markings are seen. No acute infiltrate, pleural effusion or pneumothorax is identified. A compression fracture deformity of indeterminate age is seen at the level of L1. IMPRESSION: 1. Chronic appearing increased interstitial lung markings without acute or active cardiopulmonary disease. 2. L1 vertebral body compression fracture of indeterminate age. Correlation with physical examination is recommended to determine the presence of point tenderness. Electronically Signed   By: Suzen Dials M.D.   On: 11/20/2023 23:24   DG Hip Unilat  With Pelvis 2-3 Views Right  My interpretation of the x-ray that was read below by radiology is that the patient has a right intertrochanteric fracture appears to be a 2 part fracture and varus angulation there is also a left bipolar hip replacement noted and it is in good position   Result Date: 11/20/2023 CLINICAL DATA:  Status post fall. EXAM: DG HIP (WITH OR WITHOUT PELVIS) 2-3V RIGHT COMPARISON:  None Available. FINDINGS: An acute fracture deformity is seen extending through the inter trochanteric region of the proximal right femur. There is mild dorsal angulation of the fracture apex without evidence of dislocation. An intact left hip replacement is noted. IMPRESSION: Acute intertrochanteric fracture of the proximal right femur. Electronically Signed   By: Suzen Dials M.D.   On: 11/20/2023 23:22    Assessment and plan  Right hip fracture requires operative repair  Risks and benefits of surgery explained to the patient and her daughter as the patient has  dementia  Recommend internal fixation right hip with nail device

## 2023-11-21 NOTE — Assessment & Plan Note (Signed)
-   Will continue Synthroid.

## 2023-11-21 NOTE — Assessment & Plan Note (Signed)
-   They are slightly higher than previous levels. - Will follow LFTs in AM.

## 2023-11-21 NOTE — Assessment & Plan Note (Signed)
-   This is likely secondary to her old L1 vertebral compression fracture. - Pain management will be provided.

## 2023-11-21 NOTE — H&P (Addendum)
 [place]   PATIENT NAME: Lauren Lawrence    MR#:  [id number]  DATE OF BIRTH:  03-09-1941  DATE OF ADMISSION:  11/20/2023  PRIMARY CARE PHYSICIAN: [name], MD   Patient is coming from: Home  REQUESTING/REFERRING PHYSICIAN: Mesner, Selinda, MD  CHIEF COMPLAINT:   Chief Complaint  Patient presents with   Fall    HISTORY OF PRESENT ILLNESS:  Marinell C Riebel is a 83 y.o. Caucasian female with medical history significant for hypertension, dyslipidemia and depression, who presented to the emergency room with acute onset of fall.  The patient was getting undressed to get ready for bed when she lost her balance and landed on her right hip with subsequent acute pain.  She denies any head injury.  No paresthesias or focal muscle weakness.  No presyncope or syncope.  No chest pain or palpitations.  No fever or chills.  No cough or wheezing or dyspnea.  No bleeding diathesis.  She is status post left hip hemiarthroplasty for previous history of left hip fracture.  The patient had another fall in May per her daughter and has been having chronic back pain since then.  ED Course: Upon presentation to the emergency room, BP was 167/82 with otherwise normal vital signs.  Labs revealed a sodium of 132 and blood glucose of 185 with albumin  of 3.1.  AST was 73 and ALT 48 with total protein 5.7 and total bili 2.2. EKG as reviewed by me : EKG showed normal sinus rhythm with a rate of 65 with poor R wave progression and Q waves inferiorly Imaging: 1. Chronic appearing increased interstitial lung markings without acute or active cardiopulmonary disease. 2. L1 vertebral body compression fracture of indeterminate age. Correlation with physical examination is recommended to determine the presence of point tenderness.  The patient was given 50 mcg of IV fentanyl  and 4 mg of IV Zofran .  Dr. Margrette was notified about the patient and is aware.  She will be admitted to a telemetry bed for further  evaluation and management.   PAST MEDICAL HISTORY:   Past Medical History:  Diagnosis Date   Depression    Hyperlipidemia    Hypertension    Memory loss     PAST SURGICAL HISTORY:   Past Surgical History:  Procedure Laterality Date   CATARACT EXTRACTION  03/2020   HIP ARTHROPLASTY Left 10/14/2022   Procedure: ARTHROPLASTY BIPOLAR HIP (HEMIARTHROPLASTY);  Surgeon: Margrette Taft BRAVO, MD;  Location: [facility];  Service: Orthopedics;  Laterality: Left;    SOCIAL HISTORY:   Social History   Tobacco Use   Smoking status: Never   Smokeless tobacco: Never  Substance Use Topics   Alcohol use: No    FAMILY HISTORY:   Family History  Problem Relation Age of Onset   Heart disease Mother    Breast cancer Mother 54   Dementia Father    Hypertension Father    Heart disease Father    Gout Father    Heart disease Brother        MI with CABG X3   Breast cancer Maternal Aunt 49    DRUG ALLERGIES:  No Known Allergies  REVIEW OF SYSTEMS:   ROS As per history of present illness. All pertinent systems were reviewed above. Constitutional, HEENT, cardiovascular, respiratory, GI, GU, musculoskeletal, neuro, psychiatric, endocrine, integumentary and hematologic systems were reviewed and are otherwise negative/unremarkable except for positive findings mentioned above in the HPI.   MEDICATIONS AT HOME:  Prior to Admission medications   Medication Sig Start Date End Date Taking? Authorizing Provider  Coenzyme Q10 10 MG capsule Take 10 mg by mouth daily.    [provider]  Ferrous Sulfate  (IRON ) 325 (65 Fe) MG TABS Take 1 tablet (325 mg total) by mouth daily. 10/19/22   Pearlean Manus, MD  levothyroxine  (SYNTHROID ) 100 MCG tablet Take 1 tablet (100 mcg total) by mouth daily. 09/05/23   Simmons-Robinson, Rockie, MD  losartan  (COZAAR ) 100 MG tablet Take 1 tablet (100 mg total) by mouth daily. 07/12/23   [name], MD  metoprolol  succinate (TOPROL -XL) 100 MG 24 hr tablet  Take 1 tablet (100 mg total) by mouth daily. Take with or immediately following a meal. 06/01/23   [name], MD  Multiple Vitamin (MULTIVITAMIN WITH MINERALS) TABS tablet Take 1 tablet by mouth daily.    [provider]  risedronate  (ACTONEL ) 35 MG tablet Take 1 tablet (35 mg total) by mouth every 7 (seven) days. with water on empty stomach, nothing by mouth or lie down for next 30 minutes. 11/19/22   [name], MD      VITAL SIGNS:  Blood pressure (!) 141/57, pulse 61, temperature 98.1 F (36.7 C), temperature source Oral, resp. rate 14, height 5' 4 (1.626 m), weight 43.2 kg, SpO2 99%.  PHYSICAL EXAMINATION:  Physical Exam  GENERAL:  83 y.o.-year-old Caucasian female patient lying in the bed with no acute distress.  EYES: Pupils equal, round, reactive to light and accommodation. No scleral icterus. Extraocular muscles intact.  HEENT: Head atraumatic, normocephalic. Oropharynx and nasopharynx clear.  NECK:  Supple, no jugular venous distention. No thyroid  enlargement, no tenderness.  LUNGS: Normal breath sounds bilaterally, no wheezing, rales,rhonchi or crepitation. No use of accessory muscles of respiration.  CARDIOVASCULAR: Regular rate and rhythm, S1, S2 normal. No murmurs, rubs, or gallops.  ABDOMEN: Soft, nondistended, nontender. Bowel sounds present. No organomegaly or mass.  EXTREMITIES: No pedal edema, cyanosis, or clubbing.  NEUROLOGIC: Cranial nerves II through XII are intact. Muscle strength 5/5 in all extremities. Sensation intact. Gait not checked. Musculoskeletal: Right lateral hip tenderness.  No mid  back tenderness at L1 level. PSYCHIATRIC: The patient is alert and oriented x 3.  Normal affect and good eye contact. SKIN: No obvious rash, lesion, or ulcer.   LABORATORY PANEL:   CBC Recent Labs  Lab 11/21/23 0423  WBC 8.8  HGB 11.5*  HCT 32.4*  PLT 145*    ------------------------------------------------------------------------------------------------------------------  Chemistries  Recent Labs  Lab 11/20/23 2327 11/21/23 0423  NA 132* 133*  K 4.2 4.2  CL 98 101  CO2 22 24  GLUCOSE 185* 103*  BUN 10 9  CREATININE 0.58 0.49  CALCIUM 9.5 8.8*  AST 73*  --   ALT 48*  --   ALKPHOS 71  --   BILITOT 2.2*  --    ------------------------------------------------------------------------------------------------------------------  Cardiac Enzymes No results for input(s): TROPONINI in the last 168 hours. ------------------------------------------------------------------------------------------------------------------  RADIOLOGY:  DG Chest 1 View Result Date: 11/20/2023 CLINICAL DATA:  Status post fall. EXAM: CHEST  1 VIEW COMPARISON:  Sep 06, 2023 FINDINGS: The heart size and mediastinal contours are within normal limits. Mild, stable, diffuse, chronic appearing increased interstitial lung markings are seen. No acute infiltrate, pleural effusion or pneumothorax is identified. A compression fracture deformity of indeterminate age is seen at the level of L1. IMPRESSION: 1. Chronic appearing increased interstitial lung markings without acute or active cardiopulmonary disease. 2. L1 vertebral body compression  fracture of indeterminate age. Correlation with physical examination is recommended to determine the presence of point tenderness. Electronically Signed   By: Suzen Dials M.D.   On: 11/20/2023 23:24   DG Hip Unilat  With Pelvis 2-3 Views Right Result Date: 11/20/2023 CLINICAL DATA:  Status post fall. EXAM: DG HIP (WITH OR WITHOUT PELVIS) 2-3V RIGHT COMPARISON:  None Available. FINDINGS: An acute fracture deformity is seen extending through the inter trochanteric region of the proximal right femur. There is mild dorsal angulation of the fracture apex without evidence of dislocation. An intact left hip replacement is noted. IMPRESSION: Acute  intertrochanteric fracture of the proximal right femur. Electronically Signed   By: Suzen Dials M.D.   On: 11/20/2023 23:22      IMPRESSION AND PLAN:  Assessment and Plan: * Closed right hip fracture, initial encounter [facility]) - The patient will be admitted to a telemetry bed. - Pain management will be provided. - She will be kept n.p.o. after midnight. - Orthopedic consult will be obtained. - Dr. Margrette was notified about the patient. - The patient has no history of CHF, CVA, renal failure with creatinine more than 2 or diabetes mellitus on insulin or coronary artery disease.  She is considered at average risk for her age for perioperative cardiovascular events per the revised cardiac index.  She has no current pulmonary issues.  Hyponatremia - This is likely hypovolemic and is mild. - The patient will be hydrated with IV normal saline. - Will follow sodium level.  Hypothyroidism - Will continue Synthroid .  Elevated LFTs - They are slightly higher than previous levels. - Will follow LFTs in AM.  Chronic back pain - This is likely secondary to her old L1 vertebral compression fracture. - Pain management will be provided.  Essential hypertension - Will continue antihypertensive therapy. - Toprol -XL should offer perioperative cardiovascular risk reduction.    DVT prophylaxis: SCDs.  Medical prophylaxis is currently postponed till postoperative. Advanced Care Planning:  Code Status: The patient is DNR only.  This was discussed with her and her daughter. Family Communication:  The plan of care was discussed in details with the patient (and family). I answered all questions. The patient agreed to proceed with the above mentioned plan. Further management will depend upon hospital course. Disposition Plan: Back to previous home environment Consults called: Ortho  consult All the records are reviewed and case discussed with ED provider.  Status is: Inpatient  At the time  of the admission, it appears that the appropriate admission status for this patient is inpatient.  This is judged to be reasonable and necessary in order to provide the required intensity of service to ensure the patient's safety given the presenting symptoms, physical exam findings and initial radiographic and laboratory data in the context of comorbid conditions.  The patient requires inpatient status due to high intensity of service, high risk of further deterioration and high frequency of surveillance required.  I certify that at the time of admission, it is my clinical judgment that the patient will require inpatient hospital care extending more than 2 midnights.                            Dispo: The patient is from: Home              Anticipated d/c is to: Home              Patient currently is not medically  stable to d/c.              Difficult to place patient: No  [place] DELENA Peaches M.D on 11/21/2023 at 6:48 AM  [facility]   From 7 PM-7 AM, contact night-coverage [email or web address]  CC: Primary care physician; [name], MD

## 2023-11-21 NOTE — Assessment & Plan Note (Addendum)
-   The patient will be admitted to a telemetry bed. - Pain management will be provided. - She will be kept n.p.o. after midnight. - Orthopedic consult will be obtained. - Dr. Margrette was notified about the patient. - The patient has no history of CHF, CVA, renal failure with creatinine more than 2 or diabetes mellitus on insulin or coronary artery disease.  She is considered at average risk for her age for perioperative cardiovascular events per the revised cardiac index.  She has no current pulmonary issues.

## 2023-11-21 NOTE — Assessment & Plan Note (Signed)
-   This is likely hypovolemic and is mild. - The patient will be hydrated with IV normal saline. - Will follow sodium level.

## 2023-11-21 NOTE — Progress Notes (Signed)
   11/21/23 1014  TOC Brief Assessment  Insurance and Status Reviewed  Patient has primary care physician Yes  Home environment has been reviewed Single family home  Prior level of function: Independent  Prior/Current Home Services No current home services  Social Drivers of Health Review SDOH reviewed no interventions necessary  Readmission risk has been reviewed Yes  Transition of care needs transition of care needs identified, TOC will continue to follow   TOC following for recommendations after surgery.

## 2023-11-21 NOTE — Consult Note (Signed)
 Consult requested  Diagnosis right hip fracture  Consulting physician  Chief complaint right hip pain  History 83 year old female status post left hip fracture treated with bipolar replacement presents now with right hip fracture after fall on August 3  Her daughter tells me that she has been having frequent falls and lost quite a bit of weight after taking an antibiotic.  She has regained about 4 pounds after about a 35 pound weight loss She was evaluated in the past for liver function test abnormalities and weight loss but no formal diagnosis was determined.  She notes that she has less pain than she had with her left hip but she is still unable to walk has increased pain when she moves the leg and the pain is located over the right hip and greater trochanter and proximal thigh  Review of systems patient has dementia again frequent falls history of chronic lower back pain  Other review of systems currently noncontributory  Past Medical History:  Diagnosis Date   Depression    Hyperlipidemia    Hypertension    Memory loss    Past Surgical History:  Procedure Laterality Date   CATARACT EXTRACTION  03/2020   HIP ARTHROPLASTY Left 10/14/2022   Procedure: ARTHROPLASTY BIPOLAR HIP (HEMIARTHROPLASTY);  Surgeon: Margrette Taft BRAVO, MD;  Location: [facility];  Service: Orthopedics;  Laterality: Left;   Family History  Problem Relation Age of Onset   Heart disease Mother    Breast cancer Mother 75   Dementia Father    Hypertension Father    Heart disease Father    Gout Father    Heart disease Brother        MI with CABG X3   Breast cancer Maternal Aunt 27   Social History   Tobacco Use   Smoking status: Never   Smokeless tobacco: Never  Vaping Use   Vaping status: Never Used  Substance Use Topics   Alcohol use: No   Drug use: No   Current Outpatient Medications  Medication Instructions   Coenzyme Q10 10 mg, Daily   Iron  325 mg, Oral, Daily   levothyroxine  (SYNTHROID )  100 mcg, Oral, Daily   losartan  (COZAAR ) 100 mg, Oral, Daily   metoprolol  succinate (TOPROL -XL) 100 mg, Oral, Daily, Take with or immediately following a meal.   Multiple Vitamin (MULTIVITAMIN WITH MINERALS) TABS tablet 1 tablet, Daily   risedronate  (ACTONEL ) 35 mg, Oral, Every 7 days, with water on empty stomach, nothing by mouth or lie down for next 30 minutes.    No Known Allergies  BP (!) 150/50 (BP Location: Left Arm)   Pulse (!) 49   Temp 98 F (36.7 C) (Oral)   Resp 16   Ht 5' 4 (1.626 m)   Wt 43.2 kg   SpO2 99%   BMI 16.35 kg/m   Appearance patient is very thin and ectomorphic in terms of her body habitus  She is disoriented to place and time oriented to person  Mood pleasant affect flat  Gait unable to ambulate  Right and left upper extremity normal alignment no contractures no dislocations no atrophy or tremor skin intact  Left lower extremity normal alignment no contracture no instability or subluxation muscle tone normal skin normal  Right hip tender proximal thigh and greater trochanter area.  Range of motion stability test deferred no atrophy skin normal.  Pulses are normal  The groin area no palpable lymph nodes  Sensation normal.  Pathologic reflexes deferred deep tendon reflexes normal in  the upper extremity lower extremity deferred  Coordination balance testing deferred     Latest Ref Rng & Units 11/21/2023    4:23 AM 11/20/2023   11:27 PM 09/02/2023    4:08 PM  CBC  WBC 4.0 - 10.5 K/uL 8.8  6.7  4.8   Hemoglobin 12.0 - 15.0 g/dL 88.4  86.8  85.2   Hematocrit 36.0 - 46.0 % 32.4  36.5  42.3   Platelets 150 - 400 K/uL 145  149  89       Latest Ref Rng & Units 11/21/2023    4:23 AM 11/20/2023   11:27 PM 09/02/2023    4:08 PM  BMP  Glucose 70 - 99 mg/dL 896  814  880   BUN 8 - 23 mg/dL 9  10  10    Creatinine 0.44 - 1.00 mg/dL 9.50  9.41  9.38   BUN/Creat Ratio 12 - 28   16   Sodium 135 - 145 mmol/L 133  132  141   Potassium 3.5 - 5.1 mmol/L 4.2   4.2  3.6   Chloride 98 - 111 mmol/L 101  98  103   CO2 22 - 32 mmol/L 24  22  26    Calcium 8.9 - 10.3 mg/dL 8.8  9.5  9.9    DG Chest 1 View Result Date: 11/20/2023 CLINICAL DATA:  Status post fall. EXAM: CHEST  1 VIEW COMPARISON:  Sep 06, 2023 FINDINGS: The heart size and mediastinal contours are within normal limits. Mild, stable, diffuse, chronic appearing increased interstitial lung markings are seen. No acute infiltrate, pleural effusion or pneumothorax is identified. A compression fracture deformity of indeterminate age is seen at the level of L1. IMPRESSION: 1. Chronic appearing increased interstitial lung markings without acute or active cardiopulmonary disease. 2. L1 vertebral body compression fracture of indeterminate age. Correlation with physical examination is recommended to determine the presence of point tenderness. Electronically Signed   By: Suzen Dials M.D.   On: 11/20/2023 23:24   DG Hip Unilat  With Pelvis 2-3 Views Right  My interpretation of the x-ray that was read below by radiology is that the patient has a right intertrochanteric fracture appears to be a 2 part fracture and varus angulation there is also a left bipolar hip replacement noted and it is in good position   Result Date: 11/20/2023 CLINICAL DATA:  Status post fall. EXAM: DG HIP (WITH OR WITHOUT PELVIS) 2-3V RIGHT COMPARISON:  None Available. FINDINGS: An acute fracture deformity is seen extending through the inter trochanteric region of the proximal right femur. There is mild dorsal angulation of the fracture apex without evidence of dislocation. An intact left hip replacement is noted. IMPRESSION: Acute intertrochanteric fracture of the proximal right femur. Electronically Signed   By: Suzen Dials M.D.   On: 11/20/2023 23:22    Assessment and plan  Right hip fracture requires operative repair  Risks and benefits of surgery explained to the patient and her daughter as the patient has  dementia  Recommend internal fixation right hip with nail device

## 2023-11-21 NOTE — Assessment & Plan Note (Signed)
-   Will continue antihypertensive therapy. - Toprol -XL should offer perioperative cardiovascular risk reduction.

## 2023-11-22 ENCOUNTER — Inpatient Hospital Stay (HOSPITAL_COMMUNITY)

## 2023-11-22 ENCOUNTER — Other Ambulatory Visit: Payer: Self-pay

## 2023-11-22 ENCOUNTER — Encounter (HOSPITAL_COMMUNITY)
Admission: EM | Disposition: A | Discharge status: Skilled Nursing Facility | Payer: Self-pay | Source: Home / Self Care | Attending: Family Medicine

## 2023-11-22 ENCOUNTER — Inpatient Hospital Stay (HOSPITAL_COMMUNITY): Payer: Self-pay | Admitting: Anesthesiology

## 2023-11-22 ENCOUNTER — Encounter (HOSPITAL_COMMUNITY): Payer: Self-pay | Admitting: Family Medicine

## 2023-11-22 DIAGNOSIS — G8929 Other chronic pain: Secondary | ICD-10-CM

## 2023-11-22 DIAGNOSIS — S72001A Fracture of unspecified part of neck of right femur, initial encounter for closed fracture: Secondary | ICD-10-CM

## 2023-11-22 DIAGNOSIS — I10 Essential (primary) hypertension: Secondary | ICD-10-CM

## 2023-11-22 DIAGNOSIS — F32A Depression, unspecified: Secondary | ICD-10-CM

## 2023-11-22 DIAGNOSIS — E871 Hypo-osmolality and hyponatremia: Secondary | ICD-10-CM | POA: Diagnosis not present

## 2023-11-22 DIAGNOSIS — E039 Hypothyroidism, unspecified: Secondary | ICD-10-CM | POA: Diagnosis not present

## 2023-11-22 DIAGNOSIS — E43 Unspecified severe protein-calorie malnutrition: Secondary | ICD-10-CM | POA: Insufficient documentation

## 2023-11-22 DIAGNOSIS — M545 Low back pain, unspecified: Secondary | ICD-10-CM

## 2023-11-22 DIAGNOSIS — S72141A Displaced intertrochanteric fracture of right femur, initial encounter for closed fracture: Secondary | ICD-10-CM | POA: Diagnosis not present

## 2023-11-22 HISTORY — PX: INTRAMEDULLARY (IM) NAIL INTERTROCHANTERIC: SHX5875

## 2023-11-22 LAB — CBC
HCT: 32.6 % — ABNORMAL LOW (ref 36.0–46.0)
Hemoglobin: 11.4 g/dL — ABNORMAL LOW (ref 12.0–15.0)
MCH: 34.8 pg — ABNORMAL HIGH (ref 26.0–34.0)
MCHC: 35 g/dL (ref 30.0–36.0)
MCV: 99.4 fL (ref 80.0–100.0)
Platelets: 116 K/uL — ABNORMAL LOW (ref 150–400)
RBC: 3.28 MIL/uL — ABNORMAL LOW (ref 3.87–5.11)
RDW: 14.6 % (ref 11.5–15.5)
WBC: 8.1 K/uL (ref 4.0–10.5)
nRBC: 0 % (ref 0.0–0.2)

## 2023-11-22 LAB — CREATININE, SERUM
Creatinine, Ser: 0.47 mg/dL (ref 0.44–1.00)
GFR, Estimated: >60 mL/min (ref >60)

## 2023-11-22 SURGERY — FIXATION, FRACTURE, INTERTROCHANTERIC, WITH INTRAMEDULLARY ROD
Anesthesia: Spinal | Site: Hip | Laterality: Right

## 2023-11-22 MED ORDER — METOCLOPRAMIDE HCL 5 MG/ML IJ SOLN: 5.0000 mg | Freq: Three times a day (TID) | INTRAMUSCULAR | Status: DC | PRN

## 2023-11-22 MED ORDER — ORAL CARE MOUTH RINSE: 15.0000 mL | Freq: Once | OROMUCOSAL | Status: DC

## 2023-11-22 MED ORDER — ACETAMINOPHEN 500 MG PO TABS
500.0000 mg | ORAL_TABLET | Freq: Four times a day (QID) | ORAL | Status: AC
  Administered 2023-11-22 – 2023-11-23 (×4): 500 mg via ORAL
  Filled 2023-11-22 (×4): qty 1

## 2023-11-22 MED ORDER — CEFAZOLIN SODIUM-DEXTROSE 2-4 GM/100ML-% IV SOLN
2.0000 g | Freq: Four times a day (QID) | INTRAVENOUS | Status: AC
  Administered 2023-11-22 – 2023-11-23 (×2): 2 g via INTRAVENOUS
  Filled 2023-11-22 (×2): qty 100

## 2023-11-22 MED ORDER — ONDANSETRON HCL 4 MG/2ML IJ SOLN: 4.0000 mg | Freq: Four times a day (QID) | INTRAMUSCULAR | Status: DC | PRN

## 2023-11-22 MED ORDER — POVIDONE-IODINE 10 % EX SWAB: 2.0000 | Freq: Once | CUTANEOUS | Status: DC

## 2023-11-22 MED ORDER — DEXAMETHASONE SODIUM PHOSPHATE 10 MG/ML IJ SOLN
INTRAMUSCULAR | Status: AC
  Filled 2023-11-22: qty 1

## 2023-11-22 MED ORDER — METHOCARBAMOL 1000 MG/10ML IJ SOLN
500.0000 mg | Freq: Four times a day (QID) | INTRAMUSCULAR | Status: DC | PRN
  Administered 2023-11-23: 500 mg via INTRAVENOUS
  Filled 2023-11-22: qty 10

## 2023-11-22 MED ORDER — CEFAZOLIN SODIUM-DEXTROSE 2-4 GM/100ML-% IV SOLN
2.0000 g | INTRAVENOUS | Status: AC
  Administered 2023-11-22: 2 g via INTRAVENOUS

## 2023-11-22 MED ORDER — ONDANSETRON HCL 4 MG/2ML IJ SOLN
INTRAMUSCULAR | Status: DC | PRN
  Administered 2023-11-22: 4 mg via INTRAVENOUS

## 2023-11-22 MED ORDER — TRAMADOL HCL 50 MG PO TABS
50.0000 mg | ORAL_TABLET | Freq: Four times a day (QID) | ORAL | Status: DC
  Administered 2023-11-22 – 2023-11-26 (×8): 50 mg via ORAL
  Filled 2023-11-22 (×8): qty 1

## 2023-11-22 MED ORDER — PHENOL 1.4 % MT LIQD: 1.0000 | OROMUCOSAL | Status: DC | PRN

## 2023-11-22 MED ORDER — TRANEXAMIC ACID-NACL 1000-0.7 MG/100ML-% IV SOLN
1000.0000 mg | Freq: Once | INTRAVENOUS | Status: AC
  Administered 2023-11-22: 1000 mg via INTRAVENOUS
  Filled 2023-11-22: qty 100

## 2023-11-22 MED ORDER — EPHEDRINE 5 MG/ML INJ
INTRAVENOUS | Status: AC
  Filled 2023-11-22: qty 5

## 2023-11-22 MED ORDER — ROCURONIUM BROMIDE 10 MG/ML (PF) SYRINGE
PREFILLED_SYRINGE | INTRAVENOUS | Status: AC
  Filled 2023-11-22: qty 10

## 2023-11-22 MED ORDER — PROPOFOL 10 MG/ML IV BOLUS
INTRAVENOUS | Status: AC
  Filled 2023-11-22: qty 20

## 2023-11-22 MED ORDER — PROPOFOL 500 MG/50ML IV EMUL
INTRAVENOUS | Status: AC
  Filled 2023-11-22: qty 100

## 2023-11-22 MED ORDER — BUPIVACAINE-EPINEPHRINE (PF) 0.5% -1:200000 IJ SOLN
INTRAMUSCULAR | Status: AC
  Filled 2023-11-22: qty 30

## 2023-11-22 MED ORDER — FENTANYL CITRATE (PF) 100 MCG/2ML IJ SOLN
INTRAMUSCULAR | Status: AC
  Filled 2023-11-22: qty 2

## 2023-11-22 MED ORDER — CHLORHEXIDINE GLUCONATE 0.12 % MT SOLN
OROMUCOSAL | Status: AC
Start: 2023-11-22 — End: 2023-11-22
  Administered 2023-11-22: 15 mL
  Filled 2023-11-22: qty 15

## 2023-11-22 MED ORDER — LACTATED RINGERS IV SOLN: INTRAVENOUS | Status: DC

## 2023-11-22 MED ORDER — CHLORHEXIDINE GLUCONATE 0.12 % MT SOLN: 15.0000 mL | Freq: Once | OROMUCOSAL | Status: DC

## 2023-11-22 MED ORDER — SODIUM CHLORIDE 0.9 % IR SOLN
Status: DC | PRN
  Administered 2023-11-22: 1000 mL

## 2023-11-22 MED ORDER — LIDOCAINE 2% (20 MG/ML) 5 ML SYRINGE
INTRAMUSCULAR | Status: DC | PRN
  Administered 2023-11-22: 100 mg via INTRAVENOUS

## 2023-11-22 MED ORDER — ACETAMINOPHEN 325 MG PO TABS
325.0000 mg | ORAL_TABLET | Freq: Four times a day (QID) | ORAL | Status: DC | PRN
  Administered 2023-11-24: 650 mg via ORAL
  Filled 2023-11-22: qty 2

## 2023-11-22 MED ORDER — LIDOCAINE 2% (20 MG/ML) 5 ML SYRINGE
INTRAMUSCULAR | Status: AC
  Filled 2023-11-22: qty 5

## 2023-11-22 MED ORDER — ROPIVACAINE HCL 7.5 MG/ML IJ SOLN
INTRAMUSCULAR | Status: DC | PRN
Start: 2023-11-22 — End: 2023-11-22
  Administered 2023-11-22: 1.2 mL via PERINEURAL

## 2023-11-22 MED ORDER — ENSURE PLUS HIGH PROTEIN PO LIQD
237.0000 mL | Freq: Two times a day (BID) | ORAL | Status: DC
  Administered 2023-11-25 – 2023-11-28 (×6): 237 mL via ORAL

## 2023-11-22 MED ORDER — MORPHINE SULFATE (PF) 2 MG/ML IV SOLN: 0.5000 mg | INTRAVENOUS | Status: DC | PRN

## 2023-11-22 MED ORDER — EPHEDRINE SULFATE-NACL 50-0.9 MG/10ML-% IV SOSY
PREFILLED_SYRINGE | INTRAVENOUS | Status: DC | PRN
  Administered 2023-11-22 (×5): 5 mg via INTRAVENOUS

## 2023-11-22 MED ORDER — PROPOFOL 500 MG/50ML IV EMUL
INTRAVENOUS | Status: DC | PRN
  Administered 2023-11-22: 25 mg via INTRAVENOUS
  Administered 2023-11-22: 20 ug/kg/min via INTRAVENOUS
  Administered 2023-11-22: 25 mg via INTRAVENOUS

## 2023-11-22 MED ORDER — METOCLOPRAMIDE HCL 10 MG PO TABS: 5.0000 mg | ORAL_TABLET | Freq: Three times a day (TID) | ORAL | Status: DC | PRN

## 2023-11-22 MED ORDER — ENOXAPARIN SODIUM 30 MG/0.3ML IJ SOSY
30.0000 mg | PREFILLED_SYRINGE | INTRAMUSCULAR | Status: DC
  Administered 2023-11-23 – 2023-11-28 (×7): 30 mg via SUBCUTANEOUS
  Filled 2023-11-22 (×6): qty 0.3

## 2023-11-22 MED ORDER — METHOCARBAMOL 500 MG PO TABS
500.0000 mg | ORAL_TABLET | Freq: Four times a day (QID) | ORAL | Status: DC | PRN
  Administered 2023-11-24: 500 mg via ORAL
  Filled 2023-11-22: qty 1

## 2023-11-22 MED ORDER — DOCUSATE SODIUM 100 MG PO CAPS
100.0000 mg | ORAL_CAPSULE | Freq: Two times a day (BID) | ORAL | Status: DC
  Administered 2023-11-22 – 2023-11-28 (×11): 100 mg via ORAL
  Filled 2023-11-22 (×12): qty 1

## 2023-11-22 MED ORDER — CHLORHEXIDINE GLUCONATE 4 % EX SOLN: 60.0000 mL | Freq: Once | CUTANEOUS | Status: DC

## 2023-11-22 MED ORDER — MENTHOL 3 MG MT LOZG: 1.0000 | LOZENGE | OROMUCOSAL | Status: DC | PRN

## 2023-11-22 MED ORDER — HYDROCODONE-ACETAMINOPHEN 5-325 MG PO TABS
1.0000 | ORAL_TABLET | ORAL | Status: DC | PRN
  Administered 2023-11-23 – 2023-11-25 (×2): 1 via ORAL
  Filled 2023-11-22 (×2): qty 1

## 2023-11-22 MED ORDER — ONDANSETRON HCL 4 MG/2ML IJ SOLN
INTRAMUSCULAR | Status: AC
  Filled 2023-11-22: qty 2

## 2023-11-22 MED ORDER — PHENYLEPHRINE HCL-NACL 20-0.9 MG/250ML-% IV SOLN
INTRAVENOUS | Status: DC | PRN
  Administered 2023-11-22: 40 ug/min via INTRAVENOUS

## 2023-11-22 MED ORDER — SODIUM CHLORIDE 0.9 % IV SOLN: INTRAVENOUS | Status: AC

## 2023-11-22 MED ORDER — BUPIVACAINE-EPINEPHRINE (PF) 0.5% -1:200000 IJ SOLN
INTRAMUSCULAR | Status: DC | PRN
  Administered 2023-11-22: 30 mL via PERINEURAL

## 2023-11-22 MED ORDER — TRANEXAMIC ACID-NACL 1000-0.7 MG/100ML-% IV SOLN
1000.0000 mg | INTRAVENOUS | Status: AC
  Administered 2023-11-22: 1000 mg via INTRAVENOUS
  Filled 2023-11-22: qty 100

## 2023-11-22 MED ORDER — FENTANYL CITRATE (PF) 100 MCG/2ML IJ SOLN
INTRAMUSCULAR | Status: DC | PRN
  Administered 2023-11-22: 15 ug via INTRAVENOUS
  Administered 2023-11-22: 10 ug via INTRAVENOUS

## 2023-11-22 MED ORDER — CEFAZOLIN SODIUM-DEXTROSE 2-4 GM/100ML-% IV SOLN
INTRAVENOUS | Status: AC
Start: 2023-11-22 — End: 2023-11-22
  Filled 2023-11-22: qty 100

## 2023-11-22 MED ORDER — HYDROCODONE-ACETAMINOPHEN 7.5-325 MG PO TABS: 1.0000 | ORAL_TABLET | ORAL | Status: DC | PRN

## 2023-11-22 MED ORDER — ONDANSETRON HCL 4 MG PO TABS: 4.0000 mg | ORAL_TABLET | Freq: Four times a day (QID) | ORAL | Status: DC | PRN

## 2023-11-22 SURGICAL SUPPLY — 49 items
BENZOIN TINCTURE PRP APPL 2/3 (GAUZE/BANDAGES/DRESSINGS) IMPLANT
BIT DRILL AO GAMMA 4.2X300 (BIT) ×1 IMPLANT
BLADE SURG SZ10 CARB STEEL (BLADE) ×2 IMPLANT
BNDG GAUZE ELAST 4 BULKY (GAUZE/BANDAGES/DRESSINGS) ×1 IMPLANT
CHLORAPREP W/TINT 26 (MISCELLANEOUS) ×1 IMPLANT
CLOTH BEACON ORANGE TIMEOUT ST (SAFETY) ×1 IMPLANT
COUNTER NDL MAGNETIC 40 RED (SET/KITS/TRAYS/PACK) ×1 IMPLANT
COUNTER NEEDLE MAGNETIC 40 RED (SET/KITS/TRAYS/PACK) ×1 IMPLANT
COVER LIGHT HANDLE STERIS (MISCELLANEOUS) ×2 IMPLANT
COVER PERINEAL POST (MISCELLANEOUS) ×1 IMPLANT
DRAPE STERI IOBAN 125X83 (DRAPES) ×1 IMPLANT
DRSG MEPILEX POST OP 4X12 (GAUZE/BANDAGES/DRESSINGS) IMPLANT
DRSG MEPILEX SACRM 8.7X9.8 (GAUZE/BANDAGES/DRESSINGS) ×1 IMPLANT
DRSG PAD ABDOMINAL 8X10 ST (GAUZE/BANDAGES/DRESSINGS) ×1 IMPLANT
ELECTRODE REM PT RTRN 9FT ADLT (ELECTROSURGICAL) ×1 IMPLANT
GLOVE BIO SURGEON STRL SZ7 (GLOVE) IMPLANT
GLOVE BIOGEL PI IND STRL 7.0 (GLOVE) ×2 IMPLANT
GLOVE BIOGEL PI IND STRL 8.5 (GLOVE) ×1 IMPLANT
GLOVE SKINSENSE STRL SZ8.0 LF (GLOVE) ×1 IMPLANT
GOWN STRL REUS W/TWL LRG LVL3 (GOWN DISPOSABLE) ×1 IMPLANT
GOWN STRL REUS W/TWL XL LVL3 (GOWN DISPOSABLE) ×1 IMPLANT
GUIDEROD T2 3X1000 (ROD) ×1 IMPLANT
INST SET MAJOR BONE (KITS) ×1 IMPLANT
KIT TURNOVER CYSTO (KITS) ×1 IMPLANT
KWIRE 3.2X450M STR (WIRE) ×1 IMPLANT
MANIFOLD NEPTUNE II (INSTRUMENTS) ×1 IMPLANT
MARKER SKIN DUAL TIP RULER LAB (MISCELLANEOUS) ×1 IMPLANT
NAIL TROCH GAMMA 11X18 (Nail) IMPLANT
NDL HYPO 21X1.5 SAFETY (NEEDLE) ×1 IMPLANT
NDL SPNL 18GX3.5 QUINCKE PK (NEEDLE) ×1 IMPLANT
NEEDLE HYPO 21X1.5 SAFETY (NEEDLE) ×1 IMPLANT
NEEDLE SPNL 18GX3.5 QUINCKE PK (NEEDLE) ×1 IMPLANT
NS IRRIG 1000ML POUR BTL (IV SOLUTION) ×1 IMPLANT
PACK SRG BSC III STRL LF ECLPS (CUSTOM PROCEDURE TRAY) ×1 IMPLANT
PAD ARMBOARD POSITIONER FOAM (MISCELLANEOUS) ×1 IMPLANT
PENCIL SMOKE EVACUATOR COATED (MISCELLANEOUS) ×1 IMPLANT
POSITIONER HEAD 8X9X4 ADT (SOFTGOODS) ×1 IMPLANT
SCREW LAG GAMMA 3 TI 10.5X85MM (Screw) IMPLANT
SCREW LOCKING T2 F/T 5MMX40MM (Screw) IMPLANT
SET BASIN LINEN APH (SET/KITS/TRAYS/PACK) ×1 IMPLANT
SPONGE T-LAP 18X18 ~~LOC~~+RFID (SPONGE) ×2 IMPLANT
STRIP CLOSURE SKIN 1/2X4 (GAUZE/BANDAGES/DRESSINGS) IMPLANT
SUT BRALON NAB BRD #1 30IN (SUTURE) ×1 IMPLANT
SUT MON AB 0 CT1 (SUTURE) IMPLANT
SUT MON AB 2-0 CT1 36 (SUTURE) ×1 IMPLANT
SYR 30ML LL (SYRINGE) ×1 IMPLANT
SYR BULB IRRIG 60ML STRL (SYRINGE) ×2 IMPLANT
TRAY FOLEY MTR SLVR 16FR STAT (SET/KITS/TRAYS/PACK) ×1 IMPLANT
YANKAUER SUCT BULB TIP NO VENT (SUCTIONS) ×1 IMPLANT

## 2023-11-22 NOTE — Progress Notes (Signed)
 Progress Note   Patient: Lauren Lawrence FMW:[id number] DOB: 1941/02/27 DOA: 11/20/2023     1 DOS: the patient was seen and examined on 11/22/2023   Brief hospital admission narrative: As per H&P written by Dr. Lawence on a [name] is a 83 y.o. Caucasian female with medical history significant for hypertension, dyslipidemia and depression, who presented to the emergency room with acute onset of fall.  The patient was getting undressed to get ready for bed when she lost her balance and landed on her right hip with subsequent acute pain.  She denies any head injury.  No paresthesias or focal muscle weakness.  No presyncope or syncope.  No chest pain or palpitations.  No fever or chills.  No cough or wheezing or dyspnea.  No bleeding diathesis.  She is status post left hip hemiarthroplasty for previous history of left hip fracture.  The patient had another fall in May per her daughter and has been having chronic back pain since then.   ED Course: Upon presentation to the emergency room, BP was 167/82 with otherwise normal vital signs.  Labs revealed a sodium of 132 and blood glucose of 185 with albumin  of 3.1.  AST was 73 and ALT 48 with total protein 5.7 and total bili 2.2. EKG as reviewed by me : EKG showed normal sinus rhythm with a rate of 65 with poor R wave progression and Q waves inferiorly Imaging: 1. Chronic appearing increased interstitial lung markings without acute or active cardiopulmonary disease. 2. L1 vertebral body compression fracture of indeterminate age. Correlation with physical examination is recommended to determine the presence of point tenderness.  Assessment and plan  Closed right hip fracture, initial encounter (HCC) - Reporting pain in her right hip - Hemodynamically stable - Average risk for surgical intervention - Continue supportive care, follow electrolytes and hemoglobin trend. - Appreciate assistance and recommendation by orthopedic service; plan is for  surgical repair (ORIF) later today.  Hypothyroidism -Continue Synthroid .  Essential hypertension -Continue current antihypertensive therapy - Vital signs stable.  Elevated LFTs -In the setting of a stress demargination most likely - Continue maintaining adequate hydration - Follow LFTs.  Hyponatremia -In the setting of mild dehydration - Gentle fluid has been provided - Will follow electrolytes trend.  Chronic back pain - This is likely secondary to her old L1 vertebral compression fracture. -Continue as needed analgesics.  Subjective:  Reporting pain in her right hip; hemodynamically stable.  No nausea, no vomiting, no chest pain.  Physical Exam: Vitals:   11/21/23 1528 11/21/23 2006 11/22/23 0345 11/22/23 1240  BP: (!) 168/65 (!) 165/55 (!) 120/46 (!) 158/65  Pulse: (!) 53 (!) 56 (!) 47 (!) 53  Resp:  14 16   Temp: 97.8 F (36.6 C) 97.6 F (36.4 C) 97.7 F (36.5 C) (!) 87 F (30.6 C)  TempSrc: Oral Oral Oral Oral  SpO2: 98% 100% 97% 99%  Weight:      Height:       General exam: Alert, awake, oriented x 3; in no acute distress. Respiratory system: Clear to auscultation. Respiratory effort normal.  Good saturation on room air. Cardiovascular system:RRR.  No rubs or gallops. Gastrointestinal system: Abdomen is nondistended, soft and nontender. No organomegaly or masses felt. Normal bowel sounds heard. Central nervous system: No focal neurological deficits. Extremities: No cyanosis or clubbing. Skin: No petechiae. Psychiatry: Judgement and insight appear normal. Mood & affect appropriate.    Data Reviewed: CBC: White blood cells 8.8, hemoglobin  11.5 and platelet count 145K Basic metabolic panel: Sodium 133, potassium 4.2, chloride 101, bicarb 24, BUN 9, creatinine 0.49 and GFR >60  Family Communication: Daughter is at bedside.  Disposition: Status is: Inpatient Remains inpatient appropriate because: Continue current plan of care for surgical repair of hip  fracture.  Most likely discharge to skilled nursing facility for short-term rehabilitation after repair.  Follow assessment and recommendations by PT/OT after surgery.  Time spent: 50 minutes  Author: Eric Nunnery, MD 11/22/2023 2:12 PM  For on call review [email or web address].

## 2023-11-22 NOTE — Brief Op Note (Signed)
 11/20/2023 - 11/22/2023  3:21 PM  PATIENT:  Lauren Lawrence  83 y.o. female  PRE-OPERATIVE DIAGNOSIS:  right hip fracture  POST-OPERATIVE DIAGNOSIS:  right hip fracture  PROCEDURE:  Procedure(s): FIXATION, FRACTURE, INTERTROCHANTERIC, WITH INTRAMEDULLARY ROD (Right)  SURGEON:  Surgeons and Role:    * Margrette Taft BRAVO, MD - Primary  PHYSICIAN ASSISTANT:   ASSISTANTS: none   ANESTHESIA:   spinal  EBL:  50 mL   BLOOD ADMINISTERED:none  DRAINS: none   LOCAL MEDICATIONS USED:  MARCAINE      SPECIMEN:  No Specimen  DISPOSITION OF SPECIMEN:  N/A  COUNTS:  YES  TOURNIQUET:  * No tourniquets in log *  DICTATION: .Dragon Dictation  PLAN OF CARE: Admit to inpatient   PATIENT DISPOSITION:  PACU - hemodynamically stable.   Delay start of Pharmacological VTE agent (>24hrs) due to surgical blood loss or risk of bleeding: yes

## 2023-11-22 NOTE — Transfer of Care (Signed)
 Immediate Anesthesia Transfer of Care Note  Patient: Lauren Lawrence  Procedure(s) Performed: FIXATION, FRACTURE, INTERTROCHANTERIC, WITH INTRAMEDULLARY ROD (Right: Hip)  Patient Location: PACU  Anesthesia Type:Spinal  Level of Consciousness: awake  Airway & Oxygen Therapy: Patient Spontanous Breathing  Post-op Assessment: Report given to RN and Post -op Vital signs reviewed and stable  Post vital signs: Reviewed and stable  Last Vitals:  Vitals Value Taken Time  BP 105/59   Temp 97.6   Pulse 55 11/22/23 15:24  Resp 13   SpO2 97 % 11/22/23 15:24  Vitals shown include unfiled device data.  Last Pain:  Vitals:   11/22/23 1240  TempSrc: Oral  PainSc: 0-No pain      Patients Stated Pain Goal: 5 (11/22/23 1240)  Complications: No notable events documented.

## 2023-11-22 NOTE — Anesthesia Procedure Notes (Signed)
 Spinal  Patient location during procedure: OR Start time: 11/22/2023 1:50 PM End time: 11/22/2023 2:00 PM Reason for block: surgical anesthesia Staffing Performed: anesthesiologist  Performed by: Herschell Hollering, MD Authorized by: Herschell Hollering, MD   Preanesthetic Checklist Completed: patient identified, IV checked, site marked, risks and benefits discussed, surgical consent, monitors and equipment checked, pre-op evaluation and timeout performed Spinal Block Patient position: left lateral decubitus Prep: Betadine  Patient monitoring: heart rate, cardiac monitor, continuous pulse ox and blood pressure Approach: midline Location: L2-3 Injection technique: single-shot Needle Needle type: Sprotte  Needle gauge: 22 G Needle length: 5 cm Needle insertion depth: 3 cm

## 2023-11-22 NOTE — Plan of Care (Signed)

## 2023-11-22 NOTE — Op Note (Signed)
 11/22/2023  3:21 PM  PATIENT:  Lauren Lawrence  83 y.o. female  PRE-OPERATIVE DIAGNOSIS:  right hip fracture  POST-OPERATIVE DIAGNOSIS:  right hip fracture  PROCEDURE:  Procedure(s): FIXATION, FRACTURE, INTERTROCHANTERIC, WITH INTRAMEDULLARY ROD (Right)  SURGEON:  Surgeons and Role:    * Margrette Taft BRAVO, MD - Primary  PHYSICIAN ASSISTANT:   ASSISTANTS: none   ANESTHESIA:   spinal  EBL:  50 mL   BLOOD ADMINISTERED:none  DRAINS: none   LOCAL MEDICATIONS USED:  MARCAINE      SPECIMEN:  No Specimen  DISPOSITION OF SPECIMEN:  N/A  COUNTS:  YES  TOURNIQUET:  * No tourniquets in log *  DICTATION: .Dragon Dictation  PLAN OF CARE: Admit to inpatient   PATIENT DISPOSITION:  PACU - hemodynamically stable.   Delay start of Pharmacological VTE agent (>24hrs) due to surgical blood loss or risk of bleeding: yes  Open treatment internal fixation of the hip with CEPHALOMEDULLARY nail  Orthopaedic Surgery Operative Note (CSN: [id number])  Lauren Lawrence  1940/07/01 Date of Surgery: 11/20/2023 - 11/22/2023    Location: [facility] OR ROOM 4     Implants: Implant Name Type Inv. Item Serial No. Manufacturer Lot No. LRB No. Used Action  NAIL [facility] GAMMA [id number] - [id number] Nail NAIL TROCH GAMMA 11X18  [employer] [id number] Right 1 Implanted  SCREW LAG GAMMA 3 TI 10.5X85MM - [id number] Screw SCREW LAG GAMMA 3 TI 10.5X85MM  [employer] [id number] Right 1 Implanted  SCREW LOCKING T2 F/T 5MMX40MM - [id number] Screw SCREW LOCKING T2 F/T 5MMX40MM  [employer] [id number] Right 1 Implanted        The surgery was done as follows: The patient was identified in the preop area the surgical site (RIGHT HIP ) was confirmed and marked after thorough chart review including radiographs; implants were checked personally.  The patient was brought back to the operating room for anesthesia and then was placed on the fracture table. The operative leg was placed in traction the nonoperative leg  was padded and placed in a boot and abducted  The C-arm was brought in and a closed reduction was performed with the C-arm.  Multiplane x-rays were taken and the leg was manipulated until a stable reduction was obtained using traction to obtain proper neck shaft angle and then rotation to correct rotational malalignment  The RIGHT  Leg was prepped and draped STERILE TTECHNIQUE. This was followed by timeout which confirmed surgical site, implants, x-ray gowns and badges.  The incision was made over the RIGHT hip PROXIMAL TO THE  greater trochanter,  the subcutaneous tissue was divided down to the fascial layer which was split in line with the skin and incision.  This was followed by blunt dissection with a finger down to the tip of the trochanter  The sharp tipped awl was passed down to the level of the lesser TROCHANTER  followed by insertion of a guidewire down to the knee.  X-ray confirmed position of the guidewire and proximal reaming was performed down to the level of the lesser trochanter.  We then passed a  125 degree nail over the guide wire.  A second incision was made distal to the initial incision and the subcutaneous tissue was divided, muscle and fascia were split down to bone   After correction for rotation (a perforating drill was used to breech the cortex of the lateral femur followed by) insertion of a threaded guidewire which was placed in the center of the femoral head on [facility]  and lateral x-ray  We measured the pin distance at 85 and then set the reamer to the same number  (85)  followed by reaming over the guidewire.  The lag screw was then placed over the guidewire and the guidewire was removed.   We next used the external guide to place a third incision and then passed a cannula with a sharp tip followed by drilling with a  3.2 drill bit and a 40 locking screw was placed Final images confirmed reduction of the fracture and hardware position.  The wound was irrigated with copious  amounts of saline and closed in layered fashion starting with  #1 Bralon, 0 Monocryl and 2-0 monocryl proximally and 2-0 monocryl distally   We used 30 ml of Marcaine  with epinephrine  dividing it between each layer  A sterile bandage was applied  Postoperative plan Weightbearing as tolerated Anticoagulation for 28 days Follow-up visit at 4 weeks for x-rays and then x-rays  and 12 weeks [id number]

## 2023-11-22 NOTE — Plan of Care (Signed)
 ?  Problem: Clinical Measurements: ?Goal: Will remain free from infection ?Outcome: Progressing ?  ?

## 2023-11-22 NOTE — Progress Notes (Signed)
 Initial Nutrition Assessment  DOCUMENTATION CODES:   Severe malnutrition in context of chronic illness, Underweight  INTERVENTION:   Ensure Plus High Protein po BID, each supplement provides 350 kcal and 20 grams of protein Magic cup TID with meals, each supplement provides 290 kcal and 9 grams of protein Continue MVI with minerals daily  NUTRITION DIAGNOSIS:   Severe Malnutrition related to chronic illness as evidenced by severe muscle depletion, severe fat depletion, energy intake < or equal to 75% for > or equal to 1 month, percent weight loss (17% weight loss x 6 months).  GOAL:   Patient will meet greater than or equal to 90% of their needs  MONITOR:   Diet advancement, PO intake, Supplement acceptance  REASON FOR ASSESSMENT:   Consult Hip fracture protocol  ASSESSMENT:   83 yo female admitted with R hip fracture S/P fall at home. PMH includes HLD, HTN, depression, memory loss.  Spoke with patient and her family at bedside. They report that patient has lost a lot of weight since mid-April when she was started on an antibiotic. He weight loss was gradual prior to that d/t disinterest in food, but has lost 30-35 lbs since April. Intake at home was a bite or two of each meal in addition to trying supplements, such as [place], Ensure, Boost, high protein yogurt, and high protein chocolate and strawberry milk. She grew tired of the supplements quickly. She chews slower than she used to, but has no difficulty with swallowing. She is willing to try magic cups with meals when diet is advanced. Currently NPO for hip surgery today.  Labs reviewed. Na 133  Medications reviewed and include ferrous sulfate , MVI with minerals.  Weight history reviewed.  Patient with 17% weight loss within the past 6 months.   Patient meets criteria for severe malnutrition, given severe depletion of muscle and subcutaneous fat mass, 17% weight loss x 6 months, and intake meeting </=75% of  estimated energy needs for >/= 1 month.  NUTRITION - FOCUSED PHYSICAL EXAM:  Flowsheet Row Most Recent Value  Orbital Region Severe depletion  Upper Arm Region Severe depletion  Thoracic and Lumbar Region Severe depletion  Buccal Region Severe depletion  Temple Region Severe depletion  Clavicle Bone Region Severe depletion  Clavicle and Acromion Bone Region Severe depletion  Scapular Bone Region Severe depletion  Dorsal Hand Severe depletion  Patellar Region Unable to assess  Anterior Thigh Region Unable to assess  Posterior Calf Region Unable to assess  Edema (RD Assessment) Unable to assess  Hair Reviewed  Eyes Reviewed  Mouth Reviewed  Skin Reviewed  Nails Reviewed    Diet Order:   Diet Order             Diet NPO time specified  Diet effective midnight                   EDUCATION NEEDS:   No education needs have been identified at this time  Skin:  Skin Assessment: Reviewed RN Assessment  Last BM:  8/4  Height:   Ht Readings from Last 1 Encounters:  11/20/23 5' 4 (1.626 m)    Weight:   Wt Readings from Last 1 Encounters:  11/21/23 43.2 kg    Ideal Body Weight:  54.5 kg  BMI:  Body mass index is 16.35 kg/m.  Estimated Nutritional Needs:   Kcal:  1300-1500  Protein:  65-75 gm  Fluid:  1.5 L   Suzen HUNT RD, LDN, CNSC Contact via  secure chat. If unavailable, use group chat RD Inpatient.

## 2023-11-22 NOTE — Anesthesia Preprocedure Evaluation (Addendum)
 Anesthesia Evaluation  Patient identified by MRN, date of birth, ID band Patient confused    Reviewed: Allergy & Precautions, H&P , NPO status , Patient's Chart, lab work & pertinent test results  Airway Mallampati: II  TM Distance: >3 FB Neck ROM: Full    Dental no notable dental hx.    Pulmonary neg pulmonary ROS   Pulmonary exam normal breath sounds clear to auscultation       Cardiovascular hypertension, Normal cardiovascular exam Rhythm:Regular Rate:Normal     Neuro/Psych  PSYCHIATRIC DISORDERS  Depression   Dementia negative neurological ROS     GI/Hepatic negative GI ROS, Neg liver ROS,,,  Endo/Other  Hypothyroidism    Renal/GU negative Renal ROS  negative genitourinary   Musculoskeletal negative musculoskeletal ROS (+)    Abdominal   Peds negative pediatric ROS (+)  Hematology  (+) Blood dyscrasia, anemia   Anesthesia Other Findings   Reproductive/Obstetrics negative OB ROS                              Anesthesia Physical Anesthesia Plan  ASA: 3  Anesthesia Plan: Spinal   Post-op Pain Management:    Induction:   PONV Risk Score and Plan:   Airway Management Planned:   Additional Equipment:   Intra-op Plan:   Post-operative Plan:   Informed Consent: I have reviewed the patients History and Physical, chart, labs and discussed the procedure including the risks, benefits and alternatives for the proposed anesthesia with the patient or authorized representative who has indicated his/her understanding and acceptance.     Dental advisory given  Plan Discussed with: CRNA  Anesthesia Plan Comments:          Anesthesia Quick Evaluation

## 2023-11-22 NOTE — Anesthesia Postprocedure Evaluation (Signed)
 Anesthesia Post Note  Patient: Lauren Lawrence  Procedure(s) Performed: FIXATION, FRACTURE, INTERTROCHANTERIC, WITH INTRAMEDULLARY ROD (Right: Hip)  Patient location during evaluation: PACU Anesthesia Type: Spinal Level of consciousness: oriented and awake and alert Pain management: pain level controlled Vital Signs Assessment: post-procedure vital signs reviewed and stable Respiratory status: spontaneous breathing, respiratory function stable and patient connected to nasal cannula oxygen Cardiovascular status: blood pressure returned to baseline and stable Postop Assessment: no headache, no backache and no apparent nausea or vomiting Anesthetic complications: no   No notable events documented.   Last Vitals:  Vitals:   11/22/23 1530 11/22/23 1545  BP: (!) 144/54 (!) 146/63  Pulse: (!) 53 (!) 49  Resp:  11  Temp:    SpO2: 95% 98%    Last Pain:  Vitals:   11/22/23 1551  TempSrc:   PainSc: 0-No pain                 Andrea Limes

## 2023-11-22 NOTE — Interval H&P Note (Signed)
 History and Physical Interval Note:  11/22/2023 1:10 PM  Lauren Lawrence  has presented today for surgery, with the diagnosis of right hip fracture.  The various methods of treatment have been discussed with the patient and family. After consideration of risks, benefits and other options for treatment, the patient has consented to  Procedure(s): FIXATION, FRACTURE, INTERTROCHANTERIC, WITH INTRAMEDULLARY ROD (Right) as a surgical intervention.  The patient's history has been reviewed, patient examined, no change in status, stable for surgery.  I have reviewed the patient's chart and labs.  Questions were answered to the patient's satisfaction.     [name]

## 2023-11-23 ENCOUNTER — Encounter (HOSPITAL_COMMUNITY): Payer: Self-pay | Admitting: Orthopedic Surgery

## 2023-11-23 DIAGNOSIS — S72141A Displaced intertrochanteric fracture of right femur, initial encounter for closed fracture: Secondary | ICD-10-CM | POA: Diagnosis not present

## 2023-11-23 LAB — CBC
HCT: 29.4 % — ABNORMAL LOW (ref 36.0–46.0)
Hemoglobin: 10.3 g/dL — ABNORMAL LOW (ref 12.0–15.0)
MCH: 34.8 pg — ABNORMAL HIGH (ref 26.0–34.0)
MCHC: 35 g/dL (ref 30.0–36.0)
MCV: 99.3 fL (ref 80.0–100.0)
Platelets: 105 K/uL — ABNORMAL LOW (ref 150–400)
RBC: 2.96 MIL/uL — ABNORMAL LOW (ref 3.87–5.11)
RDW: 14.6 % (ref 11.5–15.5)
WBC: 7.4 K/uL (ref 4.0–10.5)
nRBC: 0 % (ref 0.0–0.2)

## 2023-11-23 LAB — BASIC METABOLIC PANEL WITH GFR
Anion gap: 9 (ref 5–15)
BUN: 15 mg/dL (ref 8–23)
CO2: 18 mmol/L — ABNORMAL LOW (ref 22–32)
Calcium: 7.9 mg/dL — ABNORMAL LOW (ref 8.9–10.3)
Chloride: 104 mmol/L (ref 98–111)
Creatinine, Ser: 0.53 mg/dL (ref 0.44–1.00)
GFR, Estimated: >60 mL/min (ref >60)
Glucose, Bld: 81 mg/dL (ref 70–99)
Potassium: 3.7 mmol/L (ref 3.5–5.1)
Sodium: 131 mmol/L — ABNORMAL LOW (ref 135–145)

## 2023-11-23 MED ORDER — DEXTROSE IN LACTATED RINGERS 5 % IV SOLN: INTRAVENOUS | Status: AC

## 2023-11-23 MED ORDER — BUPIVACAINE HCL (PF) 0.75 % IJ SOLN
INTRAMUSCULAR | Status: DC | PRN
  Administered 2023-11-22: 1.2 mL

## 2023-11-23 MED ORDER — METOPROLOL SUCCINATE ER 25 MG PO TB24
25.0000 mg | ORAL_TABLET | Freq: Every day | ORAL | Status: DC
  Administered 2023-11-24 – 2023-11-28 (×6): 25 mg via ORAL
  Filled 2023-11-23 (×5): qty 1

## 2023-11-23 NOTE — Progress Notes (Signed)
 Patient ID: Lauren Lawrence, female   DOB: Jul 02, 1940, 83 y.o.   MRN: [id number]  POD # 1 RT IM NAIL  DX RT INTERTROCHANTERIC FRX.  BP 126/71 (BP Location: Right Arm)   Pulse (!) 58   Temp 97.6 F (36.4 C) (Oral)   Resp 16   Ht 5' 4 (1.626 m)   Wt 43.2 kg   SpO2 98%   BMI 16.35 kg/m   CBC 11.4  DRESSING DRY  THIGH NO SWELLING   LEG ALIGNMENT NORMAL   NO CALF SWELLING OR TENDERNESS  S/P IM NAIL RT HIP  STABLE

## 2023-11-23 NOTE — Progress Notes (Signed)
 No response from MD Shahmehdi regarding no urine output and poor po intake. Information passed to oncoming nurse Andrea Mayer, LPN with nighttime MD notified of same. Daughters aware of waiting MD response and state understanding.

## 2023-11-23 NOTE — Evaluation (Signed)
 Physical Therapy Evaluation Patient Details Name: Lauren Lawrence MRN: [id number] DOB: 06/15/1940 Today's Date: 11/23/2023  History of Present Illness  Lauren Lawrence is a 83 y.o. Caucasian female with medical history significant for hypertension, dyslipidemia and depression, who presented to the emergency room with acute onset of fall.  The patient was getting undressed to get ready for bed when she lost her balance and landed on her right hip with subsequent acute pain.  She denies any head injury.  No paresthesias or focal muscle weakness.  No presyncope or syncope.  No chest pain or palpitations.  No fever or chills.  No cough or wheezing or dyspnea.  No bleeding diathesis.  She is status post left hip hemiarthroplasty for previous history of left hip fracture.  The patient had another fall in May per her daughter and has been having chronic back pain since then. Patient s/p FIXATION, FRACTURE, INTERTROCHANTERIC, WITH INTRAMEDULLARY ROD (Right) on 11/22/23.   Clinical Impression  Patient demonstrates slow labored movement for sitting up at bedside requiring maxA to pull to sit and scoot EOB. Patient requires increased time, much assistance and frequent verbal and tactile cues to complete most functional mobility tasks Patient limited to lateral side stepping at bedside secondary to Rt hip pain and weakness. She is limited mostly by pain and inconsistent ability to follow commands, though effortful. Patient will benefit from continued skilled physical therapy in hospital and recommended venue below to increase strength, balance, endurance for safe ADLs and gait.         If plan is discharge home, recommend the following: A lot of help with walking and/or transfers;A lot of help with bathing/dressing/bathroom;Assist for transportation;Help with stairs or ramp for entrance   Can travel by private vehicle   No    Equipment Recommendations None recommended by PT  Recommendations for Other Services        Functional Status Assessment Patient has had a recent decline in their functional status and demonstrates the ability to make significant improvements in function in a reasonable and predictable amount of time.     Precautions / Restrictions Precautions Precautions: Fall Recall of Precautions/Restrictions: Impaired Restrictions Weight Bearing Restrictions Per Provider Order: Yes RLE Weight Bearing Per Provider Order: Weight bearing as tolerated      Mobility  Bed Mobility Overal bed mobility: Needs Assistance Bed Mobility: Supine to Sit     Supine to sit: Max assist     General bed mobility comments: slow labored movement; difficulty with commands; much assist to pull to sit and scoot EOB    Transfers Overall transfer level: Needs assistance Equipment used: Rolling walker (2 wheels) Transfers: Sit to/from Stand, Bed to chair/wheelchair/BSC Sit to Stand: Mod assist, Max assist   Step pivot transfers: Max assist, Mod assist       General transfer comment: increased time, labored movement; difficulty following commands; verbal and tactile cues for use of RW    Ambulation/Gait Ambulation/Gait assistance: Max assist Gait Distance (Feet): 3 Feet Assistive device: Rolling walker (2 wheels) Gait Pattern/deviations: Decreased step length - right, Decreased step length - left, Decreased stance time - right, Decreased stride length Gait velocity: decreased     General Gait Details: limited to side steps at bedside due to in pain and difficulty following commands; frequent verbal and tactile cues for advancing BIL LEs  Stairs            Wheelchair Mobility     Tilt Bed    Modified Rankin (  Stroke Patients Only)       Balance Overall balance assessment: Needs assistance Sitting-balance support: Feet supported, Single extremity supported Sitting balance-Leahy Scale: Poor Sitting balance - Comments: seated EOB, posterior lean Postural control: Posterior lean    Standing balance-Leahy Scale: Poor Standing balance comment: using RW, posterior lean                             Pertinent Vitals/Pain Pain Assessment Pain Assessment: Faces Faces Pain Scale: Hurts even more Pain Location: Right hip/thigh Pain Descriptors / Indicators: Grimacing, Guarding Pain Intervention(s): Limited activity within patient's tolerance, Monitored during session, Repositioned    Home Living Family/patient expects to be discharged to:: Private residence Living Arrangements: Children Available Help at Discharge: Family;Available 24 hours/day Type of Home: House Home Access: Stairs to enter Entrance Stairs-Rails: Right Entrance Stairs-Number of Steps: 3   Home Layout: One level Home Equipment: Agricultural consultant (2 wheels);Cane - quad;Shower seat Additional Comments: Daughters reports between the two of them, someone is available 24/7    Prior Function Prior Level of Function : Needs assist  Cognitive Assist : ADLs (cognitive)   ADLs (Cognitive): Step by step cues Physical Assist : Mobility (physical);ADLs (physical) Mobility (physical): Transfers;Gait;Stairs ADLs (physical): Grooming;Bathing;Dressing;Toileting;IADLs Mobility Comments: Reports holding onto rails in house with AD, per daughter uses RW and cane when reminded in home but always uses in community ADLs Comments: Assistance from daughter to complete ADLs and IADLs     Extremity/Trunk Assessment        Lower Extremity Assessment Lower Extremity Assessment: RLE deficits/detail;Generalized weakness RLE Deficits / Details: Right hip pain/weakness RLE Sensation: WNL    Cervical / Trunk Assessment Cervical / Trunk Assessment: Kyphotic  Communication   Communication Communication: Impaired Factors Affecting Communication: Difficulty expressing self    Cognition Arousal: Alert Behavior During Therapy: Flat affect   PT - Cognitive impairments: History of cognitive impairments                        PT - Cognition Comments: daughters present to provide additional information Following commands: Impaired Following commands impaired: Follows one step commands inconsistently     Cueing Cueing Techniques: Verbal cues, Tactile cues     General Comments General comments (skin integrity, edema, etc.): bandage intact    Exercises     Assessment/Plan    PT Assessment Patient needs continued PT services  PT Problem List Decreased strength;Decreased balance;Decreased cognition;Decreased activity tolerance;Decreased mobility;Decreased range of motion       PT Treatment Interventions DME instruction;Therapeutic activities;Gait training;Therapeutic exercise;Patient/family education;Stair training;Balance training;Functional mobility training    PT Goals (Current goals can be found in the Care Plan section)  Acute Rehab PT Goals Patient Stated Goal: return home after rehab PT Goal Formulation: With patient/family Time For Goal Achievement: 12/07/23 Potential to Achieve Goals: Good    Frequency Min 3X/week     Co-evaluation               AM-PAC PT 6 Clicks Mobility  Outcome Measure Help needed turning from your back to your side while in a flat bed without using bedrails?: A Lot Help needed moving from lying on your back to sitting on the side of a flat bed without using bedrails?: A Lot Help needed moving to and from a bed to a chair (including a wheelchair)?: A Lot Help needed standing up from a chair using your arms (e.g., wheelchair or bedside  chair)?: A Lot Help needed to walk in hospital room?: A Lot Help needed climbing 3-5 steps with a railing? : A Lot 6 Click Score: 12    End of Session Equipment Utilized During Treatment: Gait belt Activity Tolerance: Patient limited by pain Patient left: in chair;with family/visitor present;with call bell/phone within reach;with bed alarm set Nurse Communication: Mobility status PT Visit  Diagnosis: Unsteadiness on feet (R26.81);Muscle weakness (generalized) (M62.81);Other abnormalities of gait and mobility (R26.89)    Time: 9050-8981 PT Time Calculation (min) (ACUTE ONLY): 29 min   Charges:   PT Evaluation $PT Eval Moderate Complexity: 1 Mod PT Treatments $Therapeutic Activity: 23-37 mins PT General Charges $$ ACUTE PT VISIT: 1 Visit         12:26 PM, 11/23/23,  Tranesha Lessner, SPT

## 2023-11-23 NOTE — NC FL2 (Signed)
 [place]  MEDICAID FL2 LEVEL OF CARE FORM     IDENTIFICATION  Patient Name: Lauren Lawrence Birthdate: 1940/12/30 Sex: female Admission Date (Current Location): 11/20/2023  [facility] and [place] Number:  [employer] and Address:  [facility],  [address], [place] [address]      Provider Number: [id number]  Attending Physician Name and Address:  Willette Adriana LABOR, MD  Relative Name and Phone Number:       Current Level of Care: Hospital Recommended Level of Care: Skilled Nursing Facility Prior Approval Number:    Date Approved/Denied:   PASRR Number: [id number]  Discharge Plan: SNF    Current Diagnoses: Patient Active Problem List   Diagnosis Date Noted   Protein-calorie malnutrition, severe 11/22/2023   Displaced intertrochanteric fracture of right femur, initial encounter for closed fracture (HCC) 11/21/2023   Hypothyroidism 11/21/2023   Essential hypertension 11/21/2023   Elevated LFTs 11/21/2023   Hyponatremia 11/21/2023   Chronic back pain 11/21/2023   Displaced fracture of left femoral neck (HCC) 10/14/2022   Hypertensive urgency 10/14/2022   Dementia without behavioral disturbance (HCC) 10/14/2022   Iron  deficiency anemia 05/14/2022   Maculopapular rash, localized 07/03/2021   Memory loss 05/11/2021   Thrombocytopenia (HCC) 07/10/2019   Skin trauma 07/10/2019   Allergic rhinitis 10/17/2014   Cyst of thyroid  10/17/2014   Clinical depression 10/17/2014   Essential (primary) hypertension 10/17/2014   Fatty infiltration of liver 10/17/2014   Acquired hypothyroidism 10/17/2014   Leukopenia 10/17/2014   OP (osteoporosis) 10/17/2014   Enlargement of spleen 10/17/2014   Phlebectasia 10/17/2014   Avitaminosis D 10/17/2014   HLD (hyperlipidemia) 07/25/2014    Orientation RESPIRATION BLADDER Height & Weight     Self  Normal Continent Weight: 95 lb 3.8 oz (43.2 kg) Height:  5' 4 (162.6 cm)  BEHAVIORAL SYMPTOMS/MOOD NEUROLOGICAL  BOWEL NUTRITION STATUS      Continent Diet (Regular)  AMBULATORY STATUS COMMUNICATION OF NEEDS Skin   Extensive Assist Verbally Normal                       Personal Care Assistance Level of Assistance  Bathing, Feeding, Dressing Bathing Assistance: Limited assistance Feeding assistance: Independent Dressing Assistance: Limited assistance     Functional Limitations Info  Sight, Hearing, Speech Sight Info: Impaired Hearing Info: Adequate Speech Info: Adequate    SPECIAL CARE FACTORS FREQUENCY  PT (By licensed PT), OT (By licensed OT)     PT Frequency: 5 times weekly OT Frequency: 5 times weekly            Contractures Contractures Info: Not present    Additional Factors Info  Code Status, Allergies Code Status Info: DNR-Intervene Allergies Info: Cat dander           Current Medications (11/23/2023):  This is the current hospital active medication list Current Facility-Administered Medications  Medication Dose Route Frequency Provider Last Rate Last Admin   0.9 %  sodium chloride  infusion   Intravenous Continuous Margrette Taft BRAVO, MD 100 mL/hr at 11/23/23 0404 New Bag at 11/23/23 0404   acetaminophen  (TYLENOL ) tablet 325-650 mg  325-650 mg Oral Q6H PRN Harrison, Stanley E, MD       docusate sodium  (COLACE) capsule 100 mg  100 mg Oral BID Margrette Taft BRAVO, MD   100 mg at 11/23/23 1127   enoxaparin  (LOVENOX ) injection 30 mg  30 mg Subcutaneous Q24H Margrette Taft BRAVO, MD   30 mg at 11/23/23 [id number]  feeding supplement (ENSURE PLUS HIGH PROTEIN) liquid 237 mL  237 mL Oral BID BM Harrison, Stanley E, MD       ferrous sulfate  tablet 325 mg  325 mg Oral Q breakfast Harrison, Stanley E, MD   325 mg at 11/23/23 1127   HYDROcodone -acetaminophen  (NORCO) 7.5-325 MG per tablet 1-2 tablet  1-2 tablet Oral Q4H PRN Harrison, Stanley E, MD       HYDROcodone -acetaminophen  (NORCO/VICODIN) 5-325 MG per tablet 1-2 tablet  1-2 tablet Oral Q4H PRN Harrison, Stanley E, MD   1 tablet  at 11/23/23 9162   levothyroxine  (SYNTHROID ) tablet 100 mcg  100 mcg Oral Daily Harrison, Stanley E, MD   100 mcg at 11/23/23 9375   magnesium  hydroxide (MILK OF MAGNESIA) suspension 30 mL  30 mL Oral Daily PRN Harrison, Stanley E, MD       menthol -cetylpyridinium (CEPACOL) lozenge 3 mg  1 lozenge Oral PRN Harrison, Stanley E, MD       Or   phenol (CHLORASEPTIC) mouth spray 1 spray  1 spray Mouth/Throat PRN Margrette Taft BRAVO, MD       methocarbamol  (ROBAXIN ) tablet 500 mg  500 mg Oral Q6H PRN Margrette Taft BRAVO, MD       Or   methocarbamol  (ROBAXIN ) injection 500 mg  500 mg Intravenous Q6H PRN Margrette Taft BRAVO, MD       metoCLOPramide  (REGLAN ) tablet 5-10 mg  5-10 mg Oral Q8H PRN Margrette Taft BRAVO, MD       Or   metoCLOPramide  (REGLAN ) injection 5-10 mg  5-10 mg Intravenous Q8H PRN Margrette Taft BRAVO, MD       [START ON 11/24/2023] metoprolol  succinate (TOPROL -XL) 24 hr tablet 25 mg  25 mg Oral Daily Shahmehdi, Seyed A, MD       morphine  (PF) 2 MG/ML injection 0.5-1 mg  0.5-1 mg Intravenous Q2H PRN Harrison, Stanley E, MD       multivitamin with minerals tablet 1 tablet  1 tablet Oral Daily Harrison, Stanley E, MD   1 tablet at 11/23/23 1127   ondansetron  (ZOFRAN ) tablet 4 mg  4 mg Oral Q6H PRN Margrette Taft BRAVO, MD       Or   ondansetron  (ZOFRAN ) injection 4 mg  4 mg Intravenous Q6H PRN Margrette Taft BRAVO, MD       traMADol  (ULTRAM ) tablet 50 mg  50 mg Oral Q6H Margrette Taft BRAVO, MD   50 mg at 11/23/23 1127   traZODone  (DESYREL ) tablet 25 mg  25 mg Oral QHS PRN Harrison, Stanley E, MD   25 mg at 11/22/23 2353     Discharge Medications: Please see discharge summary for a list of discharge medications.  Relevant Imaging Results:  Relevant Lab Results:   Additional Information SSN: [id number], LCSWA

## 2023-11-23 NOTE — Addendum Note (Signed)
 Addendum  created 11/23/23 0726 by Elaine Delon CROME, CRNA   Intraprocedure Meds edited

## 2023-11-23 NOTE — Plan of Care (Signed)
  Problem: Clinical Measurements: Goal: Ability to maintain clinical measurements within normal limits will improve Outcome: Progressing Goal: Will remain free from infection Outcome: Progressing   Problem: Activity: Goal: Risk for activity intolerance will decrease Outcome: Progressing   Problem: Coping: Goal: Level of anxiety will decrease Outcome: Progressing   Problem: Elimination: Goal: Will not experience complications related to urinary retention Outcome: Progressing   Problem: Pain Managment: Goal: General experience of comfort will improve and/or be controlled Outcome: Progressing   Problem: Safety: Goal: Ability to remain free from injury will improve Outcome: Progressing   Problem: Skin Integrity: Goal: Risk for impaired skin integrity will decrease Outcome: Progressing   Problem: Pain Management: Goal: Pain level will decrease Outcome: Progressing

## 2023-11-23 NOTE — Progress Notes (Signed)
 Pt has been sleeping all afternoon once back to bed from sitting in chair. Pt was complaining of posterior neck pain while up in recliner, warm pack applied to neck and pt with no further complaint of pain. Family concerned over pt's continued drowsiness, advised that pt has had both scheduled and PRN pain medications both last pm and this am, along with not sleeping all night last night after hip repair. Pt will wake up to name called, answer questions but immediately back to sleep. VS have been stable all shift. B/P meds held this am due to low b/p and HR (52). Pt now awake but grimacing, will not turn head or lower chin to chest. Grabs back of her neck with discomfort. Bilateral posterior shoulders tight to touch. Pt noted to have some coughing with attempting to take her scheduled oral pain med with water.  Pt with overall poor po intake today, only took 4 teaspoons of ice cream for supper (with coughing noted) and did not take anything for lunch today. Pt also has not voided since foley cath removed this am. Abd soft, non-distended to palpation but noted grimacing. Bladder scan performed x3 with each scan showing > urine in bladder. MD Shahmehdi notified of above (poor po intake and no void since foley out). Awaiting response.

## 2023-11-23 NOTE — Plan of Care (Signed)
  Problem: Acute Rehab PT Goals(only PT should resolve) Goal: Pt Will Go Supine/Side To Sit Outcome: Progressing Flowsheets (Taken 11/23/2023 1229) Pt will go Supine/Side to Sit: with moderate assist Goal: Patient Will Transfer Sit To/From Stand Outcome: Progressing Flowsheets (Taken 11/23/2023 1229) Patient will transfer sit to/from stand: with moderate assist Goal: Pt Will Transfer Bed To Chair/Chair To Bed Outcome: Progressing Flowsheets (Taken 11/23/2023 1229) Pt will Transfer Bed to Chair/Chair to Bed: with mod assist Goal: Pt Will Ambulate Outcome: Progressing Flowsheets (Taken 11/23/2023 1229) Pt will Ambulate:  with moderate assist  15 feet  25 feet  with rolling walker

## 2023-11-23 NOTE — TOC Initial Note (Addendum)
 Transition of Care [facility]) - Initial/Assessment Note    Patient Details  Name: Lauren Lawrence MRN: [id number] Date of Birth: October 21, 1940  [facility] of [facility]) CM/SW Contact:    Lucie Lunger, LCSWA Phone Number: 11/23/2023, 11:44 AM  Clinical Narrative:                 CSW notes PT recommending SNF for pt at discharge. CSW attempted to reach pts daughter, secure VM left requesting call back when able. CSW will review SNF recommendation once return call is made. CSW to await return call prior to sending out SNF referral. TOC to follow.   Addendum 1:15pm: CSW spoke with pts daughter to review SNF recommendation. She is agreeable to SNF placement and requested referral be sent to [facility] and [facility]. CSW to complete referral and send out for review. TOC to follow,   Expected Discharge Plan: Skilled Nursing Facility Barriers to Discharge: Continued Medical Work up   Patient Goals and CMS Choice Patient states their goals for this hospitalization and ongoing recovery are:: get stronger CMS Medicare.gov Compare Post Acute Care list provided to:: Patient Represenative (must comment) Choice offered to / list presented to : Adult Children      Expected Discharge Plan and Services In-house Referral: Clinical Social Work Discharge Planning Services: CM Consult Post Acute Care Choice: Skilled Nursing Facility Living arrangements for the past 2 months: Single Family Home                                      Prior Living Arrangements/Services Living arrangements for the past 2 months: Single Family Home Lives with:: Self Patient language and need for interpreter reviewed:: Yes Do you feel safe going back to the place where you live?: Yes      Need for Family Participation in Patient Care: Yes (Comment) Care giver support system in place?: Yes (comment)   Criminal Activity/Legal Involvement Pertinent to Current Situation/Hospitalization: No - Comment as needed  Activities  of Daily Living   ADL Screening (condition at time of admission) Independently performs ADLs?: No Does the patient have a NEW difficulty with bathing/dressing/toileting/self-feeding that is expected to last >3 days?: No Does the patient have a NEW difficulty with getting in/out of bed, walking, or climbing stairs that is expected to last >3 days?: Yes (Initiates electronic notice to provider for possible PT consult) Does the patient have a NEW difficulty with communication that is expected to last >3 days?: No Is the patient deaf or have difficulty hearing?: No Does the patient have difficulty seeing, even when wearing glasses/contacts?: No Does the patient have difficulty concentrating, remembering, or making decisions?: Yes (h/o dementia)  Permission Sought/Granted                  Emotional Assessment Appearance:: Appears stated age       Alcohol / Substance Use: Not Applicable Psych Involvement: No (comment)  Admission diagnosis:  Closed fracture of right hip, initial encounter (HCC) [S72.001A] Closed right hip fracture, initial encounter (HCC) [S72.001A] Patient Active Problem List   Diagnosis Date Noted   Protein-calorie malnutrition, severe 11/22/2023   Displaced intertrochanteric fracture of right femur, initial encounter for closed fracture (HCC) 11/21/2023   Hypothyroidism 11/21/2023   Essential hypertension 11/21/2023   Elevated LFTs 11/21/2023   Hyponatremia 11/21/2023   Chronic back pain 11/21/2023   Displaced fracture of left femoral neck (HCC) 10/14/2022  Hypertensive urgency 10/14/2022   Dementia without behavioral disturbance (HCC) 10/14/2022   Iron  deficiency anemia 05/14/2022   Maculopapular rash, localized 07/03/2021   Memory loss 05/11/2021   Thrombocytopenia (HCC) 07/10/2019   Skin trauma 07/10/2019   Allergic rhinitis 10/17/2014   Cyst of thyroid  10/17/2014   Clinical depression 10/17/2014   Essential (primary) hypertension 10/17/2014   Fatty  infiltration of liver 10/17/2014   Acquired hypothyroidism 10/17/2014   Leukopenia 10/17/2014   OP (osteoporosis) 10/17/2014   Enlargement of spleen 10/17/2014   Phlebectasia 10/17/2014   Avitaminosis D 10/17/2014   HLD (hyperlipidemia) 07/25/2014   PCP:  [name], MD Pharmacy:   [facility] [address], [place] - [address] [place] [address] Phone: [phone] Fax: [phone]     Social Drivers of Health (SDOH) Social History: SDOH Screenings   Food Insecurity: No Food Insecurity (11/21/2023)  Housing: Unknown (11/21/2023)  Transportation Needs: No Transportation Needs (11/21/2023)  Utilities: Patient Declined (11/21/2023)  Alcohol Screen: Low Risk  (05/14/2022)  Depression (PHQ2-9): Medium Risk (09/02/2023)  Financial Resource Strain: Low Risk  (07/05/2022)  Physical Activity: Insufficiently Active (07/05/2022)  Social Connections: Patient Declined (11/21/2023)  Stress: No Stress Concern Present (07/05/2022)  Tobacco Use: Low Risk  (11/22/2023)   SDOH Interventions:     Readmission Risk Interventions    11/21/2023   10:14 AM  Readmission Risk Prevention Plan  Post Dischage Appt Complete  Medication Screening Complete  Transportation Screening Complete

## 2023-11-23 NOTE — Progress Notes (Signed)
 Progress Note   Patient: Lauren Lawrence Mast FMW:[id number] DOB: [birth year] DOA: 11/20/2023     2 DOS: the patient was seen and examined on 11/23/2023   Brief hospital admission narrative: As per H&P written by Dr. Lawence on a [address] Lauren Lawrence is a 83 y.o. Caucasian female with medical history significant for hypertension, dyslipidemia and depression, who presented to the emergency room with acute onset of fall.  The patient was getting undressed to get ready for bed when she lost her balance and landed on her right hip with subsequent acute pain.  She denies any head injury.  No paresthesias or focal muscle weakness.  No presyncope or syncope.  No chest pain or palpitations.  No fever or chills.  No cough or wheezing or dyspnea.  No bleeding diathesis.  She is status post left hip hemiarthroplasty for previous history of left hip fracture.  The patient had another fall in May per her daughter and has been having chronic back pain since then.   ED Course: Upon presentation to the emergency room, BP was 167/82 with otherwise normal vital signs.  Labs revealed a sodium of 132 and blood glucose of 185 with albumin  of 3.1.  AST was 73 and ALT 48 with total protein 5.7 and total bili 2.2. EKG as reviewed by me : EKG showed normal sinus rhythm with a rate of 65 with poor R wave progression and Q waves inferiorly Imaging: 1. Chronic appearing increased interstitial lung markings without acute or active cardiopulmonary disease. 2. L1 vertebral body compression fracture of indeterminate age. Correlation with physical examination is recommended to determine the presence of point tenderness.   Subjective:   The patient was seen and examined this morning postop day #1, status post ORIF right hip yesterday Tolerated procedure well -Under narcotic effect, slow with speech, comprehension intact, mildly hypertensive otherwise hemodynamically stable   Assessment and plan: She is a chest no problem admitted,  Closed  right hip fracture, initial encounter (HCC) -Postop day #1 - S/p ORIF right hip 11/23/2023 - Tolerated procedure well, BP mildly soft this a.m. otherwise stable - Continue as needed analgesics - Continue supportive care, follow electrolytes and hemoglobin trend.   Hypothyroidism -Continue Synthroid .  Essential hypertension -BP running soft, holding Cozaar , reducing metoprolol  dose to 25 mg daily   Elevated LFTs -In the setting of a stress demargination most likely - Continue maintaining adequate hydration - Follow LFTs.  Hyponatremia -In the setting of mild dehydration -improving - Gentle fluid has been provided - Will follow electrolytes trend.  Chronic back pain - This is likely secondary to her old L1 vertebral compression fracture. -Continue as needed analgesics.   Physical Exam: Blood pressure (!) 104/42, pulse (!) 52, temperature 97.6 F (36.4 C), temperature source Oral, resp. rate 16, height 5' 4 (1.626 m), weight 43.2 kg, SpO2 96%.    General:  AAO x 3,  cooperative, no distress;   HEENT:  Normocephalic, PERRL, otherwise with in Normal limits   Neuro:  CNII-XII intact. , normal motor and sensation, reflexes intact   Lungs:   Clear to auscultation BL, Respirations unlabored,  No wheezes / crackles  Cardio:    S1/S2, RRR, No murmure, No Rubs or Gallops   Abdomen:  Soft, non-tender, bowel sounds active all four quadrants, no guarding or peritoneal signs.  Muscular  skeletal:  Limited exam -global generalized weaknesses - in bed, able to move all 4 extremities,   2+ pulses,  symmetric,  Right hip postsurgical  dressing in place, mild tenderness, and edema negative for any erythema or drainage  Skin:  Dry, warm to touch, right hip surgical wound  Wounds: Right hip surgical wound-dressing in place           Data Reviewed: CBC: White blood cells 8.8, hemoglobin 11.5 and platelet count 145K Basic metabolic panel: Sodium 133, potassium 4.2, chloride 101,  bicarb 24, BUN 9, creatinine 0.49 and GFR >60  Family Communication: Daughter is at bedside.  Disposition: Status is: Inpatient Remains inpatient appropriate because: Continue current plan of care for surgical repair of hip fracture.  Most likely discharge to skilled nursing facility for short-term rehabilitation after repair.  Follow assessment and recommendations by PT/OT after surgery.  Time spent: 50 minutes  Author: Adriana DELENA Grams, MD 11/23/2023 11:51 AM  For on call review [email or web address].

## 2023-11-24 DIAGNOSIS — S72141A Displaced intertrochanteric fracture of right femur, initial encounter for closed fracture: Secondary | ICD-10-CM | POA: Diagnosis not present

## 2023-11-24 LAB — BASIC METABOLIC PANEL WITH GFR
Anion gap: 5 (ref 5–15)
BUN: 14 mg/dL (ref 8–23)
CO2: 19 mmol/L — ABNORMAL LOW (ref 22–32)
Calcium: 7.6 mg/dL — ABNORMAL LOW (ref 8.9–10.3)
Chloride: 105 mmol/L (ref 98–111)
Creatinine, Ser: 0.57 mg/dL (ref 0.44–1.00)
GFR, Estimated: >60 mL/min (ref >60)
Glucose, Bld: 105 mg/dL — ABNORMAL HIGH (ref 70–99)
Potassium: 3.7 mmol/L (ref 3.5–5.1)
Sodium: 129 mmol/L — ABNORMAL LOW (ref 135–145)

## 2023-11-24 LAB — CBC
HCT: 27 % — ABNORMAL LOW (ref 36.0–46.0)
Hemoglobin: 9.1 g/dL — ABNORMAL LOW (ref 12.0–15.0)
MCH: 34.5 pg — ABNORMAL HIGH (ref 26.0–34.0)
MCHC: 33.7 g/dL (ref 30.0–36.0)
MCV: 102.3 fL — ABNORMAL HIGH (ref 80.0–100.0)
Platelets: 92 K/uL — ABNORMAL LOW (ref 150–400)
RBC: 2.64 MIL/uL — ABNORMAL LOW (ref 3.87–5.11)
RDW: 14.6 % (ref 11.5–15.5)
WBC: 4.8 K/uL (ref 4.0–10.5)
nRBC: 0 % (ref 0.0–0.2)

## 2023-11-24 MED ORDER — ORAL CARE MOUTH RINSE: 15.0000 mL | OROMUCOSAL | Status: DC | PRN

## 2023-11-24 MED ORDER — SODIUM CHLORIDE 0.9 % IV SOLN: INTRAVENOUS | Status: DC

## 2023-11-24 MED ORDER — TAMSULOSIN HCL 0.4 MG PO CAPS
0.4000 mg | ORAL_CAPSULE | Freq: Every day | ORAL | Status: DC
  Administered 2023-11-24 – 2023-11-28 (×6): 0.4 mg via ORAL
  Filled 2023-11-24 (×5): qty 1

## 2023-11-24 MED ORDER — ACETAMINOPHEN 500 MG PO TABS
1000.0000 mg | ORAL_TABLET | Freq: Three times a day (TID) | ORAL | Status: DC
  Administered 2023-11-24 – 2023-11-25 (×4): 1000 mg via ORAL
  Filled 2023-11-24 (×6): qty 2

## 2023-11-24 MED ORDER — CHLORHEXIDINE GLUCONATE CLOTH 2 % EX PADS
6.0000 | MEDICATED_PAD | Freq: Every day | CUTANEOUS | Status: DC
  Administered 2023-11-25 – 2023-11-28 (×4): 6 via TOPICAL

## 2023-11-24 NOTE — Progress Notes (Signed)
 Pt unable to express if she feels the urge to urinate or express a bm. Performed a bladder scan, pt currently retaining 394 mls of fluid. MD notified. New orders for Foley insertion.

## 2023-11-24 NOTE — Progress Notes (Signed)
 Progress Note   Patient: Lauren Lawrence FMW:[id number] DOB: 02-13-1941 DOA: 11/20/2023     3 DOS: the patient was seen and examined on 11/24/2023   Brief hospital admission narrative: As per H&P written by Dr. Lawence on a [name] is a 83 y.o. Caucasian female with medical history significant for hypertension, dyslipidemia and depression, who presented to the emergency room with acute onset of fall.  The patient was getting undressed to get ready for bed when she lost her balance and landed on her right hip with subsequent acute pain.  She denies any head injury.  No paresthesias or focal muscle weakness.  No presyncope or syncope.  No chest pain or palpitations.  No fever or chills.  No cough or wheezing or dyspnea.  No bleeding diathesis.  She is status post left hip hemiarthroplasty for previous history of left hip fracture.  The patient had another fall in May per her daughter and has been having chronic back pain since then.   ED Course: Upon presentation to the emergency room, BP was 167/82 with otherwise normal vital signs.  Labs revealed a sodium of 132 and blood glucose of 185 with albumin  of 3.1.  AST was 73 and ALT 48 with total protein 5.7 and total bili 2.2. EKG as reviewed by me : EKG showed normal sinus rhythm with a rate of 65 with poor R wave progression and Q waves inferiorly Imaging: 1. Chronic appearing increased interstitial lung markings without acute or active cardiopulmonary disease. 2. L1 vertebral body compression fracture of indeterminate age. Correlation with physical examination is recommended to determine the presence of point tenderness.   Subjective:   The patient was seen and examined this morning postop day #1, status post ORIF right hip yesterday Tolerated procedure well -Under narcotic effect, slow with speech, comprehension intact, mildly hypertensive otherwise hemodynamically stable   Assessment and plan: She is a chest no problem admitted,  Closed  right hip fracture, initial encounter (HCC) -Postop day #2 - S/p ORIF right hip 11/22/2023 - Hemodynamically stable - Continue as needed analgesics - Continue supportive care, follow electrolytes and hemoglobin trend.  Hyponatremia Acute on chronic hyponatremia in the setting of poor oral intake, mild dehydration - Encouraging p.o. intake, gentle IV fluid hydration - Gentle fluid has been provided - Will follow electrolytes trend.  Urinary retention  After multiple attempts of In-N-Out, Foley catheter was placed 11/23/23  - Will monitor closely, initiating Flomax  - Plan to discontinue Foley catheter in next 24-48 hours voiding trial before discharge to SNF  Dementia  Dementia with chronic cognitive debility -Per daughter patient has been taken off medication few years ago due to side effects - Monitoring for delirium exacerbated by dementia, sundowning As needed Haldol   hypothyroidism  -Continue Synthroid .  Essential hypertension -BP running soft, holding Cozaar , reducing metoprolol  dose to 25 mg daily   Elevated LFTs -In the setting of a stress demargination most likely - Continue maintaining adequate hydration - Follow LFTs.    chronic back pain - This is likely secondary to her old L1 vertebral compression fracture. -Continue as needed analgesics.     ------------------------------------------------------------------------------------------------------------Physical Exam: Blood pressure (!) 149/51, pulse 70, temperature 98 F (36.7 C), temperature source Oral, resp. rate 19, height 5' 4 (1.626 m), weight 43.2 kg, SpO2 100%.      General:  AAO x 1,  cooperative, no distress; pleasantly confused  HEENT:  Normocephalic, PERRL, otherwise with in Normal limits   Neuro:  Limited exam patient is confused at baseline  CNII-XII intact. , normal motor and sensation, reflexes intact   Lungs:   Clear to auscultation BL, Respirations unlabored,  No wheezes / crackles   Cardio:    S1/S2, RRR, No murmure, No Rubs or Gallops   Abdomen:  Soft, non-tender, bowel sounds active all four quadrants, no guarding or peritoneal signs.  Muscular  skeletal:  Limited range of motion of right hip, dressing in place, Limited exam -global generalized weaknesses - in bed, able to move all 4 extremities,   2+ pulses,  symmetric, No pitting edema  Skin:  Dry, warm to touch, right hip surgical wound  Foley catheter in place  wounds: Right hip surgical wound clean, there is erythema or drainage         Data Reviewed:    Latest Ref Rng & Units 11/24/2023    4:59 AM 11/23/2023    7:00 AM 11/22/2023    5:53 PM  CBC  WBC 4.0 - 10.5 K/uL 4.8  7.4  8.1   Hemoglobin 12.0 - 15.0 g/dL 9.1  89.6  88.5   Hematocrit 36.0 - 46.0 % 27.0  29.4  32.6   Platelets 150 - 400 K/uL 92  105  116       Latest Ref Rng & Units 11/24/2023    4:59 AM 11/23/2023    5:15 AM 11/22/2023    5:53 PM  CMP  Glucose 70 - 99 mg/dL 894  81    BUN 8 - 23 mg/dL 14  15    Creatinine 9.55 - 1.00 mg/dL 9.42  9.46  9.52   Sodium 135 - 145 mmol/L 129  131    Potassium 3.5 - 5.1 mmol/L 3.7  3.7    Chloride 98 - 111 mmol/L 105  104    CO2 22 - 32 mmol/L 19  18    Calcium 8.9 - 10.3 mg/dL 7.6  7.9       Family Communication: Daughter is at bedside.  Disposition: Status is: Inpatient Remains inpatient appropriate because: Continue current plan of care for surgical repair of hip fracture.  Disposition:  most likely discharge to skilled nursing facility for short-term rehabilitation after repair.   Follow assessment and recommendations by PT/OT after surgery. TOC assisting for placement  Family-daughter requesting for patient not to be discharged with Foley catheter.  Family has special requests for place at [facility]  Time spent: 50 minutes  Author: Adriana DELENA Grams, MD 11/24/2023 1:18 PM  For on call review [email or web address].

## 2023-11-24 NOTE — TOC Progression Note (Signed)
 Transition of Care [facility]) - Progression Note    Patient Details  Name: Lauren Lawrence MRN: [id number] Date of Birth: [birth year]  [facility] of [facility] CM/SW Contact  Lucie Lunger, [place] Phone Number: 11/24/2023, 12:54 PM  Clinical Narrative:    CSW updated that pts family prefers SNF at [facility]. CSW spoke to admissions who states they will have a bed for pt on Monday. CSW updated MD and family of this. Bed choice added to insurance auth. TOC to follow.   Expected Discharge Plan: Skilled Nursing Facility Barriers to Discharge: Continued Medical Work up               Expected Discharge Plan and Services In-house Referral: Clinical Social Work Discharge Planning Services: CM Consult Post Acute Care Choice: Skilled Nursing Facility Living arrangements for the past 2 months: Single Family Home                                       Social Drivers of Health (SDOH) Interventions SDOH Screenings   Food Insecurity: No Food Insecurity (11/21/2023)  Housing: Unknown (11/21/2023)  Transportation Needs: No Transportation Needs (11/21/2023)  Utilities: Patient Declined (11/21/2023)  Alcohol Screen: Low Risk  (05/14/2022)  Depression (PHQ2-9): Medium Risk (09/02/2023)  Financial Resource Strain: Low Risk  (07/05/2022)  Physical Activity: Insufficiently Active (07/05/2022)  Social Connections: Patient Declined (11/21/2023)  Stress: No Stress Concern Present (07/05/2022)  Tobacco Use: Low Risk  (11/22/2023)    Readmission Risk Interventions    11/21/2023   10:14 AM  Readmission Risk Prevention Plan  Post Dischage Appt Complete  Medication Screening Complete  Transportation Screening Complete

## 2023-11-24 NOTE — Plan of Care (Signed)

## 2023-11-24 NOTE — Progress Notes (Signed)
 Foley catheter inserted for urinary retention per order. Pt tolerated well, 500 ml of clear yellow urine returned. Pt assisted up to sitting position on side of bed, tends to lean back requiring support by staff to stay upright. Pt requires bracing of feet to prevent sliding on initial standing and then multiple queuing for coordinated movement of feet to take 5 steps from bed to chair. Reticent to bear much weight on right foot but tolerated well. PT staff in to work in with pt at this time. Chair alarm on for safety, daughters at bedside.-

## 2023-11-24 NOTE — Plan of Care (Signed)
  Problem: Pain Managment: Goal: General experience of comfort will improve and/or be controlled Outcome: Progressing   Problem: Safety: Goal: Ability to remain free from injury will improve Outcome: Progressing   Problem: Clinical Measurements: Goal: Postoperative complications will be avoided or minimized Outcome: Progressing

## 2023-11-24 NOTE — Progress Notes (Signed)
 Bladder Scan: 397  MD approved I/O  I/O output: 400 mls  Pt tolerated well. Daughter at bedside. MD notified

## 2023-11-24 NOTE — Progress Notes (Signed)
 Physical Therapy Treatment Patient Details Name: Lauren Lawrence MRN: [id number] DOB: [birth year] Today's Date: 11/24/2023   History of Present Illness Lauren Lawrence is a 83 y.o. Caucasian female with medical history significant for hypertension, dyslipidemia and depression, who presented to the emergency room with acute onset of fall.  The patient was getting undressed to get ready for bed when she lost her balance and landed on her right hip with subsequent acute pain.  She denies any head injury.  No paresthesias or focal muscle weakness.  No presyncope or syncope.  No chest pain or palpitations.  No fever or chills.  No cough or wheezing or dyspnea.  No bleeding diathesis.  She is status post left hip hemiarthroplasty for previous history of left hip fracture.  The patient had another fall in May per her daughter and has been having chronic back pain since then. Patient s/p FIXATION, FRACTURE, INTERTROCHANTERIC, WITH INTRAMEDULLARY ROD (Right) on 11/22/23.    PT Comments  Pt sitting in chair upon therapist's entrance and willing to participate with therapy today.  2 daughter's present in room that assisted with communication.  Pt with mod/max A required with multimodal cueing for safe mechanics with standing from chair.  Pt limited by pain with weight bearing and limited by weakness and fatigue with activity.  Verbal and tactile cueing to improve weight shifting and advance LE.  EOS pt left in chair with call bell within reach and daughter's present.  No reoprts of hip pain in seated position.    If plan is discharge home, recommend the following:     Can travel by private vehicle        Equipment Recommendations       Recommendations for Other Services       Precautions / Restrictions       Mobility  Bed Mobility               General bed mobility comments: Pt in chair upon therapist's entrance    Transfers Overall transfer level: Needs assistance   Transfers: Sit to/from  Stand Sit to Stand: Mod assist, Max assist           General transfer comment: increased time, labored movement; difficulty following commands; verbal and tactile cues for use of RW    Ambulation/Gait Ambulation/Gait assistance: Max assist Gait Distance (Feet): 3 Feet Assistive device: Rolling walker (2 wheels) Gait Pattern/deviations: Decreased step length - right, Decreased step length - left, Decreased stance time - right, Decreased stride length Gait velocity: decreased     General Gait Details: limited to side steps at bedside due to in pain and difficulty following commands; frequent verbal and tactile cues for advancing BIL LEs   Stairs             Wheelchair Mobility     Tilt Bed    Modified Rankin (Stroke Patients Only)       Balance                                            Communication Communication Communication: Impaired Factors Affecting Communication: Difficulty expressing self  Cognition Arousal: Lethargic Behavior During Therapy: Flat affect   PT - Cognitive impairments: History of cognitive impairments                       PT - Cognition  Comments: daughters present to provide additional information Following commands: Impaired Following commands impaired: Follows one step commands inconsistently    Cueing Cueing Techniques: Verbal cues, Tactile cues, Visual cues  Exercises Total Joint Exercises Ankle Circles/Pumps: AROM, 5 reps, Seated Long Arc Quad: AROM, Both, 5 reps, Seated Marching in Standing: AROM, Both, 5 reps, Seated    General Comments        Pertinent Vitals/Pain Pain Assessment Pain Assessment: 0-10 Faces Pain Scale: Hurts even more (during weight bearing) Pain Location: Right hip/thigh Pain Descriptors / Indicators: Grimacing, Guarding Pain Intervention(s): Monitored during session, Limited activity within patient's tolerance, Repositioned    Home Living                           Prior Function            PT Goals (current goals can now be found in the care plan section)      Frequency           PT Plan      Co-evaluation              AM-PAC PT 6 Clicks Mobility   Outcome Measure  Help needed turning from your back to your side while in a flat bed without using bedrails?: A Lot Help needed moving from lying on your back to sitting on the side of a flat bed without using bedrails?: A Lot Help needed moving to and from a bed to a chair (including a wheelchair)?: A Lot Help needed standing up from a chair using your arms (e.g., wheelchair or bedside chair)?: A Lot Help needed to walk in hospital room?: A Lot Help needed climbing 3-5 steps with a railing? : A Lot 6 Click Score: 12    End of Session Equipment Utilized During Treatment: Gait belt Activity Tolerance: Patient limited by pain Patient left: in chair;with family/visitor present;with call bell/phone within reach;with chair alarm set Nurse Communication: Mobility status PT Visit Diagnosis: Unsteadiness on feet (R26.81);Muscle weakness (generalized) (M62.81);Other abnormalities of gait and mobility (R26.89)     Time: 9179-9154 PT Time Calculation (min) (ACUTE ONLY): 25 min  Charges:    $Therapeutic Activity: 23-37 mins PT General Charges $$ ACUTE PT VISIT: 1 Visit                     Augustin Mclean, LPTA/CLT; CBIS [phone]  Mclean Augustin Amble 11/24/2023, 10:45 AM

## 2023-11-25 DIAGNOSIS — S72141A Displaced intertrochanteric fracture of right femur, initial encounter for closed fracture: Secondary | ICD-10-CM | POA: Diagnosis not present

## 2023-11-25 LAB — COMPREHENSIVE METABOLIC PANEL WITH GFR
ALT: 20 U/L (ref 0–44)
AST: 46 U/L — ABNORMAL HIGH (ref 15–41)
Albumin: 2.1 g/dL — ABNORMAL LOW (ref 3.5–5.0)
Alkaline Phosphatase: 56 U/L (ref 38–126)
Anion gap: 8 (ref 5–15)
BUN: 17 mg/dL (ref 8–23)
CO2: 20 mmol/L — ABNORMAL LOW (ref 22–32)
Calcium: 7.9 mg/dL — ABNORMAL LOW (ref 8.9–10.3)
Chloride: 104 mmol/L (ref 98–111)
Creatinine, Ser: 0.55 mg/dL (ref 0.44–1.00)
GFR, Estimated: >60 mL/min (ref >60)
Glucose, Bld: 139 mg/dL — ABNORMAL HIGH (ref 70–99)
Potassium: 4 mmol/L (ref 3.5–5.1)
Sodium: 132 mmol/L — ABNORMAL LOW (ref 135–145)
Total Bilirubin: 1.4 mg/dL — ABNORMAL HIGH (ref 0.0–1.2)
Total Protein: 4.2 g/dL — ABNORMAL LOW (ref 6.5–8.1)

## 2023-11-25 LAB — PREALBUMIN: Prealbumin: <5 mg/dL — ABNORMAL LOW (ref 18–38)

## 2023-11-25 MED ORDER — SODIUM CHLORIDE 0.9 % IV SOLN: INTRAVENOUS | Status: DC

## 2023-11-25 MED ORDER — MEGESTROL ACETATE 400 MG/10ML PO SUSP
400.0000 mg | Freq: Every day | ORAL | Status: DC
  Administered 2023-11-25 – 2023-11-28 (×5): 400 mg via ORAL
  Filled 2023-11-25 (×4): qty 10

## 2023-11-25 MED ORDER — FUROSEMIDE 10 MG/ML IJ SOLN
20.0000 mg | Freq: Two times a day (BID) | INTRAMUSCULAR | Status: AC
  Administered 2023-11-25 – 2023-11-26 (×2): 20 mg via INTRAVENOUS
  Filled 2023-11-25 (×2): qty 2

## 2023-11-25 MED ORDER — ALBUMIN HUMAN 25 % IV SOLN
25.0000 g | Freq: Once | INTRAVENOUS | Status: AC
  Administered 2023-11-25: 25 g via INTRAVENOUS
  Filled 2023-11-25: qty 100

## 2023-11-25 NOTE — Progress Notes (Signed)
 Mobility Specialist Progress Note:    11/25/23 1300  Mobility  Activity Pivoted/transferred from chair to bed  Level of Assistance Maximum assist, patient does 25-49% (+2)  Assistive Device None  Distance Ambulated (ft) 3 ft  Range of Motion/Exercises Active;All extremities  RLE Weight Bearing Per Provider Order WBAT  Activity Response Tolerated well  Mobility Referral Yes  Mobility visit 1 Mobility  Mobility Specialist Start Time (ACUTE ONLY) 1300  Mobility Specialist Stop Time (ACUTE ONLY) 1315  Mobility Specialist Time Calculation (min) (ACUTE ONLY) 15 min   Pt received in chair, requesting asssitance to bed. Required MaxA +2 to stand and pivot with no AD. Tolerated well, NT in room. All needs met.  Lauren Lawrence Mobility Specialist Please contact via Special educational needs teacher or  Rehab office at [phone]

## 2023-11-25 NOTE — Progress Notes (Signed)
 Pts daughter concerned pt will not be able to produce urine if the foley cath is removed for bladder trial before DC to SNF. Pts output was 150 mls from night shift-this morning. SNF will send pt back to the hospital. This pt has not had a BM. [place] was given.

## 2023-11-25 NOTE — Plan of Care (Signed)
  Problem: Clinical Measurements: Goal: Will remain free from infection Outcome: Progressing   Problem: Activity: Goal: Risk for activity intolerance will decrease Outcome: Progressing   Problem: Coping: Goal: Level of anxiety will decrease Outcome: Progressing   Problem: Pain Managment: Goal: General experience of comfort will improve and/or be controlled Outcome: Progressing

## 2023-11-25 NOTE — Progress Notes (Signed)
 Progress Note   Patient: Lauren Lawrence FMW:[id number] DOB: [birth year] DOA: 11/20/2023     4 DOS: the patient was seen and examined on 11/25/2023   Brief hospital admission narrative: As per H&P written by Dr. Lawence on a [name] is a 83 y.o. Caucasian female with medical history significant for hypertension, dyslipidemia and depression, who presented to the emergency room with acute onset of fall.  The patient was getting undressed to get ready for bed when she lost her balance and landed on her right hip with subsequent acute pain.  She denies any head injury.  No paresthesias or focal muscle weakness.  No presyncope or syncope.  No chest pain or palpitations.  No fever or chills.  No cough or wheezing or dyspnea.  No bleeding diathesis.  She is status post left hip hemiarthroplasty for previous history of left hip fracture.  The patient had another fall in May per her daughter and has been having chronic back pain since then.   ED Course: Upon presentation to the emergency room, BP was 167/82 with otherwise normal vital signs.  Labs revealed a sodium of 132 and blood glucose of 185 with albumin  of 3.1.  AST was 73 and ALT 48 with total protein 5.7 and total bili 2.2. EKG as reviewed by me : EKG showed normal sinus rhythm with a rate of 65 with poor R wave progression and Q waves inferiorly Imaging: 1. Chronic appearing increased interstitial lung markings without acute or active cardiopulmonary disease. 2. L1 vertebral body compression fracture of indeterminate age. Correlation with physical examination is recommended to determine the presence of point tenderness.   Subjective:   The patient was seen in examined, stable mild pitting edema bilateral upper extremities denies any shortness of breath or chest pain Foley catheter placed minimal urine output  Daughters at bedside concerned about her urine output and Foley catheter    Assessment and plan: She is a chest no problem  admitted,  Closed right hip fracture, initial encounter (HCC) -Postop day #3 - S/p ORIF right hip 11/22/2023 - Hemodynamically stable - Continue as needed analgesics - Continue supportive care, follow electrolytes and hemoglobin trend.  Hyponatremia Acute on chronic hyponatremia in the setting of poor oral intake, mild dehydration - Poor p.o. intake - encouraging p.o. intake, gentle IV fluid hydration - Gentle fluid has been provided - Will follow electrolytes trend.   Urinary retention  After multiple attempts of In-N-Out, Foley catheter was placed 11/23/23  - Will monitor closely, initiating Flomax  - Plan to discontinue Foley catheter in next 24-48 hours voiding trial before discharge to SNF - Family concerned about urine retention, Foley catheter, and minimal urine output - Will initiate strict I's and O's, - Patient remained hemodynamically stable normal BUN/creatinine  Poor appetite, malnutrition Body mass index is 16.35 kg/m. Initiating appetite stimulant Megace  -Encouraging oral intake  Hypoalbuminemia -Replace albumin   Dementia  Advance dementia with chronic cognitive debility -Per daughter patient has been taken off medication few years ago due to side effects - Monitoring for delirium exacerbated by dementia, sundowning As needed Haldol   hypothyroidism  -Continue Synthroid .  Essential hypertension -BP running soft, holding Cozaar , reducing metoprolol  dose to 25 mg daily   Elevated LFTs -In the setting of a stress demargination most likely - Continue maintaining adequate hydration - Follow LFTs.    chronic back pain - This is likely secondary to her old L1 vertebral compression fracture. -Continue as needed analgesics.     ------------------------------------------------------------------------------------------------------------  Phy physical exam: Blood pressure (!) 119/50, pulse 78, temperature 98.2 F (36.8 C), temperature source Oral, resp. rate  16, height 5' 4 (1.626 m), weight 43.2 kg, SpO2 95%.  General:  AAO x 1,  cooperative, no distress;  Generalized cachexia  HEENT:  Normocephalic, PERRL, otherwise with in Normal limits   Neuro:  CNII-XII intact. , normal motor and sensation, reflexes intact   Lungs:   Clear to auscultation BL, Respirations unlabored,  No wheezes / crackles  Cardio:    S1/S2, RRR, No murmure, No Rubs or Gallops   Abdomen:  Soft, non-tender, bowel sounds active all four quadrants, no guarding or peritoneal signs.  Muscular  skeletal:  Diffuse muscle wasting, cachectic Right hip limited range of motion, lateral right hip surgical wound clean, dressing in place positive pulses-negative drainage Limited exam -global generalized weaknesses - in bed, able to move all 4 extremities,   2+ pulses,  symmetric, No pitting edema  Skin:  Dry, warm to touch, negative for any Rashes,  Wounds: Please see nursing documentation            Data Reviewed:    Latest Ref Rng & Units 11/24/2023    4:59 AM 11/23/2023    7:00 AM 11/22/2023    5:53 PM  CBC  WBC 4.0 - 10.5 K/uL 4.8  7.4  8.1   Hemoglobin 12.0 - 15.0 g/dL 9.1  89.6  88.5   Hematocrit 36.0 - 46.0 % 27.0  29.4  32.6   Platelets 150 - 400 K/uL 92  105  116       Latest Ref Rng & Units 11/24/2023    4:59 AM 11/23/2023    5:15 AM 11/22/2023    5:53 PM  CMP  Glucose 70 - 99 mg/dL 894  81    BUN 8 - 23 mg/dL 14  15    Creatinine 9.55 - 1.00 mg/dL 9.42  9.46  9.52   Sodium 135 - 145 mmol/L 129  131    Potassium 3.5 - 5.1 mmol/L 3.7  3.7    Chloride 98 - 111 mmol/L 105  104    CO2 22 - 32 mmol/L 19  18    Calcium 8.9 - 10.3 mg/dL 7.6  7.9       Family Communication: Daughter is at bedside.  Disposition: Status is: Inpatient Remains inpatient appropriate because: Continue current plan of care for surgical repair of hip fracture.  Disposition:  most likely discharge to skilled nursing facility for short-term rehabilitation after repair.   Follow  assessment and recommendations by PT/OT after surgery. TOC assisting for placement  Family-daughter requesting for patient not to be discharged with Foley catheter.  Family has special requests for place at [facility]  Time spent: 50 minutes  Author: Adriana DELENA Grams, MD 11/25/2023 12:06 PM  For on call review [email or web address].

## 2023-11-25 NOTE — Progress Notes (Signed)
 Physical Therapy Treatment Patient Details Name: Lauren Lawrence MRN: [id number] DOB: July 08, 1940 Today's Date: 11/25/2023   History of Present Illness Lauren Lawrence is a 83 y.o. Caucasian female with medical history significant for hypertension, dyslipidemia and depression, who presented to the emergency room with acute onset of fall.  The patient was getting undressed to get ready for bed when she lost her balance and landed on her right hip with subsequent acute pain.  She denies any head injury.  No paresthesias or focal muscle weakness.  No presyncope or syncope.  No chest pain or palpitations.  No fever or chills.  No cough or wheezing or dyspnea.  No bleeding diathesis.  She is status post left hip hemiarthroplasty for previous history of left hip fracture.  The patient had another fall in May per her daughter and has been having chronic back pain since then. Patient s/p FIXATION, FRACTURE, INTERTROCHANTERIC, WITH INTRAMEDULLARY ROD (Right) on 11/22/23.    PT Comments  Patient limited this PT session due to increased pain. Patient was received supine in bed. One daughter present throughout session. On this date, patient required max assist for bed mobility as well as functional transfers and very limited steps at bedside. Patient cont to require at least CGA during LE exercises seated at EOB due to poor seated balance. Active assist given for RLE due to weakness and pain.  LE exercises limited as pt appears to be in increased pain as she groins in pain. Inc time and max assist needed for bed>chair transfer. PT providing weight shifting from R/LLE for pt to advance feet and assisting with Rw management. Consistent and step by step verbal cueing needed throughout. Patient tolerated sitting in chair at end of session.  Call button within reach and daughter present. Nurse also notified. Patient will benefit from continued skilled physical therapy acutely and in recommended venue in order to address her remaining  deficits, improve pain, and improve overall function.     If plan is discharge home, recommend the following: A lot of help with walking and/or transfers;A lot of help with bathing/dressing/bathroom;Assist for transportation;Help with stairs or ramp for entrance   Can travel by private vehicle     No  Equipment Recommendations  None recommended by PT    Recommendations for Other Services       Precautions / Restrictions Precautions Precautions: Fall Recall of Precautions/Restrictions: Impaired Restrictions Weight Bearing Restrictions Per Provider Order: Yes RLE Weight Bearing Per Provider Order: Weight bearing as tolerated     Mobility  Bed Mobility Overal bed mobility: Needs Assistance Bed Mobility: Supine to Sit     Supine to sit: Max assist     General bed mobility comments: Pt requiring inc time for processing, assist with navigating RLE towards EOB, can get LLE to EOB with consistent verbal cueing, pt demo poor core strength, requiring max a for trunk elevation and scooting to EOB    Transfers Overall transfer level: Needs assistance Equipment used: Rolling walker (2 wheels) Transfers: Sit to/from Stand, Bed to chair/wheelchair/BSC Sit to Stand: Mod assist, Max assist   Step pivot transfers: Max assist, Mod assist       General transfer comment: Inc time needed due to pain, cont difficulty with following commands, mod assist more for STS, max assist during bed>chair for RW management, shifting weight from R/L to aid in LE movement, pt unsteady throughout    Ambulation/Gait Ambulation/Gait assistance: Max assist Gait Distance (Feet): 3 Feet Assistive device: Rolling walker (  2 wheels) Gait Pattern/deviations: Decreased step length - right, Decreased step length - left, Decreased stance time - right, Decreased stride length Gait velocity: decreased     General Gait Details: max assist for RW management, shifting weight from R/L to aid in LE movement, pt  unsteady throughout, cont to demo difficulty with following commands and difficulty from inc pain   Stairs             Wheelchair Mobility     Tilt Bed    Modified Rankin (Stroke Patients Only)       Balance Overall balance assessment: Needs assistance Sitting-balance support: Feet supported, Single extremity supported, Bilateral upper extremity supported Sitting balance-Leahy Scale: Poor Sitting balance - Comments: seated EOB, cont slight posterior lean at times   Standing balance support: During functional activity, Reliant on assistive device for balance, Bilateral upper extremity supported Standing balance-Leahy Scale: Poor Standing balance comment: w/ RW and mod/max A       Communication Communication Communication: Impaired Factors Affecting Communication: Difficulty expressing self  Cognition Arousal: Lethargic Behavior During Therapy: Flat affect   PT - Cognitive impairments: History of cognitive impairments     PT - Cognition Comments: Pt with hx of dementia. Pt daughter reportign she feels cognition has gotten worse since surgery Following commands: Impaired Following commands impaired: Follows one step commands inconsistently    Cueing Cueing Techniques: Verbal cues, Tactile cues, Visual cues  Exercises General Exercises - Lower Extremity Long Arc Quad: AROM, Strengthening, Both, 10 reps, Seated, AAROM, Other (comment) (Active assist for RLE due to pain and weakness) Toe Raises: AROM, Strengthening, Right, 10 reps, Seated Heel Raises: AROM, Strengthening, Right, 10 reps, Seated    General Comments        Pertinent Vitals/Pain Pain Assessment Pain Assessment: Faces Faces Pain Scale: Hurts even more Pain Location: RLE Pain Descriptors / Indicators: Grimacing, Guarding Pain Intervention(s): Limited activity within patient's tolerance, Monitored during session, Repositioned    Home Living                          Prior Function             PT Goals (current goals can now be found in the care plan section) Acute Rehab PT Goals Patient Stated Goal: return home after rehab PT Goal Formulation: With patient/family Time For Goal Achievement: 12/07/23 Potential to Achieve Goals: Good Progress towards PT goals: Progressing toward goals    Frequency    Min 3X/week      PT Plan      Co-evaluation              AM-PAC PT 6 Clicks Mobility   Outcome Measure  Help needed turning from your back to your side while in a flat bed without using bedrails?: A Lot Help needed moving from lying on your back to sitting on the side of a flat bed without using bedrails?: A Lot Help needed moving to and from a bed to a chair (including a wheelchair)?: A Lot Help needed standing up from a chair using your arms (e.g., wheelchair or bedside chair)?: A Lot Help needed to walk in hospital room?: A Lot Help needed climbing 3-5 steps with a railing? : A Lot 6 Click Score: 12    End of Session Equipment Utilized During Treatment: Gait belt Activity Tolerance: Patient limited by pain Patient left: in chair;with family/visitor present;with call bell/phone within reach Nurse Communication: Mobility status PT Visit  Diagnosis: Unsteadiness on feet (R26.81);Muscle weakness (generalized) (M62.81);Other abnormalities of gait and mobility (R26.89)     Time: 8972-8946 PT Time Calculation (min) (ACUTE ONLY): 26 min  Charges:    $Therapeutic Exercise: 8-22 mins $Therapeutic Activity: 8-22 mins PT General Charges $$ ACUTE PT VISIT: 1 Visit                     12:46 PM, 11/25/23 Dudley Cooley Powell-Butler, PT, DPT [place] with [facility]

## 2023-11-25 NOTE — Plan of Care (Signed)

## 2023-11-26 DIAGNOSIS — S72141A Displaced intertrochanteric fracture of right femur, initial encounter for closed fracture: Secondary | ICD-10-CM | POA: Diagnosis not present

## 2023-11-26 LAB — COMPREHENSIVE METABOLIC PANEL WITH GFR
ALT: 15 U/L (ref 0–44)
AST: 38 U/L (ref 15–41)
Albumin: 2.3 g/dL — ABNORMAL LOW (ref 3.5–5.0)
Alkaline Phosphatase: 42 U/L (ref 38–126)
Anion gap: 4 — ABNORMAL LOW (ref 5–15)
BUN: 15 mg/dL (ref 8–23)
CO2: 21 mmol/L — ABNORMAL LOW (ref 22–32)
Calcium: 7.7 mg/dL — ABNORMAL LOW (ref 8.9–10.3)
Chloride: 108 mmol/L (ref 98–111)
Creatinine, Ser: 0.51 mg/dL (ref 0.44–1.00)
GFR, Estimated: >60 mL/min (ref >60)
Glucose, Bld: 105 mg/dL — ABNORMAL HIGH (ref 70–99)
Potassium: 3.7 mmol/L (ref 3.5–5.1)
Sodium: 133 mmol/L — ABNORMAL LOW (ref 135–145)
Total Bilirubin: 1.5 mg/dL — ABNORMAL HIGH (ref 0.0–1.2)
Total Protein: 4 g/dL — ABNORMAL LOW (ref 6.5–8.1)

## 2023-11-26 MED ORDER — TRAMADOL HCL 50 MG PO TABS: 50.0000 mg | ORAL_TABLET | Freq: Four times a day (QID) | ORAL | Status: DC | PRN

## 2023-11-26 MED ORDER — HYDROCODONE-ACETAMINOPHEN 5-325 MG PO TABS: 1.0000 | ORAL_TABLET | ORAL | Status: DC | PRN

## 2023-11-26 MED ORDER — ACETAMINOPHEN 500 MG PO TABS: 1000.0000 mg | ORAL_TABLET | Freq: Two times a day (BID) | ORAL | Status: DC

## 2023-11-26 MED ORDER — ACETAMINOPHEN 500 MG PO TABS
1000.0000 mg | ORAL_TABLET | Freq: Three times a day (TID) | ORAL | Status: DC
  Administered 2023-11-26 – 2023-11-28 (×9): 1000 mg via ORAL
  Filled 2023-11-26 (×7): qty 2

## 2023-11-26 MED ORDER — TIZANIDINE HCL 2 MG PO TABS: 2.0000 mg | ORAL_TABLET | Freq: Four times a day (QID) | ORAL | Status: DC | PRN

## 2023-11-26 MED ORDER — SODIUM CHLORIDE 0.9 % IV SOLN: INTRAVENOUS | Status: AC

## 2023-11-26 NOTE — Plan of Care (Signed)
   Problem: Education: Goal: Knowledge of General Education information will improve Description: Including pain rating scale, medication(s)/side effects and non-pharmacologic comfort measures Outcome: Not Progressing

## 2023-11-26 NOTE — Progress Notes (Signed)
 Progress Note   Patient: Lauren Lawrence FMW:[id number] DOB: [birth year] DOA: 11/20/2023     5 DOS: the patient was seen and examined on 11/26/2023   Brief hospital admission narrative: As per H&P written by Dr. Lawence on a [name] is a 83 y.o. Caucasian female with medical history significant for hypertension, dyslipidemia and depression, who presented to the emergency room with acute onset of fall.  The patient was getting undressed to get ready for bed when she lost her balance and landed on her right hip with subsequent acute pain.  She denies any head injury.  No paresthesias or focal muscle weakness.  No presyncope or syncope.  No chest pain or palpitations.  No fever or chills.  No cough or wheezing or dyspnea.  No bleeding diathesis.  She is status post left hip hemiarthroplasty for previous history of left hip fracture.  The patient had another fall in May per her daughter and has been having chronic back pain since then.   ED Course: Upon presentation to the emergency room, BP was 167/82 with otherwise normal vital signs.  Labs revealed a sodium of 132 and blood glucose of 185 with albumin  of 3.1.  AST was 73 and ALT 48 with total protein 5.7 and total bili 2.2. EKG as reviewed by me : EKG showed normal sinus rhythm with a rate of 65 with poor R wave progression and Q waves inferiorly Imaging: 1. Chronic appearing increased interstitial lung markings without acute or active cardiopulmonary disease. 2. L1 vertebral body compression fracture of indeterminate age. Correlation with physical examination is recommended to determine the presence of point tenderness.   Subjective:  The patient was seen and examined this morning, stable in no acute distress, pleasantly confused.  Family eager for Foley catheter to be discontinued Per nursing staff adequate urine output    Assessment and plan:  Closed right hip fracture, initial encounter (HCC) -Postop day #4 - S/p ORIF right hip  11/22/2023 - Remained hemodynamically stable - Modify and continue as needed analgesics   Hyponatremia -Continue to improve Acute on chronic hyponatremia in the setting of poor oral intake, mild dehydration - Poor p.o. intake - encouraging p.o. intake, gentle IV fluid hydration - Gentle fluid has been provided - Will follow electrolytes trend.   Urinary retention  After multiple attempts of In-N-Out, Foley catheter was placed 11/23/23 discontinued 11/26/2023 Voiding trial-- - Will monitor closely, continue Flomax  - Monitoring urine output - Patient remained hemodynamically stable normal BUN/creatinine  Poor appetite, malnutrition Body mass index is 16.35 kg/m. Initiating appetite stimulant Megace  -Encouraging oral intake  Hypoalbuminemia -Replace albumin   Dementia  Advance dementia with chronic cognitive debility -Per daughter patient has been taken off medication few years ago due to side effects - Monitoring for delirium exacerbated by dementia, sundowning As needed Haldol   hypothyroidism  -Continue Synthroid .  Essential hypertension - Blood pressure remained stable,  - As it has been running soft- holding Cozaar , reduced metoprolol  dose to 25 mg daily   Elevated LFTs -In the setting of a stress demargination most likely - Continue maintaining adequate hydration - Follow LFTs.    chronic back pain - This is likely secondary to her old L1 vertebral compression fracture. -Continue as needed analgesics.     ------------------------------------------------------------------------------------------------------------Phy Blood pressure (!) 113/46, pulse 81, temperature 97.8 F (36.6 C), temperature source Axillary, resp. rate 14, height 5' 4 (1.626 m), weight 43.2 kg, SpO2 98%.   General:  AAO x 1,  cooperative, no distress;   HEENT:  Normocephalic, PERRL, otherwise with in Normal limits   Neuro:  CNII-XII intact. , normal motor and sensation, reflexes intact    Lungs:   Clear to auscultation BL, Respirations unlabored,  No wheezes / crackles  Cardio:    S1/S2, RRR, No murmure, No Rubs or Gallops   Abdomen:  Soft, non-tender, bowel sounds active all four quadrants, no guarding or peritoneal signs.  Muscular  skeletal:  Range of motion limited at right hip, surgical wound at lateral right hip area, dressing in place negative for edema erythema or drainage Limited exam -global generalized weaknesses - in bed, able to move all 4 extremities,   2+ pulses,  symmetric, No pitting edema  Skin:  Dry, warm to touch, negative for any Rashes,  Wounds: Right hip surgical wound       Data Reviewed:    Latest Ref Rng & Units 11/24/2023    4:59 AM 11/23/2023    7:00 AM 11/22/2023    5:53 PM  CBC  WBC 4.0 - 10.5 K/uL 4.8  7.4  8.1   Hemoglobin 12.0 - 15.0 g/dL 9.1  89.6  88.5   Hematocrit 36.0 - 46.0 % 27.0  29.4  32.6   Platelets 150 - 400 K/uL 92  105  116       Latest Ref Rng & Units 11/26/2023    4:43 AM 11/25/2023   12:54 PM 11/24/2023    4:59 AM  CMP  Glucose 70 - 99 mg/dL 894  860  894   BUN 8 - 23 mg/dL 15  17  14    Creatinine 0.44 - 1.00 mg/dL 9.48  9.44  9.42   Sodium 135 - 145 mmol/L 133  132  129   Potassium 3.5 - 5.1 mmol/L 3.7  4.0  3.7   Chloride 98 - 111 mmol/L 108  104  105   CO2 22 - 32 mmol/L 21  20  19    Calcium 8.9 - 10.3 mg/dL 7.7  7.9  7.6   Total Protein 6.5 - 8.1 g/dL 4.0  4.2    Total Bilirubin 0.0 - 1.2 mg/dL 1.5  1.4    Alkaline Phos 38 - 126 U/L 42  56    AST 15 - 41 U/L 38  46    ALT 0 - 44 U/L 15  20       Family Communication: Daughter is at bedside.  Disposition: Status is: Inpatient Remains inpatient appropriate because: Continue current plan of care for surgical repair of hip fracture.  Disposition:  most likely discharge to skilled nursing facility for short-term rehabilitation after repair.   Follow assessment and recommendations by PT/OT after surgery. TOC assisting for placement  Family-daughter  requesting for patient not to be discharged with Foley catheter.  Family has special requests for place at [facility]  Time spent: 50 minutes  Author: Adriana DELENA Grams, MD 11/26/2023 12:42 PM  For on call review [email or web address].

## 2023-11-26 NOTE — Progress Notes (Signed)
 Foley d/c'd

## 2023-11-27 DIAGNOSIS — S72141A Displaced intertrochanteric fracture of right femur, initial encounter for closed fracture: Secondary | ICD-10-CM | POA: Diagnosis not present

## 2023-11-27 LAB — COMPREHENSIVE METABOLIC PANEL WITH GFR
ALT: 17 U/L (ref 0–44)
AST: 39 U/L (ref 15–41)
Albumin: 2.4 g/dL — ABNORMAL LOW (ref 3.5–5.0)
Alkaline Phosphatase: 51 U/L (ref 38–126)
Anion gap: 7 (ref 5–15)
BUN: 13 mg/dL (ref 8–23)
CO2: 20 mmol/L — ABNORMAL LOW (ref 22–32)
Calcium: 7.8 mg/dL — ABNORMAL LOW (ref 8.9–10.3)
Chloride: 103 mmol/L (ref 98–111)
Creatinine, Ser: 0.41 mg/dL — ABNORMAL LOW (ref 0.44–1.00)
GFR, Estimated: >60 mL/min (ref >60)
Glucose, Bld: 96 mg/dL (ref 70–99)
Potassium: 3.7 mmol/L (ref 3.5–5.1)
Sodium: 130 mmol/L — ABNORMAL LOW (ref 135–145)
Total Bilirubin: 1.8 mg/dL — ABNORMAL HIGH (ref 0.0–1.2)
Total Protein: 4.3 g/dL — ABNORMAL LOW (ref 6.5–8.1)

## 2023-11-27 MED ORDER — SODIUM CHLORIDE 0.9 % IV SOLN: INTRAVENOUS | Status: AC

## 2023-11-27 NOTE — Progress Notes (Signed)
 Progress Note   Patient: Lauren Lawrence FMW:[id number] DOB: 11/26/1940 DOA: 11/20/2023     6 DOS: the patient was seen and examined on 11/27/2023   Brief hospital admission narrative: As per H&P written by Dr. Lawence on a [name] is a 83 y.o. Caucasian female with medical history significant for hypertension, dyslipidemia and depression, who presented to the emergency room with acute onset of fall.  The patient was getting undressed to get ready for bed when she lost her balance and landed on her right hip with subsequent acute pain.  She denies any head injury.  No paresthesias or focal muscle weakness.  No presyncope or syncope.  No chest pain or palpitations.  No fever or chills.  No cough or wheezing or dyspnea.  No bleeding diathesis.  She is status post left hip hemiarthroplasty for previous history of left hip fracture.  The patient had another fall in May per her daughter and has been having chronic back pain since then.   ED Course: Upon presentation to the emergency room, BP was 167/82 with otherwise normal vital signs.  Labs revealed a sodium of 132 and blood glucose of 185 with albumin  of 3.1.  AST was 73 and ALT 48 with total protein 5.7 and total bili 2.2. EKG as reviewed by me : EKG showed normal sinus rhythm with a rate of 65 with poor R wave progression and Q waves inferiorly Imaging: 1. Chronic appearing increased interstitial lung markings without acute or active cardiopulmonary disease. 2. L1 vertebral body compression fracture of indeterminate age. Correlation with physical examination is recommended to determine the presence of point tenderness.   Subjective:  The patient was seen and examined this morning, speech slow but at baseline, cognition at baseline oriented x 1 following commands In no acute distress  Foley catheter has been discontinued patient has been voiding Hemodynamically stable    Assessment and plan:  Closed right hip fracture, initial  encounter (HCC) -Postop day #5 - S/p ORIF right hip 11/22/2023 - Hemodynamically stable, tapering off analgesics   Hyponatremia - Monitoring, Acute on chronic hyponatremia in the setting of poor oral intake, mild dehydration - Poor p.o. intake - encouraging p.o. intake, gentle IV fluid hydration - Gentle fluid has been provided - Will follow electrolytes trend.   Urinary retention  ,- Foley catheter was placed 11/23/23 discontinued 11/26/2023 - Will monitor closely, continue Flomax  - Monitoring urine output - Patient remained hemodynamically stable normal BUN/creatinine  Poor appetite, malnutrition Body mass index is 16.35 kg/m. Initiating appetite stimulant Megace  -Encouraging oral intake  Hypoalbuminemia -Replace albumin   Dementia  Advance dementia with chronic cognitive debility -Per daughter patient has been taken off medication few years ago due to side effects - Monitoring for delirium exacerbated by dementia, sundowning As needed Haldol   hypothyroidism  -Continue Synthroid .  Essential hypertension - Blood pressure remained stable,  - As it has been running soft- holding Cozaar , reduced metoprolol  dose to 25 mg daily   Elevated LFTs -In the setting of a stress demargination most likely - Continue maintaining adequate hydration - Follow LFTs.    chronic back pain - This is likely secondary to her old L1 vertebral compression fracture. -Continue as needed analgesics.     ------------------------------------------------------------------------------------------------------------Phy Blood pressure 136/60, pulse 97, temperature 98.3 F (36.8 C), temperature source Oral, resp. rate 19, height 5' 4 (1.626 m), weight 43.2 kg, SpO2 96%.      General:  AAO x 1,  cooperative, no distress;  Pleasantly confused  HEENT:  Normocephalic, PERRL, otherwise with in Normal limits   Neuro:  CNII-XII intact. , normal motor and sensation, reflexes intact   Lungs:   Clear  to auscultation BL, Respirations unlabored,  No wheezes / crackles  Cardio:    S1/S2, RRR, No murmure, No Rubs or Gallops   Abdomen:  Soft, non-tender, bowel sounds active all four quadrants, no guarding or peritoneal signs.  Muscular  skeletal:  Limited exam -global generalized weaknesses - in bed, able to move all 4 extremities,   2+ pulses,  symmetric, No pitting edema  Skin:  Dry, warm to touch, negative for any Rashes,  Wounds: Please see nursing documentation          Data Reviewed:    Latest Ref Rng & Units 11/24/2023    4:59 AM 11/23/2023    7:00 AM 11/22/2023    5:53 PM  CBC  WBC 4.0 - 10.5 K/uL 4.8  7.4  8.1   Hemoglobin 12.0 - 15.0 g/dL 9.1  89.6  88.5   Hematocrit 36.0 - 46.0 % 27.0  29.4  32.6   Platelets 150 - 400 K/uL 92  105  116       Latest Ref Rng & Units 11/27/2023    5:09 AM 11/26/2023    4:43 AM 11/25/2023   12:54 PM  CMP  Glucose 70 - 99 mg/dL 96  894  860   BUN 8 - 23 mg/dL 13  15  17    Creatinine 0.44 - 1.00 mg/dL 9.58  9.48  9.44   Sodium 135 - 145 mmol/L 130  133  132   Potassium 3.5 - 5.1 mmol/L 3.7  3.7  4.0   Chloride 98 - 111 mmol/L 103  108  104   CO2 22 - 32 mmol/L 20  21  20    Calcium 8.9 - 10.3 mg/dL 7.8  7.7  7.9   Total Protein 6.5 - 8.1 g/dL 4.3  4.0  4.2   Total Bilirubin 0.0 - 1.2 mg/dL 1.8  1.5  1.4   Alkaline Phos 38 - 126 U/L 51  42  56   AST 15 - 41 U/L 39  38  46   ALT 0 - 44 U/L 17  15  20       Family Communication: Daughter is at bedside.  Disposition: Status is: Inpatient Remains inpatient appropriate because: Continue current plan of care for surgical repair of hip fracture.  Disposition:  most likely discharge to skilled nursing facility for short-term rehabilitation  This Monday, 11/28/2023  TOC assisting for placement  Family-daughters updated Family has special requests for place [facility]  Time spent: 50 minutes  Author: Adriana DELENA Grams, MD 11/27/2023 12:06 PM  For on call review  [email or web address].

## 2023-11-27 NOTE — TOC CM/SW Note (Signed)
 Insurance auth approved: [id number], expires 11/28/2023. No one available at [employer] to update over the weekend.

## 2023-11-28 ENCOUNTER — Telehealth: Payer: Self-pay

## 2023-11-28 DIAGNOSIS — W19XXXD Unspecified fall, subsequent encounter: Secondary | ICD-10-CM | POA: Diagnosis not present

## 2023-11-28 DIAGNOSIS — Z743 Need for continuous supervision: Secondary | ICD-10-CM | POA: Diagnosis not present

## 2023-11-28 DIAGNOSIS — I10 Essential (primary) hypertension: Secondary | ICD-10-CM | POA: Diagnosis present

## 2023-11-28 DIAGNOSIS — E46 Unspecified protein-calorie malnutrition: Secondary | ICD-10-CM | POA: Diagnosis not present

## 2023-11-28 DIAGNOSIS — S72141D Displaced intertrochanteric fracture of right femur, subsequent encounter for closed fracture with routine healing: Secondary | ICD-10-CM | POA: Diagnosis not present

## 2023-11-28 DIAGNOSIS — G8929 Other chronic pain: Secondary | ICD-10-CM | POA: Diagnosis present

## 2023-11-28 DIAGNOSIS — Z96642 Presence of left artificial hip joint: Secondary | ICD-10-CM | POA: Diagnosis present

## 2023-11-28 DIAGNOSIS — Z6823 Body mass index (BMI) 23.0-23.9, adult: Secondary | ICD-10-CM | POA: Diagnosis not present

## 2023-11-28 DIAGNOSIS — R000 Tachycardia, unspecified: Secondary | ICD-10-CM | POA: Diagnosis not present

## 2023-11-28 DIAGNOSIS — E8770 Fluid overload, unspecified: Secondary | ICD-10-CM | POA: Diagnosis present

## 2023-11-28 DIAGNOSIS — E039 Hypothyroidism, unspecified: Secondary | ICD-10-CM | POA: Diagnosis present

## 2023-11-28 DIAGNOSIS — I6523 Occlusion and stenosis of bilateral carotid arteries: Secondary | ICD-10-CM | POA: Diagnosis not present

## 2023-11-28 DIAGNOSIS — Z79890 Hormone replacement therapy (postmenopausal): Secondary | ICD-10-CM | POA: Diagnosis not present

## 2023-11-28 DIAGNOSIS — F32A Depression, unspecified: Secondary | ICD-10-CM | POA: Diagnosis not present

## 2023-11-28 DIAGNOSIS — F0390 Unspecified dementia without behavioral disturbance: Secondary | ICD-10-CM | POA: Diagnosis not present

## 2023-11-28 DIAGNOSIS — K5900 Constipation, unspecified: Secondary | ICD-10-CM | POA: Diagnosis not present

## 2023-11-28 DIAGNOSIS — R748 Abnormal levels of other serum enzymes: Secondary | ICD-10-CM | POA: Diagnosis not present

## 2023-11-28 DIAGNOSIS — R400 Somnolence: Secondary | ICD-10-CM | POA: Diagnosis not present

## 2023-11-28 DIAGNOSIS — F03A3 Unspecified dementia, mild, with mood disturbance: Secondary | ICD-10-CM | POA: Diagnosis not present

## 2023-11-28 DIAGNOSIS — D689 Coagulation defect, unspecified: Secondary | ICD-10-CM | POA: Diagnosis present

## 2023-11-28 DIAGNOSIS — I119 Hypertensive heart disease without heart failure: Secondary | ICD-10-CM | POA: Diagnosis not present

## 2023-11-28 DIAGNOSIS — Z66 Do not resuscitate: Secondary | ICD-10-CM | POA: Diagnosis present

## 2023-11-28 DIAGNOSIS — R262 Difficulty in walking, not elsewhere classified: Secondary | ICD-10-CM | POA: Diagnosis present

## 2023-11-28 DIAGNOSIS — E871 Hypo-osmolality and hyponatremia: Secondary | ICD-10-CM | POA: Diagnosis present

## 2023-11-28 DIAGNOSIS — E8809 Other disorders of plasma-protein metabolism, not elsewhere classified: Secondary | ICD-10-CM | POA: Diagnosis present

## 2023-11-28 DIAGNOSIS — M4856XD Collapsed vertebra, not elsewhere classified, lumbar region, subsequent encounter for fracture with routine healing: Secondary | ICD-10-CM | POA: Diagnosis not present

## 2023-11-28 DIAGNOSIS — B961 Klebsiella pneumoniae [K. pneumoniae] as the cause of diseases classified elsewhere: Secondary | ICD-10-CM | POA: Diagnosis present

## 2023-11-28 DIAGNOSIS — R17 Unspecified jaundice: Secondary | ICD-10-CM | POA: Diagnosis not present

## 2023-11-28 DIAGNOSIS — K7682 Hepatic encephalopathy: Secondary | ICD-10-CM | POA: Diagnosis present

## 2023-11-28 DIAGNOSIS — Z79899 Other long term (current) drug therapy: Secondary | ICD-10-CM | POA: Diagnosis not present

## 2023-11-28 DIAGNOSIS — K7460 Unspecified cirrhosis of liver: Secondary | ICD-10-CM | POA: Diagnosis present

## 2023-11-28 DIAGNOSIS — R4182 Altered mental status, unspecified: Secondary | ICD-10-CM | POA: Diagnosis not present

## 2023-11-28 DIAGNOSIS — E440 Moderate protein-calorie malnutrition: Secondary | ICD-10-CM | POA: Diagnosis present

## 2023-11-28 DIAGNOSIS — E7800 Pure hypercholesterolemia, unspecified: Secondary | ICD-10-CM | POA: Diagnosis not present

## 2023-11-28 DIAGNOSIS — D509 Iron deficiency anemia, unspecified: Secondary | ICD-10-CM | POA: Diagnosis not present

## 2023-11-28 DIAGNOSIS — E43 Unspecified severe protein-calorie malnutrition: Secondary | ICD-10-CM | POA: Diagnosis not present

## 2023-11-28 DIAGNOSIS — R109 Unspecified abdominal pain: Secondary | ICD-10-CM | POA: Diagnosis not present

## 2023-11-28 DIAGNOSIS — M810 Age-related osteoporosis without current pathological fracture: Secondary | ICD-10-CM | POA: Diagnosis not present

## 2023-11-28 DIAGNOSIS — E785 Hyperlipidemia, unspecified: Secondary | ICD-10-CM | POA: Diagnosis present

## 2023-11-28 DIAGNOSIS — R140 Abdominal distension (gaseous): Secondary | ICD-10-CM | POA: Diagnosis not present

## 2023-11-28 DIAGNOSIS — R600 Localized edema: Secondary | ICD-10-CM | POA: Diagnosis not present

## 2023-11-28 DIAGNOSIS — S72001A Fracture of unspecified part of neck of right femur, initial encounter for closed fracture: Secondary | ICD-10-CM | POA: Diagnosis not present

## 2023-11-28 DIAGNOSIS — R339 Retention of urine, unspecified: Secondary | ICD-10-CM | POA: Diagnosis present

## 2023-11-28 DIAGNOSIS — R410 Disorientation, unspecified: Secondary | ICD-10-CM | POA: Diagnosis not present

## 2023-11-28 DIAGNOSIS — S72141A Displaced intertrochanteric fracture of right femur, initial encounter for closed fracture: Secondary | ICD-10-CM | POA: Diagnosis not present

## 2023-11-28 DIAGNOSIS — R188 Other ascites: Secondary | ICD-10-CM | POA: Diagnosis not present

## 2023-11-28 DIAGNOSIS — N390 Urinary tract infection, site not specified: Secondary | ICD-10-CM | POA: Diagnosis present

## 2023-11-28 DIAGNOSIS — Z8249 Family history of ischemic heart disease and other diseases of the circulatory system: Secondary | ICD-10-CM | POA: Diagnosis not present

## 2023-11-28 DIAGNOSIS — G309 Alzheimer's disease, unspecified: Secondary | ICD-10-CM | POA: Diagnosis not present

## 2023-11-28 MED ORDER — ACETAMINOPHEN 500 MG PO TABS: 1000.0000 mg | ORAL_TABLET | Freq: Three times a day (TID) | ORAL | 0 refills | Status: AC

## 2023-11-28 MED ORDER — TAMSULOSIN HCL 0.4 MG PO CAPS: 0.4000 mg | ORAL_CAPSULE | Freq: Every day | ORAL | 1 refills | Status: DC

## 2023-11-28 MED ORDER — TRAMADOL HCL 50 MG PO TABS: 50.0000 mg | ORAL_TABLET | Freq: Four times a day (QID) | ORAL | 0 refills | Status: AC | PRN

## 2023-11-28 MED ORDER — ENSURE PLUS HIGH PROTEIN PO LIQD: 237.0000 mL | Freq: Two times a day (BID) | ORAL | 0 refills | Status: AC

## 2023-11-28 MED ORDER — ONDANSETRON HCL 4 MG PO TABS: 4.0000 mg | ORAL_TABLET | Freq: Four times a day (QID) | ORAL | 0 refills | Status: DC | PRN

## 2023-11-28 MED ORDER — PHENOL 1.4 % MT LIQD: 1.0000 | OROMUCOSAL | 0 refills | Status: DC | PRN

## 2023-11-28 MED ORDER — MAGNESIUM CITRATE PO SOLN
1.0000 | Freq: Once | ORAL | Status: AC
  Administered 2023-11-28 (×2): 1 via ORAL
  Filled 2023-11-28: qty 296

## 2023-11-28 MED ORDER — DOCUSATE SODIUM 100 MG PO CAPS: 100.0000 mg | ORAL_CAPSULE | Freq: Two times a day (BID) | ORAL | 0 refills | Status: DC

## 2023-11-28 MED ORDER — ENOXAPARIN SODIUM 30 MG/0.3ML IJ SOSY: 30.0000 mg | PREFILLED_SYRINGE | INTRAMUSCULAR | 0 refills | Status: AC

## 2023-11-28 MED ORDER — MEGESTROL ACETATE 400 MG/10ML PO SUSP: 400.0000 mg | Freq: Every day | ORAL | 0 refills | Status: DC

## 2023-11-28 NOTE — Telephone Encounter (Signed)
 Copied from CRM #[id number]. Topic: Clinical - Home Health Verbal Orders >> Nov 28, 2023 11:09 AM Rosaria BRAVO wrote: Charmaine from [facility] called to report that they received orders for palliative care and will report to the pt's PCP for updates.  Best contact: [phone]

## 2023-11-28 NOTE — Care Management Important Message (Signed)
 Important Message  Patient Details  Name: Lauren Lawrence MRN: [id number] Date of Birth: 1940/11/04   Important Message Given:        [name] [name] 11/28/2023, 10:34 AM

## 2023-11-28 NOTE — TOC Transition Note (Signed)
 Transition of Care [facility]) - Discharge Note   Patient Details  Name: Lauren Lawrence MRN: [id number] Date of Birth: 1940-06-12  Transition of Care [facility]) CM/SW Contact:  Lucie Lunger, LCSWA Phone Number: 11/28/2023, 9:53 AM   Clinical Narrative:    CSW updated that pt is medically stable for D/C to [facility] for SNF. CSW spoke to admissions who states they can accept. CSW sent D/C clinicals to facility via HUB. CSW updated pts daughter of plan for D/C to SNF today. RN provided with room and report numbers. Med necessity printed to floor for RN. EMS called for transport. TOC signing off.   Final next level of care: Skilled Nursing Facility Barriers to Discharge: Barriers Resolved   Patient Goals and CMS Choice Patient states their goals for this hospitalization and ongoing recovery are:: go to SNF CMS Medicare.gov Compare Post Acute Care list provided to:: Patient Represenative (must comment) Choice offered to / list presented to : Adult Children      Discharge Placement              Patient chooses bed at: [facility] Patient to be transferred to facility by: EMS Name of family member notified: daughter Patient and family notified of of transfer: 11/28/23  Discharge Plan and Services Additional resources added to the After Visit Summary for   In-house Referral: Clinical Social Work Discharge Planning Services: CM Consult Post Acute Care Choice: Skilled Nursing Facility                               Social Drivers of Health (SDOH) Interventions SDOH Screenings   Food Insecurity: No Food Insecurity (11/21/2023)  Housing: Unknown (11/21/2023)  Transportation Needs: No Transportation Needs (11/21/2023)  Utilities: Patient Declined (11/21/2023)  Alcohol Screen: Low Risk  (05/14/2022)  Depression (PHQ2-9): Medium Risk (09/02/2023)  Financial Resource Strain: Low Risk  (07/05/2022)  Physical Activity: Insufficiently Active (07/05/2022)  Social Connections:  Patient Declined (11/21/2023)  Stress: No Stress Concern Present (07/05/2022)  Tobacco Use: Low Risk  (11/22/2023)     Readmission Risk Interventions    11/21/2023   10:14 AM  Readmission Risk Prevention Plan  Post Dischage Appt Complete  Medication Screening Complete  Transportation Screening Complete

## 2023-11-28 NOTE — Plan of Care (Signed)
  Problem: Clinical Measurements: Goal: Ability to maintain clinical measurements within normal limits will improve Outcome: Progressing Goal: Will remain free from infection Outcome: Progressing Goal: Diagnostic test results will improve Outcome: Progressing Goal: Respiratory complications will improve Outcome: Progressing   Problem: Activity: Goal: Risk for activity intolerance will decrease Outcome: Progressing   Problem: Coping: Goal: Level of anxiety will decrease Outcome: Progressing   Problem: Pain Managment: Goal: General experience of comfort will improve and/or be controlled Outcome: Progressing   Problem: Activity: Goal: Ability to ambulate and perform ADLs will improve Outcome: Progressing   Problem: Clinical Measurements: Goal: Postoperative complications will be avoided or minimized Outcome: Progressing   Problem: Education: Goal: Knowledge of General Education information will improve Description: Including pain rating scale, medication(s)/side effects and non-pharmacologic comfort measures Outcome: Not Progressing   Problem: Health Behavior/Discharge Planning: Goal: Ability to manage health-related needs will improve Outcome: Not Progressing   Problem: Nutrition: Goal: Adequate nutrition will be maintained Outcome: Not Progressing   Problem: Elimination: Goal: Will not experience complications related to bowel motility Outcome: Not Progressing Goal: Will not experience complications related to urinary retention Outcome: Not Progressing

## 2023-11-28 NOTE — Discharge Summary (Signed)
 Physician Discharge Summary   Patient: Lauren Lawrence MRN: [id number] DOB: [birth year]  Admit date:     11/20/2023  Discharge date: 11/28/23  Discharge Physician: Adriana DELENA Grams   PCP: [name], MD   Recommendations at discharge:   Continue ambulation as possible, PT OT, fall precaution Follow-up with orthopedic team in 1 week Follow-up with the PCP in 1-2 weeks  Discharge Diagnoses: Principal Problem:   Displaced intertrochanteric fracture of right femur, initial encounter for closed fracture Lauren Lawrence) Active Problems:   Hypothyroidism   Hyponatremia   Elevated LFTs   Essential hypertension   Chronic back pain   Protein-calorie malnutrition, severe   As per H&P written by Dr. Lawence on a [name] is a 83 y.o. Caucasian female with medical history significant for hypertension, dyslipidemia and depression, who presented to the emergency room with acute onset of fall.  The patient was getting undressed to get ready for bed when she lost her balance and landed on her right hip with subsequent acute pain.  She denies any head injury.  No paresthesias or focal muscle weakness.  No presyncope or syncope.  No chest pain or palpitations.  No fever or chills.  No cough or wheezing or dyspnea.  No bleeding diathesis.  She is status post left hip hemiarthroplasty for previous history of left hip fracture.  The patient had another fall in May per her daughter and has been having chronic back pain since then.   ED Course: Upon presentation to the emergency room, BP was 167/82 with otherwise normal vital signs.  Labs revealed a sodium of 132 and blood glucose of 185 with albumin  of 3.1.  AST was 73 and ALT 48 with total protein 5.7 and total bili 2.2. EKG as reviewed by me : EKG showed normal sinus rhythm with a rate of 65 with poor R wave progression and Q waves inferiorly Imaging: 1. Chronic appearing increased interstitial lung markings without acute or active cardiopulmonary  disease. 2. L1 vertebral body compression fracture of indeterminate age. Correlation with physical examination is recommended to determine the presence of point tenderness.     Subjective:  The patient was seen and examined this morning, speech slow but at baseline, cognition at baseline oriented x 1 following commands In no acute distress   Foley catheter has been discontinued patient has been voiding Hemodynamically stable         Closed right hip fracture, initial encounter (HCC) -Postop day #6 - S/p ORIF right hip 11/22/2023 - Hemodynamically stable,  - Tapering off analgesics due to increased confusion -Follow-up with orthopedic team in 1 week -Continue PT OT, weightbearing per orthopedic team -Right hip wound care per wound instructions     Hyponatremia - Monitoring, improved to 129, 133, 130 Acute on chronic hyponatremia in the setting of poor oral intake, mild dehydration - Poor p.o. intake - encouraging p.o. intake, gentle IV fluid hydration - S/p gentle IV fluid hydration.     Urinary retention  ,- Foley catheter was placed 11/23/23 discontinued 11/26/2023 - Will monitor closely, continue Flomax  - Monitoring urine output - Patient remained hemodynamically stable normal BUN/creatinine   Poor appetite, malnutrition Body mass index is 16.35 kg/m. Initiating appetite stimulant Megace  -Encouraging oral intake   Hypoalbuminemia -Replace albumin    Dementia  Advance dementia with chronic cognitive debility -Per daughter patient has been taken off medication few years ago due to side effects - Monitoring for delirium exacerbated by dementia, sundowning As needed Haldol  hypothyroidism  -Continue Synthroid .   Essential hypertension - Blood pressure remained stable,  - As it has been running soft- holding Cozaar , reduced metoprolol  dose to 25 mg daily     Elevated LFTs -Resolved -In the setting of a stress demargination most likely - Continue maintaining  adequate PO hydration     Ethics -remains limited DNR Recommended patient to follow-up with outpatient palliative care   chronic back pain - This is likely secondary to her old L1 vertebral compression fracture. -Continue as needed analgesics.     Pain control - [place]  Controlled Substance Reporting System database was reviewed. and patient was instructed, not to drive, operate heavy machinery, perform activities at heights, swimming or participation in water activities or provide baby-sitting services while on Pain, Sleep and Anxiety Medications; until their outpatient Physician has advised to do so again. Also recommended to not to take more than prescribed Pain, Sleep and Anxiety Medications.  Consultants: Orthopedic team Procedures performed: ORIF right hip Disposition: Skilled nursing facility Diet recommendation:  Discharge Diet Orders (From admission, onward)     Start     Ordered   11/28/23 0000  Diet - low sodium heart healthy        11/28/23 0729           Regular diet DISCHARGE MEDICATION: Allergies as of 11/28/2023       Reactions   Cat Dander Other (See Comments)   Sinus issues        Medication List     STOP taking these medications    losartan  100 MG tablet Commonly known as: COZAAR        TAKE these medications    acetaminophen  500 MG tablet Commonly known as: TYLENOL  Take 2 tablets (1,000 mg total) by mouth every 8 (eight) hours for 5 days. What changed:  how much to take when to take this reasons to take this   Coenzyme Q10 10 MG capsule Take 10 mg by mouth daily.   docusate sodium  100 MG capsule Commonly known as: COLACE Take 1 capsule (100 mg total) by mouth 2 (two) times daily.   enoxaparin  30 MG/0.3ML injection Commonly known as: LOVENOX  Inject 0.3 mLs (30 mg total) into the skin daily for 14 days.   feeding supplement Liqd Take 237 mLs by mouth 2 (two) times daily between meals.   Iron  325 (65 Fe) MG Tabs Take 1  tablet (325 mg total) by mouth daily.   levothyroxine  100 MCG tablet Commonly known as: SYNTHROID  Take 1 tablet (100 mcg total) by mouth daily.   megestrol  400 MG/10ML suspension Commonly known as: MEGACE  Take 10 mLs (400 mg total) by mouth daily.   metoprolol  succinate 100 MG 24 hr tablet Commonly known as: TOPROL -XL Take 1 tablet (100 mg total) by mouth daily. Take with or immediately following a meal.   multivitamin with minerals Tabs tablet Take 1 tablet by mouth daily.   ondansetron  4 MG tablet Commonly known as: ZOFRAN  Take 1 tablet (4 mg total) by mouth every 6 (six) hours as needed for nausea.   phenol 1.4 % Liqd Commonly known as: CHLORASEPTIC Use as directed 1 spray in the mouth or throat as needed for throat irritation / pain.   risedronate  35 MG tablet Commonly known as: Actonel  Take 1 tablet (35 mg total) by mouth every 7 (seven) days. with water on empty stomach, nothing by mouth or lie down for next 30 minutes.   tamsulosin  0.4 MG Caps capsule Commonly known as:  FLOMAX  Take 1 capsule (0.4 mg total) by mouth daily.   traMADol  50 MG tablet Commonly known as: ULTRAM  Take 1 tablet (50 mg total) by mouth every 6 (six) hours as needed for up to 5 days for moderate pain (pain score 4-6).               Discharge Care Instructions  (From admission, onward)           Start     Ordered   11/28/23 0000  Discharge wound care:       Comments: Wound care per wound care RN instructions   11/28/23 0729            Contact information for after-discharge care     Destination     [facility] .   Service: Skilled Nursing Contact information: [address] [place]  [address] [phone]                    Discharge Exam: Fredricka Weights   11/20/23 2243 11/21/23 0129  Weight: 41 kg 43.2 kg        General:  AAO x 1,  cooperative, no distress;   HEENT:   Normocephalic, PERRL, otherwise with in Normal limits   Neuro:  CNII-XII intact. , normal motor and sensation, reflexes intact   Lungs:   Clear to auscultation BL, Respirations unlabored,  No wheezes / crackles  Cardio:    S1/S2, RRR, No murmure, No Rubs or Gallops   Abdomen:  Soft, non-tender, bowel sounds active all four quadrants, no guarding or peritoneal signs.  Muscular  skeletal:  Limited exam -global generalized weaknesses - in bed, able to move all 4 extremities,   2+ pulses,  symmetric, No pitting edema  Skin:  Dry, warm to touch, negative for any Rashes,  Wounds: Please see nursing documentation          Condition at discharge: fair  The results of significant diagnostics from this hospitalization (including imaging, microbiology, ancillary and laboratory) are listed below for reference.   Imaging Studies: DG HIP UNILAT W OR W/O PELVIS 2-3 VIEWS RIGHT Result Date: 11/22/2023 CLINICAL DATA:  Hip fracture, postop. EXAM: DG HIP (WITH OR WITHOUT PELVIS) 2-3V RIGHT COMPARISON:  Preoperative imaging FINDINGS: Femoral intramedullary nail with trans trochanteric and distal locking and screw fixation traverse proximal femur fracture. Improved fracture alignment from preoperative imaging. Recent postsurgical change includes air and edema in the soft tissues. Left hip arthroplasty is partially included in the field of view. IMPRESSION: ORIF of proximal femur fracture without immediate postoperative complication. Electronically Signed   By: Andrea Gasman M.D.   On: 11/22/2023 16:15   DG HIP UNILAT WITH PELVIS 2-3 VIEWS RIGHT Result Date: 11/22/2023 CLINICAL DATA:  Intertrochanteric right femur fracture. EXAM: DG HIP (WITH OR WITHOUT PELVIS) 2-3V RIGHT COMPARISON:  11/20/2023 FINDINGS: Eight fluoroscopic spot views of the right hip submitted from the operating room. Femoral intramedullary nail with trans trochanteric and distal locking screw fixation traverse intertrochanteric femur  fracture. Fluoroscopy time 2 minutes 34 seconds, dose 28.34 mGy. IMPRESSION: Fluoroscopic spot views during ORIF of right intertrochanteric femur fracture. Electronically Signed   By: Andrea Gasman M.D.   On: 11/22/2023 16:14   DG C-Arm 1-60 Min-No Report Result Date: 11/22/2023 Fluoroscopy was utilized by the requesting physician.  No radiographic interpretation.   DG Chest 1 View Result Date: 11/20/2023 CLINICAL DATA:  Status post fall. EXAM: CHEST  1  VIEW COMPARISON:  Sep 06, 2023 FINDINGS: The heart size and mediastinal contours are within normal limits. Mild, stable, diffuse, chronic appearing increased interstitial lung markings are seen. No acute infiltrate, pleural effusion or pneumothorax is identified. A compression fracture deformity of indeterminate age is seen at the level of L1. IMPRESSION: 1. Chronic appearing increased interstitial lung markings without acute or active cardiopulmonary disease. 2. L1 vertebral body compression fracture of indeterminate age. Correlation with physical examination is recommended to determine the presence of point tenderness. Electronically Signed   By: Suzen Dials M.D.   On: 11/20/2023 23:24   DG Hip Unilat  With Pelvis 2-3 Views Right Result Date: 11/20/2023 CLINICAL DATA:  Status post fall. EXAM: DG HIP (WITH OR WITHOUT PELVIS) 2-3V RIGHT COMPARISON:  None Available. FINDINGS: An acute fracture deformity is seen extending through the inter trochanteric region of the proximal right femur. There is mild dorsal angulation of the fracture apex without evidence of dislocation. An intact left hip replacement is noted. IMPRESSION: Acute intertrochanteric fracture of the proximal right femur. Electronically Signed   By: Suzen Dials M.D.   On: 11/20/2023 23:22    Microbiology: Results for orders placed or performed during the Lawrence encounter of 11/20/23  Surgical pcr screen     Status: None   Collection Time: 11/21/23  1:30 AM   Specimen: Nasal  Mucosa; Nasal Swab  Result Value Ref Range Status   MRSA, PCR NEGATIVE NEGATIVE Final   Staphylococcus aureus NEGATIVE NEGATIVE Final    Comment: (NOTE) The Xpert SA Assay (FDA approved for NASAL specimens in patients 70 years of age and older), is one component of a comprehensive surveillance program. It is not intended to diagnose infection nor to guide or monitor treatment. Performed at [facility], [address], [place], [place] [address]     Labs: CBC: Recent Labs  Lab 11/22/23 1753 11/23/23 0700 11/24/23 0459  WBC 8.1 7.4 4.8  HGB 11.4* 10.3* 9.1*  HCT 32.6* 29.4* 27.0*  MCV 99.4 99.3 102.3*  PLT 116* 105* 92*   Basic Metabolic Panel: Recent Labs  Lab 11/23/23 0515 11/24/23 0459 11/25/23 1254 11/26/23 0443 11/27/23 0509  NA 131* 129* 132* 133* 130*  K 3.7 3.7 4.0 3.7 3.7  CL 104 105 104 108 103  CO2 18* 19* 20* 21* 20*  GLUCOSE 81 105* 139* 105* 96  BUN 15 14 17 15 13   CREATININE 0.53 0.57 0.55 0.51 0.41*  CALCIUM 7.9* 7.6* 7.9* 7.7* 7.8*   Liver Function Tests: Recent Labs  Lab 11/25/23 1254 11/26/23 0443 11/27/23 0509  AST 46* 38 39  ALT 20 15 17   ALKPHOS 56 42 51  BILITOT 1.4* 1.5* 1.8*  PROT 4.2* 4.0* 4.3*  ALBUMIN  2.1* 2.3* 2.4*   CBG: No results for input(s): GLUCAP in the last 168 hours.  Discharge time spent: greater than 30 minutes.  Signed: Adriana DELENA Grams, MD [facility] 11/28/2023

## 2023-11-29 ENCOUNTER — Telehealth: Payer: Self-pay

## 2023-11-29 NOTE — Transitions of Care (Post Inpatient/ED Visit) (Signed)
   11/29/2023  Name: Lauren Lawrence MRN: [id number] DOB: 11-May-1940  Today's TOC FU Call Status: Today's TOC FU Call Status:: Unsuccessful Call (1st Attempt) Unsuccessful Call (1st Attempt) Date: 11/29/23  Attempted to reach the patient regarding the most recent Inpatient/ED visit.  Follow Up Plan: No further outreach attempts will be made at this time. We have been unable to contact the patient. Per daughter patient in [facility] Signature Julian Lemmings, LPN [facility] Nurse Health Advisor Direct Dial [phone]

## 2023-11-30 DIAGNOSIS — E039 Hypothyroidism, unspecified: Secondary | ICD-10-CM | POA: Diagnosis not present

## 2023-11-30 DIAGNOSIS — W19XXXD Unspecified fall, subsequent encounter: Secondary | ICD-10-CM | POA: Diagnosis not present

## 2023-11-30 DIAGNOSIS — S72141D Displaced intertrochanteric fracture of right femur, subsequent encounter for closed fracture with routine healing: Secondary | ICD-10-CM | POA: Diagnosis not present

## 2023-11-30 DIAGNOSIS — I10 Essential (primary) hypertension: Secondary | ICD-10-CM | POA: Diagnosis not present

## 2023-11-30 DIAGNOSIS — R600 Localized edema: Secondary | ICD-10-CM | POA: Diagnosis not present

## 2023-11-30 DIAGNOSIS — R748 Abnormal levels of other serum enzymes: Secondary | ICD-10-CM | POA: Diagnosis not present

## 2023-11-30 DIAGNOSIS — G309 Alzheimer's disease, unspecified: Secondary | ICD-10-CM | POA: Diagnosis not present

## 2023-11-30 DIAGNOSIS — D509 Iron deficiency anemia, unspecified: Secondary | ICD-10-CM | POA: Diagnosis not present

## 2023-11-30 DIAGNOSIS — F03A3 Unspecified dementia, mild, with mood disturbance: Secondary | ICD-10-CM | POA: Diagnosis not present

## 2023-12-02 DIAGNOSIS — E46 Unspecified protein-calorie malnutrition: Secondary | ICD-10-CM | POA: Diagnosis not present

## 2023-12-02 DIAGNOSIS — R600 Localized edema: Secondary | ICD-10-CM | POA: Diagnosis not present

## 2023-12-02 DIAGNOSIS — E039 Hypothyroidism, unspecified: Secondary | ICD-10-CM | POA: Diagnosis not present

## 2023-12-02 DIAGNOSIS — G309 Alzheimer's disease, unspecified: Secondary | ICD-10-CM | POA: Diagnosis not present

## 2023-12-02 DIAGNOSIS — F03A3 Unspecified dementia, mild, with mood disturbance: Secondary | ICD-10-CM | POA: Diagnosis not present

## 2023-12-02 DIAGNOSIS — I10 Essential (primary) hypertension: Secondary | ICD-10-CM | POA: Diagnosis not present

## 2023-12-02 DIAGNOSIS — R339 Retention of urine, unspecified: Secondary | ICD-10-CM | POA: Diagnosis not present

## 2023-12-02 DIAGNOSIS — W19XXXD Unspecified fall, subsequent encounter: Secondary | ICD-10-CM | POA: Diagnosis not present

## 2023-12-02 DIAGNOSIS — S72141D Displaced intertrochanteric fracture of right femur, subsequent encounter for closed fracture with routine healing: Secondary | ICD-10-CM | POA: Diagnosis not present

## 2023-12-05 ENCOUNTER — Ambulatory Visit: Admitting: Family Medicine

## 2023-12-05 DIAGNOSIS — S72001A Fracture of unspecified part of neck of right femur, initial encounter for closed fracture: Secondary | ICD-10-CM | POA: Diagnosis not present

## 2023-12-05 DIAGNOSIS — F32A Depression, unspecified: Secondary | ICD-10-CM | POA: Diagnosis not present

## 2023-12-05 DIAGNOSIS — G309 Alzheimer's disease, unspecified: Secondary | ICD-10-CM | POA: Diagnosis not present

## 2023-12-05 DIAGNOSIS — E039 Hypothyroidism, unspecified: Secondary | ICD-10-CM | POA: Diagnosis not present

## 2023-12-05 DIAGNOSIS — E785 Hyperlipidemia, unspecified: Secondary | ICD-10-CM | POA: Diagnosis not present

## 2023-12-05 DIAGNOSIS — R339 Retention of urine, unspecified: Secondary | ICD-10-CM | POA: Diagnosis not present

## 2023-12-05 DIAGNOSIS — R600 Localized edema: Secondary | ICD-10-CM | POA: Diagnosis not present

## 2023-12-05 DIAGNOSIS — I119 Hypertensive heart disease without heart failure: Secondary | ICD-10-CM | POA: Diagnosis not present

## 2023-12-07 DIAGNOSIS — K5900 Constipation, unspecified: Secondary | ICD-10-CM | POA: Diagnosis not present

## 2023-12-07 DIAGNOSIS — R339 Retention of urine, unspecified: Secondary | ICD-10-CM | POA: Diagnosis not present

## 2023-12-07 DIAGNOSIS — R600 Localized edema: Secondary | ICD-10-CM | POA: Diagnosis not present

## 2023-12-07 DIAGNOSIS — E871 Hypo-osmolality and hyponatremia: Secondary | ICD-10-CM | POA: Diagnosis not present

## 2023-12-07 DIAGNOSIS — E46 Unspecified protein-calorie malnutrition: Secondary | ICD-10-CM | POA: Diagnosis not present

## 2023-12-07 DIAGNOSIS — S72141D Displaced intertrochanteric fracture of right femur, subsequent encounter for closed fracture with routine healing: Secondary | ICD-10-CM | POA: Diagnosis not present

## 2023-12-07 DIAGNOSIS — F0390 Unspecified dementia without behavioral disturbance: Secondary | ICD-10-CM | POA: Diagnosis not present

## 2023-12-07 DIAGNOSIS — I10 Essential (primary) hypertension: Secondary | ICD-10-CM | POA: Diagnosis not present

## 2023-12-07 DIAGNOSIS — W19XXXD Unspecified fall, subsequent encounter: Secondary | ICD-10-CM | POA: Diagnosis not present

## 2023-12-08 ENCOUNTER — Emergency Department

## 2023-12-08 ENCOUNTER — Inpatient Hospital Stay
Admission: EM | Admit: 2023-12-08 | Discharge: 2023-12-13 | DRG: 442 | Disposition: A | Discharge status: Skilled Nursing Facility | Attending: Internal Medicine | Admitting: Internal Medicine

## 2023-12-08 ENCOUNTER — Other Ambulatory Visit: Payer: Self-pay

## 2023-12-08 ENCOUNTER — Inpatient Hospital Stay

## 2023-12-08 ENCOUNTER — Encounter: Payer: Self-pay | Admitting: Emergency Medicine

## 2023-12-08 DIAGNOSIS — I10 Essential (primary) hypertension: Secondary | ICD-10-CM | POA: Diagnosis not present

## 2023-12-08 DIAGNOSIS — D689 Coagulation defect, unspecified: Secondary | ICD-10-CM | POA: Diagnosis not present

## 2023-12-08 DIAGNOSIS — E039 Hypothyroidism, unspecified: Secondary | ICD-10-CM | POA: Diagnosis present

## 2023-12-08 DIAGNOSIS — R000 Tachycardia, unspecified: Secondary | ICD-10-CM | POA: Diagnosis not present

## 2023-12-08 DIAGNOSIS — Z79899 Other long term (current) drug therapy: Secondary | ICD-10-CM | POA: Diagnosis not present

## 2023-12-08 DIAGNOSIS — R400 Somnolence: Secondary | ICD-10-CM | POA: Diagnosis not present

## 2023-12-08 DIAGNOSIS — R140 Abdominal distension (gaseous): Secondary | ICD-10-CM | POA: Diagnosis not present

## 2023-12-08 DIAGNOSIS — B961 Klebsiella pneumoniae [K. pneumoniae] as the cause of diseases classified elsewhere: Secondary | ICD-10-CM | POA: Diagnosis not present

## 2023-12-08 DIAGNOSIS — E440 Moderate protein-calorie malnutrition: Secondary | ICD-10-CM | POA: Diagnosis present

## 2023-12-08 DIAGNOSIS — E43 Unspecified severe protein-calorie malnutrition: Secondary | ICD-10-CM | POA: Diagnosis not present

## 2023-12-08 DIAGNOSIS — Z6823 Body mass index (BMI) 23.0-23.9, adult: Secondary | ICD-10-CM

## 2023-12-08 DIAGNOSIS — E8809 Other disorders of plasma-protein metabolism, not elsewhere classified: Secondary | ICD-10-CM | POA: Diagnosis not present

## 2023-12-08 DIAGNOSIS — R339 Retention of urine, unspecified: Secondary | ICD-10-CM | POA: Diagnosis present

## 2023-12-08 DIAGNOSIS — Z79890 Hormone replacement therapy (postmenopausal): Secondary | ICD-10-CM

## 2023-12-08 DIAGNOSIS — W19XXXD Unspecified fall, subsequent encounter: Secondary | ICD-10-CM | POA: Diagnosis not present

## 2023-12-08 DIAGNOSIS — R17 Unspecified jaundice: Secondary | ICD-10-CM | POA: Diagnosis not present

## 2023-12-08 DIAGNOSIS — Z8249 Family history of ischemic heart disease and other diseases of the circulatory system: Secondary | ICD-10-CM

## 2023-12-08 DIAGNOSIS — N390 Urinary tract infection, site not specified: Secondary | ICD-10-CM | POA: Diagnosis present

## 2023-12-08 DIAGNOSIS — R4182 Altered mental status, unspecified: Secondary | ICD-10-CM | POA: Diagnosis not present

## 2023-12-08 DIAGNOSIS — E785 Hyperlipidemia, unspecified: Secondary | ICD-10-CM | POA: Diagnosis present

## 2023-12-08 DIAGNOSIS — Z66 Do not resuscitate: Secondary | ICD-10-CM | POA: Diagnosis not present

## 2023-12-08 DIAGNOSIS — F03A3 Unspecified dementia, mild, with mood disturbance: Secondary | ICD-10-CM | POA: Diagnosis not present

## 2023-12-08 DIAGNOSIS — R600 Localized edema: Secondary | ICD-10-CM | POA: Diagnosis not present

## 2023-12-08 DIAGNOSIS — K7682 Hepatic encephalopathy: Principal | ICD-10-CM | POA: Diagnosis present

## 2023-12-08 DIAGNOSIS — G8929 Other chronic pain: Secondary | ICD-10-CM | POA: Diagnosis present

## 2023-12-08 DIAGNOSIS — R262 Difficulty in walking, not elsewhere classified: Secondary | ICD-10-CM | POA: Diagnosis present

## 2023-12-08 DIAGNOSIS — R410 Disorientation, unspecified: Secondary | ICD-10-CM | POA: Diagnosis not present

## 2023-12-08 DIAGNOSIS — G9340 Encephalopathy, unspecified: Secondary | ICD-10-CM | POA: Diagnosis present

## 2023-12-08 DIAGNOSIS — S72141D Displaced intertrochanteric fracture of right femur, subsequent encounter for closed fracture with routine healing: Secondary | ICD-10-CM | POA: Diagnosis not present

## 2023-12-08 DIAGNOSIS — I6523 Occlusion and stenosis of bilateral carotid arteries: Secondary | ICD-10-CM | POA: Diagnosis not present

## 2023-12-08 DIAGNOSIS — E871 Hypo-osmolality and hyponatremia: Secondary | ICD-10-CM | POA: Diagnosis not present

## 2023-12-08 DIAGNOSIS — E8770 Fluid overload, unspecified: Secondary | ICD-10-CM | POA: Diagnosis present

## 2023-12-08 DIAGNOSIS — Z96642 Presence of left artificial hip joint: Secondary | ICD-10-CM | POA: Diagnosis present

## 2023-12-08 DIAGNOSIS — Z743 Need for continuous supervision: Secondary | ICD-10-CM | POA: Diagnosis not present

## 2023-12-08 DIAGNOSIS — R748 Abnormal levels of other serum enzymes: Secondary | ICD-10-CM | POA: Diagnosis not present

## 2023-12-08 DIAGNOSIS — R188 Other ascites: Secondary | ICD-10-CM | POA: Diagnosis not present

## 2023-12-08 DIAGNOSIS — K7460 Unspecified cirrhosis of liver: Secondary | ICD-10-CM | POA: Diagnosis not present

## 2023-12-08 DIAGNOSIS — F0390 Unspecified dementia without behavioral disturbance: Secondary | ICD-10-CM | POA: Diagnosis not present

## 2023-12-08 DIAGNOSIS — S79929A Unspecified injury of unspecified thigh, initial encounter: Secondary | ICD-10-CM | POA: Diagnosis not present

## 2023-12-08 DIAGNOSIS — E7800 Pure hypercholesterolemia, unspecified: Secondary | ICD-10-CM | POA: Diagnosis not present

## 2023-12-08 LAB — COMPREHENSIVE METABOLIC PANEL WITH GFR
ALT: 26 U/L (ref 0–44)
AST: 45 U/L — ABNORMAL HIGH (ref 15–41)
Albumin: 2.4 g/dL — ABNORMAL LOW (ref 3.5–5.0)
Alkaline Phosphatase: 95 U/L (ref 38–126)
Anion gap: 9 (ref 5–15)
BUN: 15 mg/dL (ref 8–23)
CO2: 22 mmol/L (ref 22–32)
Calcium: 8.1 mg/dL — ABNORMAL LOW (ref 8.9–10.3)
Chloride: 97 mmol/L — ABNORMAL LOW (ref 98–111)
Creatinine, Ser: 0.6 mg/dL (ref 0.44–1.00)
GFR, Estimated: >60 mL/min (ref >60)
Glucose, Bld: 99 mg/dL (ref 70–99)
Potassium: 3.7 mmol/L (ref 3.5–5.1)
Sodium: 128 mmol/L — ABNORMAL LOW (ref 135–145)
Total Bilirubin: 2.4 mg/dL — ABNORMAL HIGH (ref 0.0–1.2)
Total Protein: 4.8 g/dL — ABNORMAL LOW (ref 6.5–8.1)

## 2023-12-08 LAB — CBC
HCT: 30.3 % — ABNORMAL LOW (ref 36.0–46.0)
Hemoglobin: 10.6 g/dL — ABNORMAL LOW (ref 12.0–15.0)
MCH: 37.5 pg — ABNORMAL HIGH (ref 26.0–34.0)
MCHC: 35 g/dL (ref 30.0–36.0)
MCV: 107.1 fL — ABNORMAL HIGH (ref 80.0–100.0)
Platelets: 196 K/uL (ref 150–400)
RBC: 2.83 MIL/uL — ABNORMAL LOW (ref 3.87–5.11)
RDW: 18.9 % — ABNORMAL HIGH (ref 11.5–15.5)
WBC: 7 K/uL (ref 4.0–10.5)
nRBC: 0 % (ref 0.0–0.2)

## 2023-12-08 LAB — AMMONIA: Ammonia: 55 umol/L — ABNORMAL HIGH (ref 9–35)

## 2023-12-08 LAB — T4, FREE: Free T4: 1.13 ng/dL — ABNORMAL HIGH (ref 0.61–1.12)

## 2023-12-08 LAB — URINALYSIS, ROUTINE W REFLEX MICROSCOPIC
Bilirubin Urine: NEGATIVE
Glucose, UA: NEGATIVE mg/dL
Ketones, ur: NEGATIVE mg/dL
Nitrite: NEGATIVE
Protein, ur: NEGATIVE mg/dL
RBC / HPF: 0 RBC/hpf (ref 0–5)
Specific Gravity, Urine: 1.009 (ref 1.005–1.030)
WBC, UA: >50 WBC/hpf (ref 0–5)
pH: 7 (ref 5.0–8.0)

## 2023-12-08 LAB — TSH: TSH: 52 u[IU]/mL — ABNORMAL HIGH (ref 0.350–4.500)

## 2023-12-08 LAB — BRAIN NATRIURETIC PEPTIDE: B Natriuretic Peptide: 66.7 pg/mL (ref 0.0–100.0)

## 2023-12-08 LAB — CK: Total CK: 106 U/L (ref 38–234)

## 2023-12-08 LAB — PROTIME-INR
INR: 1.4 — ABNORMAL HIGH (ref 0.8–1.2)
Prothrombin Time: 17.9 s — ABNORMAL HIGH (ref 11.4–15.2)

## 2023-12-08 LAB — LACTIC ACID, PLASMA
Lactic Acid, Venous: 1.8 mmol/L (ref 0.5–1.9)
Lactic Acid, Venous: 1.6 mmol/L (ref 0.5–1.9)

## 2023-12-08 MED ORDER — PHYTONADIONE 5 MG PO TABS
2.5000 mg | ORAL_TABLET | Freq: Once | ORAL | Status: AC
  Administered 2023-12-08: 2.5 mg via ORAL
  Filled 2023-12-08: qty 1

## 2023-12-08 MED ORDER — SENNOSIDES-DOCUSATE SODIUM 8.6-50 MG PO TABS: 1.0000 | ORAL_TABLET | Freq: Every evening | ORAL | Status: DC | PRN

## 2023-12-08 MED ORDER — TAMSULOSIN HCL 0.4 MG PO CAPS
0.4000 mg | ORAL_CAPSULE | Freq: Every day | ORAL | Status: DC
  Administered 2023-12-09 – 2023-12-12 (×4): 0.4 mg via ORAL
  Filled 2023-12-08 (×4): qty 1

## 2023-12-08 MED ORDER — SODIUM CHLORIDE 0.9 % IV SOLN
1.0000 g | INTRAVENOUS | Status: AC
  Administered 2023-12-08: 1 g via INTRAVENOUS
  Filled 2023-12-08: qty 10

## 2023-12-08 MED ORDER — ENSURE PLUS HIGH PROTEIN PO LIQD: 237.0000 mL | Freq: Two times a day (BID) | ORAL | Status: DC

## 2023-12-08 MED ORDER — FUROSEMIDE 10 MG/ML IJ SOLN
40.0000 mg | Freq: Two times a day (BID) | INTRAMUSCULAR | Status: DC
  Administered 2023-12-08 – 2023-12-13 (×10): 40 mg via INTRAVENOUS
  Filled 2023-12-08 (×10): qty 4

## 2023-12-08 MED ORDER — ALBUMIN HUMAN 25 % IV SOLN
25.0000 g | Freq: Four times a day (QID) | INTRAVENOUS | Status: AC
  Administered 2023-12-08 – 2023-12-09 (×4): 25 g via INTRAVENOUS
  Filled 2023-12-08 (×5): qty 100

## 2023-12-08 MED ORDER — RISEDRONATE SODIUM 5 MG PO TABS: 35.0000 mg | ORAL_TABLET | ORAL | Status: DC

## 2023-12-08 MED ORDER — ONDANSETRON HCL 4 MG/2ML IJ SOLN: 4.0000 mg | Freq: Four times a day (QID) | INTRAMUSCULAR | Status: DC | PRN

## 2023-12-08 MED ORDER — ENOXAPARIN SODIUM 40 MG/0.4ML IJ SOSY
40.0000 mg | PREFILLED_SYRINGE | INTRAMUSCULAR | Status: DC
  Administered 2023-12-08 – 2023-12-12 (×5): 40 mg via SUBCUTANEOUS
  Filled 2023-12-08 (×5): qty 0.4

## 2023-12-08 MED ORDER — SPIRONOLACTONE 25 MG PO TABS
50.0000 mg | ORAL_TABLET | Freq: Every day | ORAL | Status: DC
  Administered 2023-12-08 – 2023-12-13 (×5): 50 mg via ORAL
  Filled 2023-12-08 (×5): qty 2

## 2023-12-08 MED ORDER — SODIUM CHLORIDE 0.9 % IV SOLN
1.0000 g | INTRAVENOUS | Status: AC
  Administered 2023-12-09 – 2023-12-12 (×4): 1 g via INTRAVENOUS
  Filled 2023-12-08 (×4): qty 10

## 2023-12-08 MED ORDER — METOPROLOL SUCCINATE ER 50 MG PO TB24
100.0000 mg | ORAL_TABLET | Freq: Every day | ORAL | Status: DC
  Administered 2023-12-10 – 2023-12-13 (×4): 100 mg via ORAL
  Filled 2023-12-08 (×4): qty 2

## 2023-12-08 MED ORDER — LEVOTHYROXINE SODIUM 100 MCG PO TABS
100.0000 ug | ORAL_TABLET | Freq: Every day | ORAL | Status: DC
  Administered 2023-12-10 – 2023-12-13 (×4): 100 ug via ORAL
  Filled 2023-12-08 (×5): qty 1

## 2023-12-08 MED ORDER — MEGESTROL ACETATE 400 MG/10ML PO SUSP
400.0000 mg | Freq: Every day | ORAL | Status: DC
  Filled 2023-12-08: qty 10

## 2023-12-08 MED ORDER — ONDANSETRON HCL 4 MG PO TABS: 4.0000 mg | ORAL_TABLET | Freq: Four times a day (QID) | ORAL | Status: DC | PRN

## 2023-12-08 MED ORDER — ACETAMINOPHEN 325 MG PO TABS: 650.0000 mg | ORAL_TABLET | ORAL | Status: DC | PRN

## 2023-12-08 MED ORDER — LACTULOSE 10 GM/15ML PO SOLN
30.0000 g | Freq: Every day | ORAL | Status: DC
  Administered 2023-12-08: 30 g via ORAL
  Filled 2023-12-08: qty 60

## 2023-12-08 NOTE — ED Notes (Signed)
Hospitalist at bedside. Daughter at bedside.

## 2023-12-08 NOTE — ED Provider Notes (Signed)
 [facility] Provider Note    Event Date/Time   First MD Initiated Contact with Patient 12/08/23 1205     (approximate)   History   Weakness  EM caveat: Confusion/altered mental status  HPI  Lauren Lawrence is a 83 y.o. female daughter provides majority of history  Has had progressive increasing weakness and confusion developing over the last week.  In particular fairly weak and nonambulatory today but having increasing fatigue difficulty participating in physical therapy and seems to be slowly developing confusion and decrease in mental status.   Patient was discharged August 11 has a history of displaced right femur fracture.  Hypothyroidism hyponatremia transaminitis hypertension chronic back pain and severe malnutrition.  Underwent surgical repair of right hip fracture  Physical Exam   Triage Vital Signs: ED Triage Vitals  Encounter Vitals Group     BP 12/08/23 1152 (!) 130/56     Girls Systolic BP Percentile --      Girls Diastolic BP Percentile --      Boys Systolic BP Percentile --      Boys Diastolic BP Percentile --      Pulse Rate 12/08/23 1152 97     Resp 12/08/23 1152 18     Temp 12/08/23 1152 99.1 F (37.3 C)     Temp Source 12/08/23 1152 Axillary     SpO2 12/08/23 1152 98 %     Weight 12/08/23 1158 113 lb (51.3 kg)     Height 12/08/23 1158 5' 4 (1.626 m)     Head Circumference --      Peak Flow --      Pain Score --      Pain Loc --      Pain Education --      Exclude from Growth Chart --     Most recent vital signs: Vitals:   12/08/23 1152  BP: (!) 130/56  Pulse: 97  Resp: 18  Temp: 99.1 F (37.3 C)  SpO2: 98%     General: Awake, no distress.  She is confused does not recognize her daughter.  Daughter reports that typically she would be able to converse recognize her  No obvious facial droop.  Appears in no acute distress or extremis but does appear fatigued.  Alert though maintains eye contact, not able to identify  who her daughter is.  CV:  Good peripheral perfusion.  Normal tones and rate Resp:  Normal effort.  Clear bilateral with normal work of breathing Abd:  No distention.  Soft nontender nondistended.  No bruising. Other:  Right lateral hip incision clean dry intact surrounding ecchymosis but no purulence or warmth.  Otherwise no noted wounds or injuries on the extremities.   Moderate to severe bilateral lower extremity pitting edema.   ED Results / Procedures / Treatments   Labs (all labs ordered are listed, but only abnormal results are displayed) Labs Reviewed  CBC - Abnormal; Notable for the following components:      Result Value   RBC 2.83 (*)    Hemoglobin 10.6 (*)    HCT 30.3 (*)    MCV 107.1 (*)    MCH 37.5 (*)    RDW 18.9 (*)    All other components within normal limits  COMPREHENSIVE METABOLIC PANEL WITH GFR - Abnormal; Notable for the following components:   Sodium 128 (*)    Chloride 97 (*)    Calcium 8.1 (*)    Total Protein 4.8 (*)  Albumin  2.4 (*)    AST 45 (*)    Total Bilirubin 2.4 (*)    All other components within normal limits  AMMONIA - Abnormal; Notable for the following components:   Ammonia 55 (*)    All other components within normal limits  TSH - Abnormal; Notable for the following components:   TSH 52.000 (*)    All other components within normal limits  PROTIME-INR - Abnormal; Notable for the following components:   Prothrombin Time 17.9 (*)    INR 1.4 (*)    All other components within normal limits  URINALYSIS, ROUTINE W REFLEX MICROSCOPIC - Abnormal; Notable for the following components:   Color, Urine AMBER (*)    APPearance CLOUDY (*)    Hgb urine dipstick SMALL (*)    Leukocytes,Ua LARGE (*)    Bacteria, UA MANY (*)    All other components within normal limits  CULTURE, BLOOD (ROUTINE X 2)  CULTURE, BLOOD (ROUTINE X 2)  URINE CULTURE  LACTIC ACID, PLASMA  CK  BRAIN NATRIURETIC PEPTIDE  LACTIC ACID, PLASMA  T4, FREE  CBG  MONITORING, ED     EKG  And interpreted by me at 1420 heart rate 98 QRS 90 QTc 420 Normal sinus rhythm no evidence of acute ischemia.  Slight artifact   RADIOLOGY  CT head inter by me is grossly negative for acute hemorrhage  CT Head Wo Contrast Result Date: 12/08/2023 CLINICAL DATA:  Delirium EXAM: CT HEAD WITHOUT CONTRAST TECHNIQUE: Contiguous axial images were obtained from the base of the skull through the vertex without intravenous contrast. RADIATION DOSE REDUCTION: This exam was performed according to the departmental dose-optimization program which includes automated exposure control, adjustment of the mA and/or kV according to patient size and/or use of iterative reconstruction technique. COMPARISON:  October 14, 2022 FINDINGS: Brain: Proportional prominence of the ventricles and sulci, consistent with diffuse cerebral parenchymal volume loss. The ventricles otherwise maintained midline position without midline shift. Gray-white differentiation is preserved.Scattered periventricular white matter hypoattenuation, most consistent with changes of mild chronic ischemic microvascular disease.No evidence of acute territorial infarction, extra-axial fluid collection, hemorrhage, or mass lesion. The basilar cisterns are patent without downward herniation. The cerebellar hemispheres and vermis are well formed without mass lesion or focal attenuation abnormality. Vascular: No hyperdense vessel. Calcified atherosclerotic plaque within the cavernous/supraclinoid internal carotid arteries. Skull: Normal. Negative for fracture or focal lesion. Sinuses/Orbits: Minimal layering fluid in the right sphenoid sinus with bilateral mastoid effusions.The globes appear intact. No retrobulbar hematoma. Other: None. IMPRESSION: 1. No acute intracranial abnormality, specifically, no acute hemorrhage, territorial infarction, or intracranial mass. 2. Minimal layering fluid in the right sphenoid sinus with bilateral mastoid  effusions. This could reflect changes of early or developing acute sinusitis. Clinical correlation requested. 3. Mild cerebral volume loss with sequelae of mild chronic ischemic microvascular disease. Electronically Signed   By: Rogelia Myers M.D.   On: 12/08/2023 14:04   DG Chest Port 1 View Result Date: 12/08/2023 CLINICAL DATA:  Possible sepsis.  Tachycardia. EXAM: PORTABLE CHEST 1 VIEW COMPARISON:  11/20/2023 FINDINGS: Poor inspiration. Allowing for that, the heart and mediastinal shadows are normal in the lungs are probably clear. No edema or effusion. IMPRESSION: Poor inspiration. No active disease suspected. Electronically Signed   By: Oneil Officer M.D.   On: 12/08/2023 13:07         PROCEDURES:  Critical Care performed: No  Procedures   MEDICATIONS ORDERED IN ED: Medications  cefTRIAXone  (ROCEPHIN ) 1 g in sodium  chloride 0.9 % 100 mL IVPB (1 g Intravenous New Bag/Given 12/08/23 1443)     IMPRESSION / MDM / ASSESSMENT AND PLAN / ED COURSE  I reviewed the triage vital signs and the nursing notes.                              Differential diagnosis includes, but is not limited to, insidious but slowly worsening confusion and weakness developing over the last several days.  Does not appear that there is anyone discernible inciting event.  Seems to be a little more insidious.  Metabolic causes dehydration electrolyte abnormalities with her history of some liver disease hyperammonemia hypoalbuminemia, volume overload, etc. also strongly considered.  No obvious focal localizing neurologic symptoms.  Urinalysis demonstrative of findings concerning for potential UTI and there is sediment in the catheter.  Sent for culture but given her worsening mental status we will initiate treatment for potential UTI with Rocephin .  No obvious acute chest or abdominal abnormalities.  Significant peripheral edema but patient family relates significant chronicity of this and was recently attempted  diuresis without improvement at her care facility    Patient's presentation is most consistent with acute complicated illness / injury requiring diagnostic workup.  Patient appears to have a complicated presentation.  Findings today concerning for potential urinary    ----------------------------------------- 3:06 PM on 12/08/2023 ----------------------------------------- Patient's daughter agreeable with plan for admission.  At this juncture admitting to the hospitalist service for what I think is potentially complicated or multifactorial cause for weakness and alteration from baseline mental status including causes such as potential urinary tract infection, hypothyroidism, malnutrition, deconditioning, etc.  Established history of hypothyroidism currently on Synthroid , suspected adjustment upwards of this may be needed but at this time no hypotension no hypothermia no signs that would clearly indicate myxedema coma especially in the setting of what I suspect is also a concomitant urinary tract infection.  Patient accepted to hospitalist by Dr. Laurita     FINAL CLINICAL IMPRESSION(S) / ED DIAGNOSES   Final diagnoses:  Urinary tract infection, acute  Hypothyroidism, unspecified type  Chronic hyponatremia  Delirium     Rx / DC Orders   ED Discharge Orders     None        Note:  This document was prepared using Dragon voice recognition software and may include unintentional dictation errors.   Dicky Anes, MD 12/08/23 [phone]

## 2023-12-08 NOTE — H&P (Addendum)
 History and Physical    Lauren Lawrence FMW:[id number] DOB: October 29, 1940 DOA: 12/08/2023  PCP: [name], MD (Confirm with patient/family/NH records and if not entered, this has to be entered at [facility] point of entry) Patient coming from: Rehab  I have personally briefly reviewed patient's old medical records in [facility]  Chief Complaint: AMS, worsening of leg swelling and low sodium level  HPI: Lauren Lawrence is a 83 y.o. female with medical history significant of HTN, HLD, frequent falls with recent hip fracture status post ORIF, cirrhosis, memory loss, moderate to severe protein calorie malnutrition, acute urinary retention on Foley catheter, presented with worsening of confusion, anasarca and hyponatremia.  Patient is confused and unable to provide any history, all history provided by daughter at bedside.  Patient was recently hospitalized after a fall and hip fracture 2 weeks - 10 days ago, patient received full resuscitation during last hospitalization, her weight has significant increased from baseline 95 pounds to 117 pounds in the microblade week.  Subsequently she was discharged to rehab center.  She continued to suffer from anasarca, and eventually she was started on Lasix  3 days ago.  However family only saw a modest decrease of weight from 117 pound to 113 pounds.  Moreover, during last admission patient developed acute urinary retention and a Foley was placed in.  Earlier this week, patient did void trial however she failed and another Foley was placed in.  Family visited patient on Monday and found patient very sleepy and more confused compared to her baseline.  But denied there was not any diarrhea cough or fever or chills.  In addition, daughter also reported the patient appeared to have significant deconditioning and pain associated with any movement resulted in the severe decrease of daily activity and PT time. ED Course: Temperature 99.1 pulse 90 blood pressure 130/56 O2  saturation 98% room air.  CT head negative for acute findings.  Blood work showed WBC 7.0 hemoglobin 10.6, platelet 196, sodium 128, potassium 3.7, BUN 15 creatinine 0.6 albumin  2.4 AST 45 ALT 26 total bilirubin 2.4.  UA showed WBCs 3+, RBC 1+.  Ammonium level 55.  Patient was given ceftriaxone  x 1 in the ED.  Review of Systems: Unable to perform, patient is confused.  Past Medical History:  Diagnosis Date   Depression    Hyperlipidemia    Hypertension    Memory loss     Past Surgical History:  Procedure Laterality Date   CATARACT EXTRACTION  03/2020   HIP ARTHROPLASTY Left 10/14/2022   Procedure: ARTHROPLASTY BIPOLAR HIP (HEMIARTHROPLASTY);  Surgeon: Margrette Taft BRAVO, MD;  Location: [facility];  Service: Orthopedics;  Laterality: Left;   INTRAMEDULLARY (IM) NAIL INTERTROCHANTERIC Right 11/22/2023   Procedure: FIXATION, FRACTURE, INTERTROCHANTERIC, WITH INTRAMEDULLARY ROD;  Surgeon: Margrette Taft BRAVO, MD;  Location: [facility];  Service: Orthopedics;  Laterality: Right;     reports that she has never smoked. She has never used smokeless tobacco. She reports that she does not drink alcohol and does not use drugs.  Allergies  Allergen Reactions   Cat Dander Other (See Comments)    Sinus issues    Family History  Problem Relation Age of Onset   Heart disease Mother    Breast cancer Mother 66   Dementia Father    Hypertension Father    Heart disease Father    Gout Father    Heart disease Brother        MI with CABG X3   Breast  cancer Maternal Aunt 80     Prior to Admission medications   Medication Sig Start Date End Date Taking? Authorizing Provider  acetaminophen  (TYLENOL ) 325 MG tablet Take 650 mg by mouth every 4 (four) hours as needed. General discomfort   Yes [provider]  Coenzyme Q10 10 MG capsule Take 10 mg by mouth daily.   Yes [provider]  docusate sodium  (COLACE) 100 MG capsule Take 1 capsule (100 mg total) by mouth 2 (two) times daily.  11/28/23  Yes Shahmehdi, Seyed A, MD  enoxaparin  (LOVENOX ) 30 MG/0.3ML injection Inject 0.3 mLs (30 mg total) into the skin daily for 14 days. 11/28/23 12/12/23 Yes Shahmehdi, Seyed A, MD  furosemide  (LASIX ) 40 MG tablet Take 40 mg by mouth daily.   Yes [provider]  levothyroxine  (SYNTHROID ) 100 MCG tablet Take 1 tablet (100 mcg total) by mouth daily. 09/05/23  Yes Simmons-Robinson, Makiera, MD  lidocaine  4 % Place 1 patch onto the skin every 12 (twelve) hours. Lower back   Yes [provider]  megestrol  (MEGACE ) 400 MG/10ML suspension Take 10 mLs (400 mg total) by mouth daily. 11/28/23  Yes Shahmehdi, Adriana LABOR, MD  metoprolol  succinate (TOPROL -XL) 100 MG 24 hr tablet Take 1 tablet (100 mg total) by mouth daily. Take with or immediately following a meal. 06/01/23  Yes Fisher, Nancyann BRAVO, MD  Multiple Vitamin (MULTIVITAMIN WITH MINERALS) TABS tablet Take 1 tablet by mouth daily.   Yes [provider]  ondansetron  (ZOFRAN ) 4 MG tablet Take 1 tablet (4 mg total) by mouth every 6 (six) hours as needed for nausea. 11/28/23  Yes Shahmehdi, Seyed A, MD  phenol (CHLORASEPTIC) 1.4 % LIQD Use as directed 1 spray in the mouth or throat as needed for throat irritation / pain. 11/28/23  Yes Shahmehdi, Adriana LABOR, MD  risedronate  (ACTONEL ) 35 MG tablet Take 1 tablet (35 mg total) by mouth every 7 (seven) days. with water on empty stomach, nothing by mouth or lie down for next 30 minutes. 11/19/22  Yes [name], MD  sodium chloride  1 g tablet Take 1 g by mouth daily. For 7 days   Yes [provider]  tamsulosin  (FLOMAX ) 0.4 MG CAPS capsule Take 1 capsule (0.4 mg total) by mouth daily. 11/28/23  Yes Shahmehdi, Adriana LABOR, MD  feeding supplement (ENSURE PLUS HIGH PROTEIN) LIQD Take 237 mLs by mouth 2 (two) times daily between meals. 11/28/23 12/28/23  ShahmehdiAdriana LABOR, MD  Ferrous Sulfate  (IRON ) 325 (65 Fe) MG TABS Take 1 tablet (325 mg total) by mouth daily. Patient not taking: Reported on  12/08/2023 10/19/22   Pearlean Manus, MD    Physical Exam: Vitals:   12/08/23 1400 12/08/23 1430 12/08/23 1500 12/08/23 1530  BP:      Pulse: 87 88 94 93  Resp: 15 14 15 17   Temp:      TempSrc:      SpO2:      Weight:      Height:        Constitutional: NAD, calm, comfortable Vitals:   12/08/23 1400 12/08/23 1430 12/08/23 1500 12/08/23 1530  BP:      Pulse: 87 88 94 93  Resp: 15 14 15 17   Temp:      TempSrc:      SpO2:      Weight:      Height:       Eyes: PERRL, lids and conjunctivae normal ENMT: Mucous membranes are moist. Posterior pharynx clear  of any exudate or lesions.Normal dentition.  Neck: normal, supple, no masses, no thyromegaly Respiratory: clear to auscultation bilaterally, no wheezing, no crackles. Normal respiratory effort. No accessory muscle use.  Cardiovascular: Regular rate and rhythm, no murmurs / rubs / gallops.  Anasarca. 2+ pedal pulses. No carotid bruits.  Abdomen: Positive ascites signs, no tenderness, no masses palpated. No hepatosplenomegaly. Bowel sounds positive.  Musculoskeletal: no clubbing / cyanosis. No joint deformity upper and lower extremities. Good ROM, no contractures. Normal muscle tone.  Skin: no rashes, lesions, ulcers. No induration Neurologic: CN 2-12 grossly intact. Sensation intact, DTR normal. Strength 5/5 in all 4.  Psychiatric: Normal judgment and insight. Alert and oriented x 3. Normal mood.     Labs on Admission: I have personally reviewed following labs and imaging studies  CBC: Recent Labs  Lab 12/08/23 1300  WBC 7.0  HGB 10.6*  HCT 30.3*  MCV 107.1*  PLT 196   Basic Metabolic Panel: Recent Labs  Lab 12/08/23 1300  NA 128*  K 3.7  CL 97*  CO2 22  GLUCOSE 99  BUN 15  CREATININE 0.60  CALCIUM 8.1*   GFR: Estimated Creatinine Clearance: 43.9 mL/min (by C-G formula based on SCr of 0.6 mg/dL). Liver Function Tests: Recent Labs  Lab 12/08/23 1300  AST 45*  ALT 26  ALKPHOS 95  BILITOT 2.4*  PROT  4.8*  ALBUMIN  2.4*   No results for input(s): LIPASE, AMYLASE in the last 168 hours. Recent Labs  Lab 12/08/23 1300  AMMONIA 55*   Coagulation Profile: Recent Labs  Lab 12/08/23 1300  INR 1.4*   Cardiac Enzymes: Recent Labs  Lab 12/08/23 1300  CKTOTAL 106   BNP (last 3 results) No results for input(s): PROBNP in the last 8760 hours. HbA1C: No results for input(s): HGBA1C in the last 72 hours. CBG: No results for input(s): GLUCAP in the last 168 hours. Lipid Profile: No results for input(s): CHOL, HDL, LDLCALC, TRIG, CHOLHDL, LDLDIRECT in the last 72 hours. Thyroid  Function Tests: Recent Labs    12/08/23 1300  TSH 52.000*   Anemia Panel: No results for input(s): VITAMINB12, FOLATE, FERRITIN, TIBC, IRON , RETICCTPCT in the last 72 hours. Urine analysis:    Component Value Date/Time   COLORURINE AMBER (A) 12/08/2023 1319   APPEARANCEUR CLOUDY (A) 12/08/2023 1319   LABSPEC 1.009 12/08/2023 1319   PHURINE 7.0 12/08/2023 1319   GLUCOSEU NEGATIVE 12/08/2023 1319   HGBUR SMALL (A) 12/08/2023 1319   BILIRUBINUR NEGATIVE 12/08/2023 1319   KETONESUR NEGATIVE 12/08/2023 1319   PROTEINUR NEGATIVE 12/08/2023 1319   NITRITE NEGATIVE 12/08/2023 1319   LEUKOCYTESUR LARGE (A) 12/08/2023 1319    Radiological Exams on Admission: CT Head Wo Contrast Result Date: 12/08/2023 CLINICAL DATA:  Delirium EXAM: CT HEAD WITHOUT CONTRAST TECHNIQUE: Contiguous axial images were obtained from the base of the skull through the vertex without intravenous contrast. RADIATION DOSE REDUCTION: This exam was performed according to the departmental dose-optimization program which includes automated exposure control, adjustment of the mA and/or kV according to patient size and/or use of iterative reconstruction technique. COMPARISON:  October 14, 2022 FINDINGS: Brain: Proportional prominence of the ventricles and sulci, consistent with diffuse cerebral parenchymal volume  loss. The ventricles otherwise maintained midline position without midline shift. Gray-white differentiation is preserved.Scattered periventricular white matter hypoattenuation, most consistent with changes of mild chronic ischemic microvascular disease.No evidence of acute territorial infarction, extra-axial fluid collection, hemorrhage, or mass lesion. The basilar cisterns are patent without downward herniation. The cerebellar hemispheres and  vermis are well formed without mass lesion or focal attenuation abnormality. Vascular: No hyperdense vessel. Calcified atherosclerotic plaque within the cavernous/supraclinoid internal carotid arteries. Skull: Normal. Negative for fracture or focal lesion. Sinuses/Orbits: Minimal layering fluid in the right sphenoid sinus with bilateral mastoid effusions.The globes appear intact. No retrobulbar hematoma. Other: None. IMPRESSION: 1. No acute intracranial abnormality, specifically, no acute hemorrhage, territorial infarction, or intracranial mass. 2. Minimal layering fluid in the right sphenoid sinus with bilateral mastoid effusions. This could reflect changes of early or developing acute sinusitis. Clinical correlation requested. 3. Mild cerebral volume loss with sequelae of mild chronic ischemic microvascular disease. Electronically Signed   By: Rogelia Myers M.D.   On: 12/08/2023 14:04   DG Chest Port 1 View Result Date: 12/08/2023 CLINICAL DATA:  Possible sepsis.  Tachycardia. EXAM: PORTABLE CHEST 1 VIEW COMPARISON:  11/20/2023 FINDINGS: Poor inspiration. Allowing for that, the heart and mediastinal shadows are normal in the lungs are probably clear. No edema or effusion. IMPRESSION: Poor inspiration. No active disease suspected. Electronically Signed   By: Oneil Officer M.D.   On: 12/08/2023 13:07    EKG: Independently reviewed.  Sinus rhythm, no acute ST changes.  Assessment/Plan Principal Problem:   Encephalopathy Active Problems:   Acute encephalopathy    Cirrhosis (HCC)   UTI (urinary tract infection)  (please populate well all problems here in Problem List. (For example, if patient is on BP meds at home and you resume or decide to hold them, it is a problem that needs to be her. Same for CAD, COPD, HLD and so on)  Acute hepatic encephalopathy Acute metabolic encephalopathy - Probably multifactorial, mentation changes likely related to elevated ammonia level as well as concurrent UTI. - Start lactulose , given her frailty, will try to start low-dose of lactulose  once a day and titrate up if tolerated - Ceftriaxone  to treat UTI - Other DDx, will also order ascites ultrasound and consider paracentesis to rule out SBP if sufficient ascites found. -Other Ddx, clinically suspicion for myxedema coma is low as patient has been taking Synthroid .  More likely she has a sick euthyroid syndrome. Will check T4.  Acute on chronic cirrhosis decompensation Anasarca, failed outpatient management Worsening of coagulopathy chronic hypoalbuminemia -IV Lasix  twice daily - Albumin  every 6 hours x 4 doses to post IV diuresis -Add Aldactone  -VitK x1 and recheck INR tomorrow  Hypothyroidism - Significant elevation of TSH, check T4 level - Further workup as above.  Indwelling Foley catheter - Failed outpatient void trial on Monday and the new Foley was inserted same day. - Continue Flomax   Hyponatremia, chronic - Volume overloaded - Secondary to cirrhosis decompensation, IV diuresis as above.  Moderate to severe protein calorie malnutrition Anorexia - BMI<19, and patient was started on megestrol  by rehab center.  Acute ambulation impairment Hip fracture status post ORIF - PT evaluation  Total time spent on patient care 75 minutes.  DVT prophylaxis: Lovenox  Code Status: DNR/DNI Family Communication: Daughter at bedside Disposition Plan: Patient is sick with acute cirrhosis decompensation with anasarca requiring IV diuresis, and complicated with  hepatic encephalopathy as well as complicated UTI, requiring IV antibiotics, expect more than 2 midnight hospital stay. Consults called: None Admission status: Telemetry admission   Cort ONEIDA Mana MD [facility] Pager [phone]  12/08/2023, 3:44 PM

## 2023-12-08 NOTE — Progress Notes (Signed)
 PHARMACIST - PHYSICIAN COMMUNICATION  CONCERNING: P&T Medication Policy Regarding Oral Bisphosphonates  RECOMMENDATION: Your order for alendronate (Fosamax), ibandronate (Boniva), or risedronate  (Actonel ) has been discontinued at this time.  If the patient's post-hospital medical condition warrants safe use of this class of drugs, please resume the pre-hospital regimen upon discharge.  DESCRIPTION:  Alendronate (Fosamax), ibandronate (Boniva), and risedronate  (Actonel ) can cause severe esophageal erosions in patients who are unable to remain upright at least 30 minutes after taking this medication.   Since brief interruptions in therapy are thought to have minimal impact on bone mineral density, the Pharmacy & Therapeutics Committee has established that bisphosphonate orders should be routinely discontinued during hospitalization.   To override this safety policy and permit administration of Boniva, Fosamax, or Actonel  in the hospital, prescribers must write "DO NOT HOLD" in the comments section when placing the order for this class of medications.  Estill CHRISTELLA Lutes, PharmD, BCPS Clinical Pharmacist 12/08/2023 4:30 PM

## 2023-12-09 DIAGNOSIS — K7682 Hepatic encephalopathy: Secondary | ICD-10-CM | POA: Diagnosis not present

## 2023-12-09 LAB — COMPREHENSIVE METABOLIC PANEL WITH GFR
ALT: 25 U/L (ref 0–44)
AST: 41 U/L (ref 15–41)
Albumin: 3.4 g/dL — ABNORMAL LOW (ref 3.5–5.0)
Alkaline Phosphatase: 81 U/L (ref 38–126)
Anion gap: 11 (ref 5–15)
BUN: 15 mg/dL (ref 8–23)
CO2: 21 mmol/L — ABNORMAL LOW (ref 22–32)
Calcium: 8.4 mg/dL — ABNORMAL LOW (ref 8.9–10.3)
Chloride: 97 mmol/L — ABNORMAL LOW (ref 98–111)
Creatinine, Ser: 0.61 mg/dL (ref 0.44–1.00)
GFR, Estimated: >60 mL/min (ref >60)
Glucose, Bld: 89 mg/dL (ref 70–99)
Potassium: 3.3 mmol/L — ABNORMAL LOW (ref 3.5–5.1)
Sodium: 129 mmol/L — ABNORMAL LOW (ref 135–145)
Total Bilirubin: 2.7 mg/dL — ABNORMAL HIGH (ref 0.0–1.2)
Total Protein: 5.4 g/dL — ABNORMAL LOW (ref 6.5–8.1)

## 2023-12-09 LAB — AMMONIA: Ammonia: 110 umol/L — ABNORMAL HIGH (ref 9–35)

## 2023-12-09 MED ORDER — LACTULOSE 10 GM/15ML PO SOLN
20.0000 g | Freq: Two times a day (BID) | ORAL | Status: DC
  Filled 2023-12-09: qty 30

## 2023-12-09 MED ORDER — LACTULOSE ENEMA
300.0000 mL | Freq: Every day | ORAL | Status: DC
  Administered 2023-12-09: 300 mL via RECTAL
  Filled 2023-12-09 (×2): qty 300

## 2023-12-09 MED ORDER — CHLORHEXIDINE GLUCONATE CLOTH 2 % EX PADS
6.0000 | MEDICATED_PAD | Freq: Every day | CUTANEOUS | Status: DC
  Administered 2023-12-09 – 2023-12-13 (×5): 6 via TOPICAL

## 2023-12-09 MED ORDER — ENSURE PLUS HIGH PROTEIN PO LIQD
237.0000 mL | Freq: Three times a day (TID) | ORAL | Status: DC
  Administered 2023-12-09 – 2023-12-13 (×10): 237 mL via ORAL

## 2023-12-09 MED ORDER — ADULT MULTIVITAMIN W/MINERALS CH
1.0000 | ORAL_TABLET | Freq: Every day | ORAL | Status: DC
  Administered 2023-12-10 – 2023-12-13 (×4): 1 via ORAL
  Filled 2023-12-09 (×4): qty 1

## 2023-12-09 MED ORDER — POTASSIUM CHLORIDE CRYS ER 20 MEQ PO TBCR
40.0000 meq | EXTENDED_RELEASE_TABLET | Freq: Once | ORAL | Status: DC
  Filled 2023-12-09: qty 2

## 2023-12-09 NOTE — Progress Notes (Signed)
 PT Cancellation Note  Patient Details Name: Lauren Lawrence MRN: [id number] DOB: [birth year]   Cancelled Treatment:    Reason Eval/Treat Not Completed: Fatigue/lethargy limiting ability to participate (Consult received and chart reviewed. Patient sleeping soundly upon arrival to room; unable to awaken and maintain alertness for meaningful participation with session.  Will continue efforts at later time/date as medically appropriate.)   [name] H. Delores, PT, DPT, NCS 12/09/23, 9:27 AM [phone]

## 2023-12-09 NOTE — TOC Initial Note (Addendum)
 Transition of Care [facility]) - Initial/Assessment Note    Patient Details  Name: Lauren Lawrence MRN: [id number] Date of Birth: 1941/02/24  [facility]) CM/SW Contact:    Dalia GORMAN Fuse, RN Phone Number: 12/09/2023, 9:50 AM  Clinical Narrative:                 Patient is from home and is on iv abx and iv lasix  due to acute metabolic and hepatic encephalopathy and anscara. Also noted to have multiple electrolyte abnormalities. Therapy unable to see patient due to lethargy. TOC will continue to follow.        Patient Goals and CMS Choice            Expected Discharge Plan and Services                                              Prior Living Arrangements/Services                       Activities of Daily Living   ADL Screening (condition at time of admission) Independently performs ADLs?: No Does the patient have a NEW difficulty with bathing/dressing/toileting/self-feeding that is expected to last >3 days?: Yes (Initiates electronic notice to provider for possible OT consult) Does the patient have a NEW difficulty with getting in/out of bed, walking, or climbing stairs that is expected to last >3 days?: Yes (Initiates electronic notice to provider for possible PT consult) Does the patient have a NEW difficulty with communication that is expected to last >3 days?: No Is the patient deaf or have difficulty hearing?: No Does the patient have difficulty seeing, even when wearing glasses/contacts?: No Does the patient have difficulty concentrating, remembering, or making decisions?: No  Permission Sought/Granted                  Emotional Assessment              Admission diagnosis:  Chronic hyponatremia [E87.1] Delirium [R41.0] Encephalopathy [G93.40] Urinary tract infection, acute [N39.0] Hypothyroidism, unspecified type [E03.9] Patient Active Problem List   Diagnosis Date Noted   Acute encephalopathy 12/08/2023   Cirrhosis  (HCC) 12/08/2023   UTI (urinary tract infection) 12/08/2023   Encephalopathy 12/08/2023   Protein-calorie malnutrition, severe 11/22/2023   Displaced intertrochanteric fracture of right femur, initial encounter for closed fracture (HCC) 11/21/2023   Hypothyroidism 11/21/2023   Essential hypertension 11/21/2023   Elevated LFTs 11/21/2023   Hyponatremia 11/21/2023   Chronic back pain 11/21/2023   Displaced fracture of left femoral neck (HCC) 10/14/2022   Hypertensive urgency 10/14/2022   Dementia without behavioral disturbance (HCC) 10/14/2022   Iron  deficiency anemia 05/14/2022   Maculopapular rash, localized 07/03/2021   Memory loss 05/11/2021   Thrombocytopenia (HCC) 07/10/2019   Skin trauma 07/10/2019   Allergic rhinitis 10/17/2014   Cyst of thyroid  10/17/2014   Clinical depression 10/17/2014   Essential (primary) hypertension 10/17/2014   Fatty infiltration of liver 10/17/2014   Acquired hypothyroidism 10/17/2014   Leukopenia 10/17/2014   OP (osteoporosis) 10/17/2014   Enlargement of spleen 10/17/2014   Phlebectasia 10/17/2014   Avitaminosis D 10/17/2014   HLD (hyperlipidemia) 07/25/2014   PCP:  [name], MD Pharmacy:   [facility] [address], [place] - [address] [place] [address] Phone: [phone] Fax: [phone]  Social Drivers of Health (SDOH) Social History: SDOH Screenings   Food Insecurity: No Food Insecurity (12/08/2023)  Housing: Unknown (12/08/2023)  Transportation Needs: No Transportation Needs (12/08/2023)  Utilities: Not At Risk (12/08/2023)  Alcohol Screen: Low Risk  (05/14/2022)  Depression (PHQ2-9): Medium Risk (09/02/2023)  Financial Resource Strain: Low Risk  (07/05/2022)  Physical Activity: Insufficiently Active (07/05/2022)  Social Connections: Unknown (12/08/2023)  Stress: No Stress Concern Present (07/05/2022)  Tobacco Use: Low Risk  (12/08/2023)   SDOH Interventions:     Readmission Risk  Interventions    11/21/2023   10:14 AM  Readmission Risk Prevention Plan  Post Dischage Appt Complete  Medication Screening Complete  Transportation Screening Complete

## 2023-12-09 NOTE — Progress Notes (Signed)
 Initial Nutrition Assessment  DOCUMENTATION CODES:   Severe malnutrition in context of chronic illness  INTERVENTION:   -Dysphagia 1 diet with thin liquids per SLP assessment; RD will follow for diet advancement and adjust supplement regimen as appropriate -MVI with minerals daily -Ensure Plus High Protein po BID, each supplement provides 350 kcal and 20 grams of protein  -Magic cup TID with meals, each supplement provides 290 kcal and 9 grams of protein  -Feeding assistance with meals   NUTRITION DIAGNOSIS:   Severe Malnutrition related to chronic illness (cirrhosis) as evidenced by moderate fat depletion, severe fat depletion, moderate muscle depletion, severe muscle depletion.  GOAL:   Patient will meet greater than or equal to 90% of their needs  MONITOR:   PO intake, Supplement acceptance, Diet advancement  REASON FOR ASSESSMENT:   Consult Assessment of nutrition requirement/status  ASSESSMENT:   Pt with medical history significant of HTN, HLD, frequent falls with recent hip fracture status post ORIF, cirrhosis, memory loss, moderate to severe protein calorie malnutrition, acute urinary retention on Foley catheter, presented with worsening of confusion, anasarca and hyponatremia.  Pt admitted with acute hepatic encephalopathy.   8/5- s/p Procedure(s): FIXATION, FRACTURE, INTERTROCHANTERIC, WITH INTRAMEDULLARY ROD (Right  Reviewed I/O's: +100 ml x 24 hours  Pt very lethargic at time of visit. She did not arouse to voice or touch, even when palpating during exam.   History obtained from pt daughter at bedside. She reports pt with a general decline in health since hip surgery (she also admits that she was not well prior to surgery). Over the past few days, pt daughter has noticed significant decrease in mentation which has impacted her ability to chew and swallow. Pt requires feeding assistance and will usually eat her meals when fed to her, however, pt has developed  increased difficulty chewing meats and solid foods secondary to mentation. Over the past few days, pt has been taking mostly puddings and soups as they have been easier for her to swallow. Daughter noticed difficulty swallowing pills. She was not on a restricted diet PTA.   Per daughter, pt is small framed and UBW is around 115#. She shares pt has weighed about 90#, but noted she has gained some weight likely related to edema.   Reviewed records from [facility] (where pt has resided for 1-1.5 weeks after hip surgery). Pt was on an NAS diet. Pertinent medications reviewed and include MVI, ferrous sulfate , and megace .   Noted orders at [facility] for SLP evaluation on 12/01/23, which per daughter, has not been completed to her knowledge. Pt daughter interested in pursuing SLP eval for mentation changes and swallow safety. Case discussed with SLP and MD, who agree with SLP eval order.   Per discussion with SLP, pt has been downgraded to a dysphagia 1 diet with thin liquids.   Discussed importance of good meal and supplement intake to promote healing. Pt amenable to Ensure and Magic Cups. Pt also drank [employer] smoothies PTA, which she drank well.   Medications reviewed and include lasix , lactulose , megace , and IV albumin .   Labs reviewed: Na: 129, K: 3.3.  NUTRITION - FOCUSED PHYSICAL EXAM:  Flowsheet Row Most Recent Value  Orbital Region Moderate depletion  Upper Arm Region Severe depletion  Thoracic and Lumbar Region Severe depletion  Buccal Region Severe depletion  Temple Region Moderate depletion  Clavicle Bone Region Severe depletion  Clavicle and Acromion Bone Region Severe depletion  Scapular Bone Region Severe depletion  Dorsal Hand  Severe depletion  Patellar Region No depletion  Anterior Thigh Region No depletion  Posterior Calf Region No depletion  Edema (RD Assessment) Moderate  Hair Reviewed  Eyes Reviewed  Mouth Reviewed  Skin Reviewed  Nails Reviewed     Diet Order:   Diet Order             DIET - DYS 1 Room service appropriate? Yes with Assist; Fluid consistency: Thin  Diet effective now                   EDUCATION NEEDS:   Education needs have been addressed  Skin:  Skin Assessment: Skin Integrity Issues: Skin Integrity Issues:: Incisions Incisions: closed rt hip  Last BM:  Unknown  Height:   Ht Readings from Last 1 Encounters:  12/08/23 5' 1 (1.549 m)    Weight:   Wt Readings from Last 1 Encounters:  12/08/23 57.4 kg    Ideal Body Weight:  47.7 kg  BMI:  Body mass index is 23.91 kg/m.  Estimated Nutritional Needs:   Kcal:  1500-1700  Protein:  70-85 grams  Fluid:  1.5-1.7 L    Margery ORN, RD, LDN, CDCES Registered Dietitian III Certified Diabetes Care and Education Specialist If unable to reach this RD, please use RD Inpatient group chat on secure chat between hours of 8am-4 pm daily

## 2023-12-09 NOTE — Progress Notes (Signed)
 PROGRESS NOTE    Lauren Lawrence  FMW:[id number] DOB: [birth year] DOA: 12/08/2023 PCP: [name], MD   Assessment & Plan:   Principal Problem:   Encephalopathy Active Problems:   Acute encephalopathy   Cirrhosis (HCC)   UTI (urinary tract infection)  Assessment and Plan: Acute hepatic encephalopathy: likely secondary cirrhosis. Was not taking lactulose  or rifaximin at home as per med rec. Started lactulose  enemas as pt not able to po meds currently.   UTI: possibly secondary to foley. Continue on IV rocephin . Urine cx growing klebsiella    Acute on chronic cirrhosis decompensation: was not taking lactulose  or rifaximin at home as per med rec. Restart home dose of lasix . Aldactone  started on admission  Chronic hypoalbuminemia: likely secondary to cirrhosis   Hypothyroidism: continue on home dose of levothyroxine     Indwelling Foley catheter: failed outpatient voiding trial on 12/05/23 and another foley was placed. Continue on home dose of flomax    Chronic hyponatremia: likely secondary to cirrhosis. Labile    Moderate to severe protein calorie malnutrition: BMI 23.9. Will continue on nutritional supplements   Acute ambulation impairment: likely secondary to recent hip fracture s/p ORIF. PT/OT consulted       DVT prophylaxis: lovenox  Code Status: DNR Family Communication: discussed pt's care w/ pt's daughter at bedside and answered her questions  Disposition Plan: depends on PT/OT recs  Level of care: Telemetry Medical  Status is: Inpatient Remains inpatient appropriate because: severity of illness    Consultants:    Procedures:   Antimicrobials:   Subjective: Pt is very lethargic.   Objective: Vitals:   12/08/23 1932 12/08/23 2353 12/09/23 0438 12/09/23 0746  BP: 128/61 (!) 127/58 (!) 154/61 (!) 149/69  Pulse: 87 88 93 80  Resp: 16 16 20 20   Temp: 98.5 F (36.9 C) 98 F (36.7 C) (!) 97.5 F (36.4 C) 98.3 F (36.8 C)  TempSrc:   Oral   SpO2:  98% 99% 98% 97%  Weight:      Height:        Intake/Output Summary (Last 24 hours) at 12/09/2023 0857 Last data filed at 12/08/2023 1523 Gross per 24 hour  Intake 100 ml  Output --  Net 100 ml   Filed Weights   12/08/23 1158 12/08/23 1638  Weight: 51.3 kg 57.4 kg    Examination:  General exam: Appears lethargic Respiratory system: decreased breath sounds b/l Cardiovascular system: S1 & S2+. No rubs, gallops or clicks.  Gastrointestinal system: Abdomen is nondistended, soft and nontender. Normal bowel sounds heard. Central nervous system: lethargic Psychiatry: Judgement and insight appears poor     Data Reviewed: I have personally reviewed following labs and imaging studies  CBC: Recent Labs  Lab 12/08/23 1300  WBC 7.0  HGB 10.6*  HCT 30.3*  MCV 107.1*  PLT 196   Basic Metabolic Panel: Recent Labs  Lab 12/08/23 1300 12/09/23 0454  NA 128* 129*  K 3.7 3.3*  CL 97* 97*  CO2 22 21*  GLUCOSE 99 89  BUN 15 15  CREATININE 0.60 0.61  CALCIUM 8.1* 8.4*   GFR: Estimated Creatinine Clearance: 44.2 mL/min (by C-G formula based on SCr of 0.61 mg/dL). Liver Function Tests: Recent Labs  Lab 12/08/23 1300 12/09/23 0454  AST 45* 41  ALT 26 25  ALKPHOS 95 81  BILITOT 2.4* 2.7*  PROT 4.8* 5.4*  ALBUMIN  2.4* 3.4*   No results for input(s): LIPASE, AMYLASE in the last 168 hours. Recent Labs  Lab 12/08/23 1300  12/09/23 0454  AMMONIA 55* 110*   Coagulation Profile: Recent Labs  Lab 12/08/23 1300  INR 1.4*   Cardiac Enzymes: Recent Labs  Lab 12/08/23 1300  CKTOTAL 106   BNP (last 3 results) No results for input(s): PROBNP in the last 8760 hours. HbA1C: No results for input(s): HGBA1C in the last 72 hours. CBG: No results for input(s): GLUCAP in the last 168 hours. Lipid Profile: No results for input(s): CHOL, HDL, LDLCALC, TRIG, CHOLHDL, LDLDIRECT in the last 72 hours. Thyroid  Function Tests: Recent Labs    12/08/23 1300   TSH 52.000*  FREET4 1.13*   Anemia Panel: No results for input(s): VITAMINB12, FOLATE, FERRITIN, TIBC, IRON , RETICCTPCT in the last 72 hours. Sepsis Labs: Recent Labs  Lab 12/08/23 1300 12/08/23 1600  LATICACIDVEN 1.8 1.6    Recent Results (from the past 240 hours)  Blood Culture (routine x 2)     Status: None (Preliminary result)   Collection Time: 12/08/23  1:00 PM   Specimen: BLOOD  Result Value Ref Range Status   Specimen Description BLOOD LEFT ANTECUBITAL  Final   Special Requests   Final    BOTTLES DRAWN AEROBIC AND ANAEROBIC Blood Culture results may not be optimal due to an inadequate volume of blood received in culture bottles   Culture   Final    NO GROWTH < 24 HOURS Performed at [facility], [address] [place], [place] [address]    Report Status PENDING  Incomplete  Blood Culture (routine x 2)     Status: None (Preliminary result)   Collection Time: 12/08/23  2:37 PM   Specimen: BLOOD RIGHT WRIST  Result Value Ref Range Status   Specimen Description BLOOD RIGHT WRIST  Final   Special Requests   Final    BOTTLES DRAWN AEROBIC AND ANAEROBIC Blood Culture adequate volume   Culture   Final    NO GROWTH < 24 HOURS Performed at [facility], [address] [place], [place] [address]    Report Status PENDING  Incomplete         Radiology Studies: US  ASCITES (ABDOMEN LIMITED) Result Date: 12/08/2023 EXAM: LIMITED ABDOMINAL ULTRASOUND FOR ASCITES EVALUATION TECHNIQUE: Limited real-time sonography of all 4 quadrants of the abdomen was performed for evaluation of ascites. COMPARISON: None. CLINICAL HISTORY: Anasarca. FINDINGS: Small volume scattered abdominal ascites. OTHER: Limited visualization of the rest of the abdomen demonstrates no acute abnormality. IMPRESSION: 1. Small volume scattered abdominal ascites. Electronically signed by: Katheleen Faes MD 12/08/2023 04:33 PM EDT RP Workstation: [id number]   CT Head Wo  Contrast Result Date: 12/08/2023 CLINICAL DATA:  Delirium EXAM: CT HEAD WITHOUT CONTRAST TECHNIQUE: Contiguous axial images were obtained from the base of the skull through the vertex without intravenous contrast. RADIATION DOSE REDUCTION: This exam was performed according to the departmental dose-optimization program which includes automated exposure control, adjustment of the mA and/or kV according to patient size and/or use of iterative reconstruction technique. COMPARISON:  October 14, 2022 FINDINGS: Brain: Proportional prominence of the ventricles and sulci, consistent with diffuse cerebral parenchymal volume loss. The ventricles otherwise maintained midline position without midline shift. Gray-white differentiation is preserved.Scattered periventricular white matter hypoattenuation, most consistent with changes of mild chronic ischemic microvascular disease.No evidence of acute territorial infarction, extra-axial fluid collection, hemorrhage, or mass lesion. The basilar cisterns are patent without downward herniation. The cerebellar hemispheres and vermis are well formed without mass lesion or focal attenuation abnormality. Vascular: No hyperdense vessel. Calcified atherosclerotic plaque within the cavernous/supraclinoid  internal carotid arteries. Skull: Normal. Negative for fracture or focal lesion. Sinuses/Orbits: Minimal layering fluid in the right sphenoid sinus with bilateral mastoid effusions.The globes appear intact. No retrobulbar hematoma. Other: None. IMPRESSION: 1. No acute intracranial abnormality, specifically, no acute hemorrhage, territorial infarction, or intracranial mass. 2. Minimal layering fluid in the right sphenoid sinus with bilateral mastoid effusions. This could reflect changes of early or developing acute sinusitis. Clinical correlation requested. 3. Mild cerebral volume loss with sequelae of mild chronic ischemic microvascular disease. Electronically Signed   By: Rogelia Myers M.D.    On: 12/08/2023 14:04   DG Chest Port 1 View Result Date: 12/08/2023 CLINICAL DATA:  Possible sepsis.  Tachycardia. EXAM: PORTABLE CHEST 1 VIEW COMPARISON:  11/20/2023 FINDINGS: Poor inspiration. Allowing for that, the heart and mediastinal shadows are normal in the lungs are probably clear. No edema or effusion. IMPRESSION: Poor inspiration. No active disease suspected. Electronically Signed   By: Oneil Officer M.D.   On: 12/08/2023 13:07        Scheduled Meds:  Chlorhexidine  Gluconate Cloth  6 each Topical Daily   enoxaparin  (LOVENOX ) injection  40 mg Subcutaneous Q24H   feeding supplement  237 mL Oral BID BM   furosemide   40 mg Intravenous BID   lactulose   30 g Oral Daily   levothyroxine   100 mcg Oral Daily   megestrol   400 mg Oral Daily   metoprolol  succinate  100 mg Oral Daily   spironolactone   50 mg Oral Daily   tamsulosin   0.4 mg Oral Daily   Continuous Infusions:  albumin  human 25 g (12/09/23 0415)   cefTRIAXone  (ROCEPHIN )  IV       LOS: 1 day       Anthony CHRISTELLA Pouch, MD [facility] Pager [phone]  If 7PM-7AM, please contact night-coverage [email or web address] 12/09/2023, 8:57 AM

## 2023-12-10 DIAGNOSIS — K7682 Hepatic encephalopathy: Secondary | ICD-10-CM | POA: Diagnosis not present

## 2023-12-10 LAB — COMPREHENSIVE METABOLIC PANEL WITH GFR
ALT: 19 U/L (ref 0–44)
AST: 32 U/L (ref 15–41)
Albumin: 2.9 g/dL — ABNORMAL LOW (ref 3.5–5.0)
Alkaline Phosphatase: 59 U/L (ref 38–126)
Anion gap: 3 — ABNORMAL LOW (ref 5–15)
BUN: 16 mg/dL (ref 8–23)
CO2: 28 mmol/L (ref 22–32)
Calcium: 8.2 mg/dL — ABNORMAL LOW (ref 8.9–10.3)
Chloride: 101 mmol/L (ref 98–111)
Creatinine, Ser: 0.55 mg/dL (ref 0.44–1.00)
GFR, Estimated: >60 mL/min (ref >60)
Glucose, Bld: 96 mg/dL (ref 70–99)
Potassium: 3.3 mmol/L — ABNORMAL LOW (ref 3.5–5.1)
Sodium: 132 mmol/L — ABNORMAL LOW (ref 135–145)
Total Bilirubin: 2.6 mg/dL — ABNORMAL HIGH (ref 0.0–1.2)
Total Protein: 4.4 g/dL — ABNORMAL LOW (ref 6.5–8.1)

## 2023-12-10 LAB — CBC
HCT: 26.1 % — ABNORMAL LOW (ref 36.0–46.0)
Hemoglobin: 9.1 g/dL — ABNORMAL LOW (ref 12.0–15.0)
MCH: 37.4 pg — ABNORMAL HIGH (ref 26.0–34.0)
MCHC: 34.9 g/dL (ref 30.0–36.0)
MCV: 107.4 fL — ABNORMAL HIGH (ref 80.0–100.0)
Platelets: 108 K/uL — ABNORMAL LOW (ref 150–400)
RBC: 2.43 MIL/uL — ABNORMAL LOW (ref 3.87–5.11)
RDW: 17.8 % — ABNORMAL HIGH (ref 11.5–15.5)
WBC: 3.4 K/uL — ABNORMAL LOW (ref 4.0–10.5)
nRBC: 0 % (ref 0.0–0.2)

## 2023-12-10 LAB — URINE CULTURE: Culture: >=100000 — AB

## 2023-12-10 LAB — AMMONIA: Ammonia: 56 umol/L — ABNORMAL HIGH (ref 9–35)

## 2023-12-10 MED ORDER — POTASSIUM CHLORIDE CRYS ER 20 MEQ PO TBCR
40.0000 meq | EXTENDED_RELEASE_TABLET | Freq: Once | ORAL | Status: AC
Start: 2023-12-10 — End: 2023-12-10
  Administered 2023-12-10: 40 meq via ORAL

## 2023-12-10 MED ORDER — RIFAXIMIN 550 MG PO TABS
550.0000 mg | ORAL_TABLET | Freq: Two times a day (BID) | ORAL | Status: DC
  Administered 2023-12-10 – 2023-12-13 (×7): 550 mg via ORAL
  Filled 2023-12-10 (×7): qty 1

## 2023-12-10 MED ORDER — LACTULOSE 10 GM/15ML PO SOLN
20.0000 g | Freq: Two times a day (BID) | ORAL | Status: DC
  Administered 2023-12-10 – 2023-12-11 (×3): 20 g via ORAL
  Filled 2023-12-10 (×2): qty 30

## 2023-12-10 NOTE — Evaluation (Signed)
 Occupational Therapy Evaluation Patient Details Name: Lauren Lawrence MRN: [id number] DOB: 17-Nov-1940 Today's Date: 12/10/2023   History of Present Illness   Pt. is a 83 y.o. female with medical history significant of HTN, HLD, frequent falls with recent hip fracture status post ORIF, cirrhosis, memory loss, moderate to severe protein calorie malnutrition, acute urinary retention on Foley catheter, presented with worsening of confusion, anasarca and hyponatremia     Clinical Impressions Pt. Presents with lethargy, impaired cognition,weakness, limited activity tolerance, and limited functional mobility which hinders her ability to complete basic ADL and IADL functioning. Pt. Resides at home with her daughter.  Pt.'s daughter assists Pt. Tub shower transfers using a transfer tub bench, set-up of morning clothing, medication management, transportation, home management, and meal preparation skills. Pt. Was sleeping soundly upon arrival. Pt. Was able to alert to engage with intervals of lethargy.  Pt. required maxA physical, and cognitive assist to initiate, and perform hand to face patterns for self-grooming wiping her face, and combing her hair with hand-over-hand assist. Pt. tolerated PROM/AAROM bilaterally with tightness noted in the left elbow flexors, and the right elbow abductors. Pt/caregiver education was provided about opportunities for  increasing engagement in one step grooming tasks with assist required. Pt. Will benefit from OT services for ADL training, A/E training, UE there. Ex. and Pt./caregiver education about  pt. ADL functioning, and Pt. Functional status.     If plan is discharge home, recommend the following:   A lot of help with walking and/or transfers;A lot of help with bathing/dressing/bathroom;Assistance with cooking/housework;Assistance with feeding;Assist for transportation;Supervision due to cognitive status;Direct supervision/assist for financial management      Functional Status Assessment   Patient has had a recent decline in their functional status and demonstrates the ability to make significant improvements in function in a reasonable and predictable amount of time.     Equipment Recommendations         Recommendations for Other Services         Precautions/Restrictions   Precautions Precautions: Fall Recall of Precautions/Restrictions: Impaired Restrictions Weight Bearing Restrictions Per Provider Order: Yes RLE Weight Bearing Per Provider Order: Weight bearing as tolerated Other Position/Activity Restrictions: from prior hip fx, and ORIF repair 11/22/23.     Mobility Bed Mobility Overal bed mobility:  (MaxA) Bed Mobility: Supine to Sit     Supine to sit: Max assist          Transfers                   General transfer comment: Deferred; 2/2 lethargy and cognition      Balance                                           ADL either performed or assessed with clinical judgement   ADL Overall ADL's : Needs assistance/impaired                                       General ADL Comments: Pt. requires hand-over-hand assist for one step hand to face patterns for self-grooming tasks.     Vision Baseline Vision/History:  (TBD 2/2 cognition)       Perception         Praxis         Pertinent Vitals/Pain  Pain Assessment Pain Assessment: Faces Pain Score: 0-No pain     Extremity/Trunk Assessment Upper Extremity Assessment Upper Extremity Assessment: Generalized weakness;Difficult to assess due to impaired cognition   Lower Extremity Assessment Lower Extremity Assessment: Generalized weakness;RLE deficits/detail RLE Deficits / Details: significant bruising along RLE to knee       Communication Communication Communication: Impaired Factors Affecting Communication: Hearing impaired   Cognition Arousal: Lethargic   Cognition: Cognition impaired    Orientation impairments: Person, Place, Time                           Following commands: Impaired Following commands impaired: Follows one step commands inconsistently, Follows one step commands with increased time     Cueing  General Comments   Cueing Techniques: Verbal cues;Tactile cues;Visual cues;Gestural cues      Exercises     Shoulder Instructions      Home Living Family/patient expects to be discharged to:: Private residence Living Arrangements: Children Available Help at Discharge: Family;Available 24 hours/day Type of Home: House Home Access: Stairs to enter [employer] of Steps: 3 Entrance Stairs-Rails: Right Home Layout: One level     Bathroom Shower/Tub: Chief Strategy Officer: Standard Bathroom Accessibility: No   Home Equipment: Agricultural consultant (2 wheels);Cane - quad;Shower seat   Additional Comments: 24/7 support from daughters      Prior Functioning/Environment Prior Level of Function : Needs assist  Cognitive Assist : ADLs (cognitive)     Physical Assist : Mobility (physical);ADLs (physical) Mobility (physical): Transfers;Gait;Stairs ADLs (physical): Grooming;Bathing;Dressing;Toileting;IADLs Mobility Comments: uses SPC/RW at baseline in community. Furniture cruises inside home. ADLs Comments: Dautgher assists with toileting care needs, lays clothing out for her prior to dressing, medication management, and meal preparation.    OT Problem List:     OT Treatment/Interventions: Self-care/ADL training;Therapeutic exercise;DME and/or AE instruction;Patient/family education;Therapeutic activities;Neuromuscular education      OT Goals(Current goals can be found in the care plan section)   Acute Rehab OT Goals OT Goal Formulation: Patient unable to participate in goal setting Time For Goal Achievement: 01/07/24   OT Frequency:  Min 2X/week    Co-evaluation              AM-PAC OT 6 Clicks Daily  Activity     Outcome Measure Help from another person eating meals?: A Lot Help from another person taking care of personal grooming?: A Lot Help from another person toileting, which includes using toliet, bedpan, or urinal?: Total Help from another person bathing (including washing, rinsing, drying)?: Total Help from another person to put on and taking off regular upper body clothing?: A Lot Help from another person to put on and taking off regular lower body clothing?: Total 6 Click Score: 9   End of Session    Activity Tolerance: Patient limited by lethargy Patient left: in bed;with bed alarm set;with call bell/phone within reach;with family/visitor present                   Time: 8872-8842 OT Time Calculation (min): 30 min Charges:  OT General Charges $OT Visit: 1 Visit OT Evaluation $OT Eval Moderate Complexity: 1 Mod  Richardson Otter, MS, OTR/L   Richardson Otter 12/10/2023, 2:53 PM

## 2023-12-10 NOTE — Evaluation (Signed)
 Physical Therapy Evaluation Patient Details Name: Lauren Lawrence MRN: [id number] DOB: [birth year] Today's Date: 12/10/2023  History of Present Illness  Lauren Lawrence is a 83 y.o. female with medical history significant of HTN, HLD, frequent falls with recent hip fracture status post ORIF, cirrhosis, memory loss, moderate to severe protein calorie malnutrition, acute urinary retention on Foley catheter, presented with worsening of confusion, anasarca and hyponatremia.   Clinical Impression  Pt admitted with above diagnosis. Pt currently with functional limitations due to the deficits listed below (see PT Problem List). Pt received semi reclined in bed asleep. Daughter present at bedside. PLOF and subjective reporting recently recorded from yesterday's attempts. PTA pt typically furniture cruises in household and ambulates in community with RW/SPC. Per daughter, pt with more difficulty with mobility since recent R hip fx.   To date, pt awakens to voice. Use of natural light and lights in room to assist in waking pt up. A&O to self only. Re-oriented pt to situation, time, and location. Able to articulate pt's visitor as family. Has pain in B calves noted with palpation with L > R. No redness or swelling, not warm to touch which would indicate DVT. Pt is very plantar flexed, notable gastroc tightness bilat. Requires maxA+1 for supine to sit with in ability to follow multi modal cuing for LE and UE sequencing. Once seated EoB pt has fair sitting balance with hand over hand placement of pt's extremities. X3 STS performed at bedside with bed mildly elevated needing min to modA+1 for STS with hand over hand and multi modal cues for hand placement and anterior trunk lean. Multiple attempts needed for pericare due to small BM in bed. SLP entering room assisting with linens. Pt dependent for pericare at [facility] and only able to take minimal 1-2 steps to the R towards [facility] before needing a seat and totalA+2 to return to upright  in bed. Pt and daughter reviewed on importance of mobility and gastroc stretching to prevent contractures. Pt will benefit from skilled PT services < 3 hours/day to address current deficits and reduced falls risk to maximize return to PLOF. Pt left in care of SLP with all needs in reach.       If plan is discharge home, recommend the following: A lot of help with walking and/or transfers;A lot of help with bathing/dressing/bathroom;Assist for transportation;Help with stairs or ramp for entrance;Supervision due to cognitive status   Can travel by private vehicle   No    Equipment Recommendations Other (comment) (TBD by next venue of care)  Recommendations for Other Services       Functional Status Assessment Patient has had a recent decline in their functional status and demonstrates the ability to make significant improvements in function in a reasonable and predictable amount of time.     Precautions / Restrictions Precautions Precautions: Fall Recall of Precautions/Restrictions: Impaired Restrictions Weight Bearing Restrictions Per Provider Order: Yes RLE Weight Bearing Per Provider Order: Weight bearing as tolerated Other Position/Activity Restrictions: from prior hip fx on 11/22/23.      Mobility  Bed Mobility Overal bed mobility: Needs Assistance Bed Mobility: Supine to Sit, Sit to Supine     Supine to sit: Max assist Sit to supine: Total assist, +2 for physical assistance     Patient Response: Cooperative, Flat affect  Transfers Overall transfer level: Needs assistance Equipment used: Rolling walker (2 wheels) Transfers: Sit to/from Stand Sit to Stand: Min assist, From elevated surface  General transfer comment: x3 STS efforts at EOB. MinA to modA for STS wth bed mildly elevated.    Ambulation/Gait               General Gait Details: unable to take more than 1-2 side steps towards [facility]  Stairs            Wheelchair Mobility      Tilt Bed Tilt Bed Patient Response: Cooperative, Flat affect  Modified Rankin (Stroke Patients Only)       Balance Overall balance assessment: Needs assistance Sitting-balance support: Feet supported, Single extremity supported, Bilateral upper extremity supported Sitting balance-Leahy Scale: Fair Sitting balance - Comments: can sit EOB at supervision with assistance with UE and LE placement.   Standing balance support: During functional activity, Reliant on assistive device for balance, Bilateral upper extremity supported Standing balance-Leahy Scale: Poor Standing balance comment: heavy posterior lean in standing                             Pertinent Vitals/Pain Pain Assessment Pain Assessment: Faces Faces Pain Scale: Hurts whole lot Pain Descriptors / Indicators: Grimacing, Guarding Pain Intervention(s): Limited activity within patient's tolerance, Monitored during session, Repositioned    Home Living Family/patient expects to be discharged to:: Private residence Living Arrangements: Children Available Help at Discharge: Family;Available 24 hours/day Type of Home: House Home Access: Stairs to enter Entrance Stairs-Rails: Right Entrance Stairs-Number of Steps: 3   Home Layout: One level Home Equipment: Agricultural consultant (2 wheels);Cane - quad;Shower seat Additional Comments: 24/7 support from daughters    Prior Function               Mobility Comments: uses SPC/RW at baseline in community. Furniture cruises inside home. ADLs Comments: Assistance from daughter to complete ADLs and IADLs     Extremity/Trunk Assessment   Upper Extremity Assessment Upper Extremity Assessment: Defer to OT evaluation    Lower Extremity Assessment Lower Extremity Assessment: Generalized weakness;RLE deficits/detail RLE Deficits / Details: significant bruising along RLE to knee       Communication   Communication Communication: Impaired Factors Affecting  Communication: Hearing impaired    Cognition Arousal: Alert Behavior During Therapy: Flat affect   PT - Cognitive impairments: History of cognitive impairments                       PT - Cognition Comments: A&Ox1 to person. Following commands: Impaired Following commands impaired: Follows one step commands inconsistently, Follows one step commands with increased time     Cueing Cueing Techniques: Verbal cues, Tactile cues, Visual cues, Gestural cues     General Comments      Exercises Other Exercises Other Exercises: discussed gentle gastroc stretching to pt and spouse as appears very plantarfelxed bilat and concerns for future contracture and how that can affect pt's ambulation and transfer capacity.   Assessment/Plan    PT Assessment Patient needs continued PT services  PT Problem List Decreased strength;Decreased balance;Decreased cognition;Decreased activity tolerance;Decreased mobility;Decreased range of motion       PT Treatment Interventions DME instruction;Therapeutic activities;Gait training;Therapeutic exercise;Patient/family education;Stair training;Balance training;Functional mobility training;Cognitive remediation;Neuromuscular re-education    PT Goals (Current goals can be found in the Care Plan section)  Acute Rehab PT Goals PT Goal Formulation: Patient unable to participate in goal setting Time For Goal Achievement: 12/24/23    Frequency Min 3X/week     Co-evaluation  AM-PAC PT 6 Clicks Mobility  Outcome Measure Help needed turning from your back to your side while in a flat bed without using bedrails?: A Lot Help needed moving from lying on your back to sitting on the side of a flat bed without using bedrails?: A Lot Help needed moving to and from a bed to a chair (including a wheelchair)?: Total Help needed standing up from a chair using your arms (e.g., wheelchair or bedside chair)?: A Lot Help needed to walk in  hospital room?: Total Help needed climbing 3-5 steps with a railing? : Total 6 Click Score: 9    End of Session Equipment Utilized During Treatment: Gait belt Activity Tolerance: Patient limited by fatigue Patient left: in bed;with call bell/phone within reach;with bed alarm set;with family/visitor present (SLP present) Nurse Communication: Mobility status PT Visit Diagnosis: Unsteadiness on feet (R26.81);Muscle weakness (generalized) (M62.81);Other abnormalities of gait and mobility (R26.89)    Time: 9147-9082 PT Time Calculation (min) (ACUTE ONLY): 25 min   Charges:   PT Evaluation $PT Eval Moderate Complexity: 1 Mod PT Treatments $Therapeutic Activity: 8-22 mins PT General Charges $$ ACUTE PT VISIT: 1 Visit         Dorina HERO. Fairly IV, PT, DPT Physical Therapist- [place]  [facility] 12/10/2023, 11:15 AM

## 2023-12-10 NOTE — Evaluation (Signed)
 Clinical/Bedside Swallow Evaluation Patient Details  Name: Lauren Lawrence MRN: [id number] Date of Birth: [birth year]  Today's Date: 12/10/2023 Time: SLP Start Time (ACUTE ONLY): 0920 SLP Stop Time (ACUTE ONLY): 1020 SLP Time Calculation (min) (ACUTE ONLY): 60 min  Past Medical History:  Past Medical History:  Diagnosis Date   Depression    Hyperlipidemia    Hypertension    Memory loss    Past Surgical History:  Past Surgical History:  Procedure Laterality Date   CATARACT EXTRACTION  03/2020   HIP ARTHROPLASTY Left 10/14/2022   Procedure: ARTHROPLASTY BIPOLAR HIP (HEMIARTHROPLASTY);  Surgeon: Margrette Taft BRAVO, MD;  Location: [facility];  Service: Orthopedics;  Laterality: Left;   INTRAMEDULLARY (IM) NAIL INTERTROCHANTERIC Right 11/22/2023   Procedure: FIXATION, FRACTURE, INTERTROCHANTERIC, WITH INTRAMEDULLARY ROD;  Surgeon: Margrette Taft BRAVO, MD;  Location: [facility];  Service: Orthopedics;  Laterality: Right;   HPI:  Pt is a 83 y.o. female with medical history significant of Dementia/memory loss, frequent falls with recent hip fracture status post ORIF, cirrhosis, HLD, HTN, moderate to severe protein calorie malnutrition, acute urinary retention on Foley catheter, presented with worsening of confusion, anasarca and hyponatremia.  She lives at her child's home w/ assistance w/ all ADLs.    CXR: Poor inspiration. No active disease suspected.    Assessment / Plan / Recommendation  Clinical Impression   Pt seen for BSE this morning. Pt had just completed her PT session and was alert/awake. Pt verbal at times; Cognitive decline at Baseline. She fed herself w/ full tray setup and support/cues. Min bilat. UE weakness noted. She followed direction w/ min-mod cues. Daughter present.  On RA, afebrile. WBC not elevated.  Pt appears to present w/ functional pharyngeal phase swallowing w/ min oral phase dysphagia c/b min increased oral phase time for bolus management/clearing w/ increased textured foods.  No oropharyngeal phase dysphagia noted w/ less textured foods and thin liquids; No neuromuscular deficits noted. Pt consumed po trials w/ No overt, clinical s/s of aspiration during po trials.  Pt appears at reduced risk for aspiration following general aspiration precautions. Suspect impact fro Cognitive decline in the oral phase.  Pt does have challenging factors that could impact her oropharyngeal swallowing to include deconditioning/weakness, malnutrition Baseline, Cognitive decline/Dementia Baseline, need for support w/ setup and feeding at meals, and advanced age as well as current injury hampering her UE movements and self-feeding abilities. These factors can increase risk for dysphagia as well as decreased oral intake overall.   During po trials, pt consumed all consistencies w/ no overt coughing, decline in vocal quality, or change in respiratory presentation during/post trials. O2 sats in the upper 90s during. Oral phase appeared [facility] w/ timely bolus management and control of bolus propulsion for A-P transfer for swallowing w/ trials of thin liquids and purees. W/ bolus mastication and management of increased textured solid foods, min increased Time was needed as well as more vertical, munchy chewing noted. Oral clearing achieved w/ more solid food trials when given Time and moistening the foods given.  OM Exam was cursory but appeared [facility] w/ no unilateral weakness noted. Speech Clear. Pt helped to feed self w/ setup and support.   Recommend a more MINCED foods consistency diet w/ well-moistened foods; Thin liquids -- monitor straw use, and pt should help to Hold Cup when drinking. Recommend general aspiration precautions including small bites/sips and reduce distractions during meals. Alternate foods/liquids to intake and clearing. Support pt in helping to feed self at meals for best  engagement, safety. Pills CRUSHED vs WHOLE in Puree for safer, easier swallowing -- it was encouraged now and for D/C  to the Dtr.   Education and Handouts given on Pills in Puree; food consistencies and easy to eat options; general aspiration precautions to pt and Dtr. MD/NSG to reconsult if any new needs arise. NSG updated, agreed. MD updated. Recommend Dietician f/u for support. Palliative Care f/u for support/education if needed. SLP Visit Diagnosis: Dysphagia, oral phase (R13.11) (Baseline Dementia/Cognitive decline; need for setup/support at meals)    Aspiration Risk  Mild aspiration risk;Risk for inadequate nutrition/hydration (reduced when following precautions; supervision)    Diet Recommendation   Thin;Dysphagia 2 (chopped) (added purees) = a more MINCED foods consistency diet w/ well-moistened foods; Thin liquids -- monitor straw use, and pt should help to Hold Cup when drinking. Recommend general aspiration precautions including small bites/sips and reduce distractions during meals. Alternate foods/liquids to intake and clearing. Support pt in helping to feed self at meals for best engagement, safety.  Medication Administration: Crushed with puree (vs Whole in puree if able to)    Other  Recommendations Recommended Consults:  (Dietician f/u) Oral Care Recommendations: Oral care BID;Oral care before and after PO;Staff/trained caregiver to provide oral care     Assistance Recommended at Discharge  Full at meals  Functional Status Assessment Patient has had a recent decline in their functional status and demonstrates the ability to make significant improvements in function in a reasonable and predictable amount of time.  Frequency and Duration  (n/a)   (n/a)       Prognosis Prognosis for improved oropharyngeal function: Fair Barriers to Reach Goals: Cognitive deficits;Language deficits;Time post onset;Severity of deficits Barriers/Prognosis Comment: Baseline Dementia/Cognitive decline; need for setup/support at meals      Swallow Study   General Date of Onset: 12/08/23 HPI: Pt is a 83 y.o.  female with medical history significant of Dementia/memory loss, frequent falls with recent hip fracture status post ORIF, cirrhosis, HLD, HTN, moderate to severe protein calorie malnutrition, acute urinary retention on Foley catheter, presented with worsening of confusion, anasarca and hyponatremia.  She lives at her child's home w/ assistance w/ all ADLs.    CXR: Poor inspiration. No active disease suspected. Type of Study: Bedside Swallow Evaluation Previous Swallow Assessment: none Diet Prior to this Study: Dysphagia 1 (pureed);Thin liquids (Level 0) (after downgrade from regular) Temperature Spikes Noted: No (wbc 3.4) Respiratory Status: Room air History of Recent Intubation: No Behavior/Cognition: Alert;Cooperative;Pleasant mood;Confused;Distractible;Requires cueing (baseline Cognitive decline) Oral Cavity Assessment: Within Functional Limits Oral Care Completed by SLP: Yes Oral Cavity - Dentition: Adequate natural dentition Vision: Functional for self-feeding Self-Feeding Abilities: Able to feed self;Needs assist;Needs set up (support and supervision) Patient Positioning: Upright in bed (full support) Baseline Vocal Quality: Low vocal intensity Volitional Cough: Cognitively unable to elicit Volitional Swallow: Unable to elicit    Oral/Motor/Sensory Function Overall Oral Motor/Sensory Function: Within functional limits   Ice Chips Ice chips: Within functional limits Presentation: Spoon (fed; 2 trials)   Thin Liquid Thin Liquid: Within functional limits Presentation: Cup;Self Fed;Straw (~6-7 ozs total) Other Comments: water, milk, juice    Nectar Thick Nectar Thick Liquid: Not tested   Honey Thick Honey Thick Liquid: Not tested   Puree Puree: Within functional limits Presentation: Self Fed;Spoon (supported; ~4 ozs)   Solid     Solid: Impaired (min) Presentation: Self Fed;Spoon (supported; 6 trials) Oral Phase Impairments: Poor awareness of bolus;Impaired mastication (min) Oral  Phase Functional Implications: Prolonged oral transit;Impaired  mastication (munchy/vertical) Pharyngeal Phase Impairments:  (none) Other Comments: moistened foods well        Comer Portugal, MS, CCC-SLP Speech Language Pathologist Rehab Services; [facility] [place] [phone] (ascom) Danila Eddie 12/10/2023,1:21 PM

## 2023-12-10 NOTE — Plan of Care (Signed)
  Problem: Clinical Measurements: Goal: Will remain free from infection Outcome: Progressing Goal: Diagnostic test results will improve Outcome: Progressing Goal: Respiratory complications will improve Outcome: Progressing Goal: Cardiovascular complication will be avoided Outcome: Progressing   Problem: Activity: Goal: Risk for activity intolerance will decrease Outcome: Progressing   Problem: Nutrition: Goal: Adequate nutrition will be maintained Outcome: Progressing   Problem: Coping: Goal: Level of anxiety will decrease Outcome: Progressing   Problem: Pain Managment: Goal: General experience of comfort will improve and/or be controlled Outcome: Progressing   Problem: Safety: Goal: Ability to remain free from injury will improve Outcome: Progressing   Problem: Skin Integrity: Goal: Risk for impaired skin integrity will decrease Outcome: Progressing

## 2023-12-10 NOTE — Progress Notes (Signed)
 PROGRESS NOTE    Lauren Lawrence  FMW:[id number] DOB: 06-Dec-1940 DOA: 12/08/2023 PCP: [name], MD   Assessment & Plan:   Principal Problem:   Encephalopathy Active Problems:   Acute encephalopathy   Cirrhosis (HCC)   UTI (urinary tract infection)  Assessment and Plan: Acute hepatic encephalopathy: likely secondary cirrhosis. Was not taking lactulose  or rifaximin  at home as per med rec. Ammonia level is still elevated but trending down. More awake today but still very confused. Oriented to person only   UTI: possibly secondary to foley. Continue on IV rocephin . Urine cx growing klebsiella    Acute on chronic cirrhosis decompensation: was not taking lactulose  or rifaximin  at home as per med rec. Continue on lasix  and aldactone  (aldactone  was started this admission). Will start rifaximin  as well   Chronic hypoalbuminemia: likely secondary to cirrhosis    Hypothyroidism: continue on home dose of levothyroxine      Indwelling Foley catheter: failed outpatient voiding trial on 12/05/23 and another foley was placed. Continue on home dose of flomax     Chronic hyponatremia: likely secondary to cirrhosis. Trending up    Moderate to severe protein calorie malnutrition: BMI 23.9. Continue on nutritional supplementa    Acute ambulation impairment: likely secondary to recent hip fracture s/p ORIF. PT recs SNF. OT consulted        DVT prophylaxis: lovenox  Code Status: DNR Family Communication: discussed pt's care w/ pt's daughter at bedside and answered her questions  Disposition Plan: possibly d/c to SNF  Level of care: Telemetry Medical  Status is: Inpatient Remains inpatient appropriate because: severity of illness    Consultants:    Procedures:   Antimicrobials:   Subjective: Pt is oriented to self only   Objective: Vitals:   12/09/23 1707 12/09/23 2027 12/10/23 0337 12/10/23 0746  BP: 124/63 (!) 123/57 (!) 113/91 (!) 118/43  Pulse: 89 94 87 74  Resp: 19  17  16   Temp: 98.6 F (37 C) 98.1 F (36.7 C) 99.1 F (37.3 C) 98.4 F (36.9 C)  TempSrc: Oral     SpO2: 96% 97% 97% 99%  Weight:      Height:        Intake/Output Summary (Last 24 hours) at 12/10/2023 0836 Last data filed at 12/10/2023 0600 Gross per 24 hour  Intake 520 ml  Output 4025 ml  Net -3505 ml   Filed Weights   12/08/23 1158 12/08/23 1638  Weight: 51.3 kg 57.4 kg    Examination:  General exam: appears comfortable but confused Respiratory system: diminished breath sounds b/l  Cardiovascular system: S1/S2+. No rubs or clicks   Gastrointestinal system: Abd is soft, NT, ND & normal bowel sounds Central nervous system: awake & oriented to self only . Moves all extremities Psychiatry: Judgement and insight appears poor. Flat mood and affect    Data Reviewed: I have personally reviewed following labs and imaging studies  CBC: Recent Labs  Lab 12/08/23 1300 12/10/23 0633  WBC 7.0 3.4*  HGB 10.6* 9.1*  HCT 30.3* 26.1*  MCV 107.1* 107.4*  PLT 196 108*   Basic Metabolic Panel: Recent Labs  Lab 12/08/23 1300 12/09/23 0454 12/10/23 0633  NA 128* 129* 132*  K 3.7 3.3* 3.3*  CL 97* 97* 101  CO2 22 21* 28  GLUCOSE 99 89 96  BUN 15 15 16   CREATININE 0.60 0.61 0.55  CALCIUM 8.1* 8.4* 8.2*   GFR: Estimated Creatinine Clearance: 44.2 mL/min (by C-G formula based on SCr of 0.55 mg/dL).  Liver Function Tests: Recent Labs  Lab 12/08/23 1300 12/09/23 0454 12/10/23 0633  AST 45* 41 32  ALT 26 25 19   ALKPHOS 95 81 59  BILITOT 2.4* 2.7* 2.6*  PROT 4.8* 5.4* 4.4*  ALBUMIN  2.4* 3.4* 2.9*   No results for input(s): LIPASE, AMYLASE in the last 168 hours. Recent Labs  Lab 12/08/23 1300 12/09/23 0454 12/10/23 0633  AMMONIA 55* 110* 56*   Coagulation Profile: Recent Labs  Lab 12/08/23 1300  INR 1.4*   Cardiac Enzymes: Recent Labs  Lab 12/08/23 1300  CKTOTAL 106   BNP (last 3 results) No results for input(s): PROBNP in the last 8760  hours. HbA1C: No results for input(s): HGBA1C in the last 72 hours. CBG: No results for input(s): GLUCAP in the last 168 hours. Lipid Profile: No results for input(s): CHOL, HDL, LDLCALC, TRIG, CHOLHDL, LDLDIRECT in the last 72 hours. Thyroid  Function Tests: Recent Labs    12/08/23 1300  TSH 52.000*  FREET4 1.13*   Anemia Panel: No results for input(s): VITAMINB12, FOLATE, FERRITIN, TIBC, IRON , RETICCTPCT in the last 72 hours. Sepsis Labs: Recent Labs  Lab 12/08/23 1300 12/08/23 1600  LATICACIDVEN 1.8 1.6    Recent Results (from the past 240 hours)  Blood Culture (routine x 2)     Status: None (Preliminary result)   Collection Time: 12/08/23  1:00 PM   Specimen: BLOOD  Result Value Ref Range Status   Specimen Description BLOOD LEFT ANTECUBITAL  Final   Special Requests   Final    BOTTLES DRAWN AEROBIC AND ANAEROBIC Blood Culture results may not be optimal due to an inadequate volume of blood received in culture bottles   Culture   Final    NO GROWTH 2 DAYS Performed at [facility], [address], [place], [place] [address]    Report Status PENDING  Incomplete  Urine culture     Status: Abnormal   Collection Time: 12/08/23  1:19 PM   Specimen: Urine, Clean Catch  Result Value Ref Range Status   Specimen Description   Final    URINE, CLEAN CATCH Performed at [facility], [address], [place], [place] [address]    Special Requests   Final    NONE Performed at [facility], [address], [place], [place] [address]    Culture >=100,000 COLONIES/mL KLEBSIELLA PNEUMONIAE (A)  Final   Report Status 12/10/2023 FINAL  Final   Organism ID, Bacteria KLEBSIELLA PNEUMONIAE (A)  Final      Susceptibility   Klebsiella pneumoniae - MIC*    AMPICILLIN RESISTANT Resistant     CEFAZOLIN  (URINE) Value in next row Sensitive      2 SENSITIVEThis is a modified FDA-approved test that has been validated and its  performance characteristics determined by the reporting laboratory.  This laboratory is certified under the Clinical Laboratory Improvement Amendments CLIA as qualified to perform high complexity clinical laboratory testing.    CEFEPIME Value in next row Sensitive      2 SENSITIVEThis is a modified FDA-approved test that has been validated and its performance characteristics determined by the reporting laboratory.  This laboratory is certified under the Clinical Laboratory Improvement Amendments CLIA as qualified to perform high complexity clinical laboratory testing.    ERTAPENEM Value in next row Sensitive      2 SENSITIVEThis is a modified FDA-approved test that has been validated and its performance characteristics determined by the reporting laboratory.  This laboratory is certified under the Clinical Laboratory  Improvement Amendments CLIA as qualified to perform high complexity clinical laboratory testing.    CEFTRIAXONE  Value in next row Sensitive      2 SENSITIVEThis is a modified FDA-approved test that has been validated and its performance characteristics determined by the reporting laboratory.  This laboratory is certified under the Clinical Laboratory Improvement Amendments CLIA as qualified to perform high complexity clinical laboratory testing.    CIPROFLOXACIN Value in next row Sensitive      2 SENSITIVEThis is a modified FDA-approved test that has been validated and its performance characteristics determined by the reporting laboratory.  This laboratory is certified under the Clinical Laboratory Improvement Amendments CLIA as qualified to perform high complexity clinical laboratory testing.    GENTAMICIN Value in next row Sensitive      2 SENSITIVEThis is a modified FDA-approved test that has been validated and its performance characteristics determined by the reporting laboratory.  This laboratory is certified under the Clinical Laboratory Improvement Amendments CLIA as qualified to  perform high complexity clinical laboratory testing.    NITROFURANTOIN Value in next row Sensitive      2 SENSITIVEThis is a modified FDA-approved test that has been validated and its performance characteristics determined by the reporting laboratory.  This laboratory is certified under the Clinical Laboratory Improvement Amendments CLIA as qualified to perform high complexity clinical laboratory testing.    TRIMETH/SULFA Value in next row Sensitive      2 SENSITIVEThis is a modified FDA-approved test that has been validated and its performance characteristics determined by the reporting laboratory.  This laboratory is certified under the Clinical Laboratory Improvement Amendments CLIA as qualified to perform high complexity clinical laboratory testing.    AMPICILLIN/SULBACTAM Value in next row Sensitive      2 SENSITIVEThis is a modified FDA-approved test that has been validated and its performance characteristics determined by the reporting laboratory.  This laboratory is certified under the Clinical Laboratory Improvement Amendments CLIA as qualified to perform high complexity clinical laboratory testing.    PIP/TAZO Value in next row Sensitive ug/mL     <=4 SENSITIVEThis is a modified FDA-approved test that has been validated and its performance characteristics determined by the reporting laboratory.  This laboratory is certified under the Clinical Laboratory Improvement Amendments CLIA as qualified to perform high complexity clinical laboratory testing.    MEROPENEM Value in next row Sensitive      <=4 SENSITIVEThis is a modified FDA-approved test that has been validated and its performance characteristics determined by the reporting laboratory.  This laboratory is certified under the Clinical Laboratory Improvement Amendments CLIA as qualified to perform high complexity clinical laboratory testing.    * >=100,000 COLONIES/mL KLEBSIELLA PNEUMONIAE  Blood Culture (routine x 2)     Status: None  (Preliminary result)   Collection Time: 12/08/23  2:37 PM   Specimen: BLOOD RIGHT WRIST  Result Value Ref Range Status   Specimen Description BLOOD RIGHT WRIST  Final   Special Requests   Final    BOTTLES DRAWN AEROBIC AND ANAEROBIC Blood Culture adequate volume   Culture   Final    NO GROWTH 2 DAYS Performed at [facility], [address] [place], [place] [address]    Report Status PENDING  Incomplete         Radiology Studies: US  ASCITES (ABDOMEN LIMITED) Result Date: 12/08/2023 EXAM: LIMITED ABDOMINAL ULTRASOUND FOR ASCITES EVALUATION TECHNIQUE: Limited real-time sonography of all 4 quadrants of the abdomen was performed for evaluation of ascites. COMPARISON: None.  CLINICAL HISTORY: Anasarca. FINDINGS: Small volume scattered abdominal ascites. OTHER: Limited visualization of the rest of the abdomen demonstrates no acute abnormality. IMPRESSION: 1. Small volume scattered abdominal ascites. Electronically signed by: Katheleen Faes MD 12/08/2023 04:33 PM EDT RP Workstation: [id number]   CT Head Wo Contrast Result Date: 12/08/2023 CLINICAL DATA:  Delirium EXAM: CT HEAD WITHOUT CONTRAST TECHNIQUE: Contiguous axial images were obtained from the base of the skull through the vertex without intravenous contrast. RADIATION DOSE REDUCTION: This exam was performed according to the departmental dose-optimization program which includes automated exposure control, adjustment of the mA and/or kV according to patient size and/or use of iterative reconstruction technique. COMPARISON:  October 14, 2022 FINDINGS: Brain: Proportional prominence of the ventricles and sulci, consistent with diffuse cerebral parenchymal volume loss. The ventricles otherwise maintained midline position without midline shift. Gray-white differentiation is preserved.Scattered periventricular white matter hypoattenuation, most consistent with changes of mild chronic ischemic microvascular disease.No evidence of acute  territorial infarction, extra-axial fluid collection, hemorrhage, or mass lesion. The basilar cisterns are patent without downward herniation. The cerebellar hemispheres and vermis are well formed without mass lesion or focal attenuation abnormality. Vascular: No hyperdense vessel. Calcified atherosclerotic plaque within the cavernous/supraclinoid internal carotid arteries. Skull: Normal. Negative for fracture or focal lesion. Sinuses/Orbits: Minimal layering fluid in the right sphenoid sinus with bilateral mastoid effusions.The globes appear intact. No retrobulbar hematoma. Other: None. IMPRESSION: 1. No acute intracranial abnormality, specifically, no acute hemorrhage, territorial infarction, or intracranial mass. 2. Minimal layering fluid in the right sphenoid sinus with bilateral mastoid effusions. This could reflect changes of early or developing acute sinusitis. Clinical correlation requested. 3. Mild cerebral volume loss with sequelae of mild chronic ischemic microvascular disease. Electronically Signed   By: Rogelia Myers M.D.   On: 12/08/2023 14:04   DG Chest Port 1 View Result Date: 12/08/2023 CLINICAL DATA:  Possible sepsis.  Tachycardia. EXAM: PORTABLE CHEST 1 VIEW COMPARISON:  11/20/2023 FINDINGS: Poor inspiration. Allowing for that, the heart and mediastinal shadows are normal in the lungs are probably clear. No edema or effusion. IMPRESSION: Poor inspiration. No active disease suspected. Electronically Signed   By: Oneil Officer M.D.   On: 12/08/2023 13:07        Scheduled Meds:  Chlorhexidine  Gluconate Cloth  6 each Topical Daily   enoxaparin  (LOVENOX ) injection  40 mg Subcutaneous Q24H   feeding supplement  237 mL Oral TID BM   furosemide   40 mg Intravenous BID   lactulose   20 g Oral BID   levothyroxine   100 mcg Oral Daily   metoprolol  succinate  100 mg Oral Daily   multivitamin with minerals  1 tablet Oral Daily   potassium chloride   40 mEq Oral Once   spironolactone   50 mg  Oral Daily   tamsulosin   0.4 mg Oral Daily   Continuous Infusions:  cefTRIAXone  (ROCEPHIN )  IV Stopped (12/09/23 1048)     LOS: 2 days       Anthony CHRISTELLA Pouch, MD [facility] Pager [phone]  If 7PM-7AM, please contact night-coverage [email or web address] 12/10/2023, 8:36 AM

## 2023-12-11 DIAGNOSIS — K7682 Hepatic encephalopathy: Secondary | ICD-10-CM | POA: Diagnosis not present

## 2023-12-11 LAB — COMPREHENSIVE METABOLIC PANEL WITH GFR
ALT: 19 U/L (ref 0–44)
AST: 30 U/L (ref 15–41)
Albumin: 2.8 g/dL — ABNORMAL LOW (ref 3.5–5.0)
Alkaline Phosphatase: 61 U/L (ref 38–126)
Anion gap: 9 (ref 5–15)
BUN: 15 mg/dL (ref 8–23)
CO2: 24 mmol/L (ref 22–32)
Calcium: 8.4 mg/dL — ABNORMAL LOW (ref 8.9–10.3)
Chloride: 98 mmol/L (ref 98–111)
Creatinine, Ser: 0.52 mg/dL (ref 0.44–1.00)
GFR, Estimated: >60 mL/min (ref >60)
Glucose, Bld: 105 mg/dL — ABNORMAL HIGH (ref 70–99)
Potassium: 3.6 mmol/L (ref 3.5–5.1)
Sodium: 131 mmol/L — ABNORMAL LOW (ref 135–145)
Total Bilirubin: 2.3 mg/dL — ABNORMAL HIGH (ref 0.0–1.2)
Total Protein: 4.5 g/dL — ABNORMAL LOW (ref 6.5–8.1)

## 2023-12-11 LAB — CBC
HCT: 25.6 % — ABNORMAL LOW (ref 36.0–46.0)
Hemoglobin: 9 g/dL — ABNORMAL LOW (ref 12.0–15.0)
MCH: 37.5 pg — ABNORMAL HIGH (ref 26.0–34.0)
MCHC: 35.2 g/dL (ref 30.0–36.0)
MCV: 106.7 fL — ABNORMAL HIGH (ref 80.0–100.0)
Platelets: 116 K/uL — ABNORMAL LOW (ref 150–400)
RBC: 2.4 MIL/uL — ABNORMAL LOW (ref 3.87–5.11)
RDW: 17.3 % — ABNORMAL HIGH (ref 11.5–15.5)
WBC: 3.3 K/uL — ABNORMAL LOW (ref 4.0–10.5)
nRBC: 0 % (ref 0.0–0.2)

## 2023-12-11 LAB — AMMONIA: Ammonia: 61 umol/L — ABNORMAL HIGH (ref 9–35)

## 2023-12-11 MED ORDER — LACTULOSE 10 GM/15ML PO SOLN
30.0000 g | Freq: Two times a day (BID) | ORAL | Status: DC
  Administered 2023-12-11 – 2023-12-12 (×2): 30 g via ORAL
  Filled 2023-12-11 (×2): qty 60

## 2023-12-11 NOTE — Plan of Care (Signed)

## 2023-12-11 NOTE — Progress Notes (Signed)
 PROGRESS NOTE    Lauren Lawrence  FMW:[id number] DOB: 1940/09/11 DOA: 12/08/2023 PCP: [name], MD   Assessment & Plan:   Principal Problem:   Encephalopathy Active Problems:   Acute encephalopathy   Cirrhosis (HCC)   UTI (urinary tract infection)  Assessment and Plan: Acute hepatic encephalopathy: likely secondary cirrhosis. Was not taking lactulose  or rifaximin  at home as per med rec. Ammonia level is labile. Increased lactulose  dose. At baseline, pt is intermittently confused as per pt's daughter  UTI: possibly secondary to foley. Continue on IV rocephin . Urine cx growing klebsiella    Acute on chronic cirrhosis decompensation: was not taking lactulose  or rifaximin  at home as per med rec. Continue on lasix , aldactone , & rifaximin  (aldactone  & rifaximin  were started this admission)  Chronic hypoalbuminemia: likely secondary to cirrhosis    Hypothyroidism: continue on home dose of levothyroxine      Indwelling Foley catheter: failed outpatient voiding trial on 12/05/23 and another foley was placed. Continue on home dose of flomax     Chronic hyponatremia: likely secondary to cirrhosis. Labile    Moderate to severe protein calorie malnutrition: BMI 23.9. Continue on nutritional supplement    Acute ambulation impairment: likely secondary to recent hip fracture s/p ORIF. PT/OT recs apprec        DVT prophylaxis: lovenox  Code Status: DNR Family Communication: discussed pt's care w/ pt's daughter at bedside and answered her questions  Disposition Plan: possibly d/c to SNF  Level of care: Telemetry Medical  Status is: Inpatient Remains inpatient appropriate because: severity of illness    Consultants:    Procedures:   Antimicrobials:   Subjective: Pt is still only oriented to self  Objective: Vitals:   12/10/23 0746 12/10/23 1551 12/10/23 2005 12/11/23 0437  BP: (!) 118/43 (!) 102/46 (!) 122/59 (!) 111/56  Pulse: 74 74 88 74  Resp: 16 16 18 18   Temp:  98.4 F (36.9 C) 99.1 F (37.3 C) 98.9 F (37.2 C) 98.8 F (37.1 C)  TempSrc:   Oral Oral  SpO2: 99% 98% 99% 97%  Weight:      Height:        Intake/Output Summary (Last 24 hours) at 12/11/2023 0843 Last data filed at 12/11/2023 0440 Gross per 24 hour  Intake 360 ml  Output 2000 ml  Net -1640 ml   Filed Weights   12/08/23 1158 12/08/23 1638  Weight: 51.3 kg 57.4 kg    Examination:  General exam: appears confused Respiratory system: decreased breath sounds b/l  Cardiovascular system: S1 & S2+. No rubs or clicks. 2+ pitting edema of b/l LE Gastrointestinal system: abd is soft, NT, ND & hypoactive bowel sonds Central nervous system: AA&OX1. Moves all extremities Psychiatry: Judgement and insight appears poor. Flat mood and affect    Data Reviewed: I have personally reviewed following labs and imaging studies  CBC: Recent Labs  Lab 12/08/23 1300 12/10/23 0633 12/11/23 0536  WBC 7.0 3.4* 3.3*  HGB 10.6* 9.1* 9.0*  HCT 30.3* 26.1* 25.6*  MCV 107.1* 107.4* 106.7*  PLT 196 108* 116*   Basic Metabolic Panel: Recent Labs  Lab 12/08/23 1300 12/09/23 0454 12/10/23 0633 12/11/23 0536  NA 128* 129* 132* 131*  K 3.7 3.3* 3.3* 3.6  CL 97* 97* 101 98  CO2 22 21* 28 24  GLUCOSE 99 89 96 105*  BUN 15 15 16 15   CREATININE 0.60 0.61 0.55 0.52  CALCIUM 8.1* 8.4* 8.2* 8.4*   GFR: Estimated Creatinine Clearance: 44.2 mL/min (by C-G  formula based on SCr of 0.52 mg/dL). Liver Function Tests: Recent Labs  Lab 12/08/23 1300 12/09/23 0454 12/10/23 0633 12/11/23 0536  AST 45* 41 32 30  ALT 26 25 19 19   ALKPHOS 95 81 59 61  BILITOT 2.4* 2.7* 2.6* 2.3*  PROT 4.8* 5.4* 4.4* 4.5*  ALBUMIN  2.4* 3.4* 2.9* 2.8*   No results for input(s): LIPASE, AMYLASE in the last 168 hours. Recent Labs  Lab 12/08/23 1300 12/09/23 0454 12/10/23 0633 12/11/23 0536  AMMONIA 55* 110* 56* 61*   Coagulation Profile: Recent Labs  Lab 12/08/23 1300  INR 1.4*   Cardiac  Enzymes: Recent Labs  Lab 12/08/23 1300  CKTOTAL 106   BNP (last 3 results) No results for input(s): PROBNP in the last 8760 hours. HbA1C: No results for input(s): HGBA1C in the last 72 hours. CBG: No results for input(s): GLUCAP in the last 168 hours. Lipid Profile: No results for input(s): CHOL, HDL, LDLCALC, TRIG, CHOLHDL, LDLDIRECT in the last 72 hours. Thyroid  Function Tests: Recent Labs    12/08/23 1300  TSH 52.000*  FREET4 1.13*   Anemia Panel: No results for input(s): VITAMINB12, FOLATE, FERRITIN, TIBC, IRON , RETICCTPCT in the last 72 hours. Sepsis Labs: Recent Labs  Lab 12/08/23 1300 12/08/23 1600  LATICACIDVEN 1.8 1.6    Recent Results (from the past 240 hours)  Blood Culture (routine x 2)     Status: None (Preliminary result)   Collection Time: 12/08/23  1:00 PM   Specimen: BLOOD  Result Value Ref Range Status   Specimen Description BLOOD LEFT ANTECUBITAL  Final   Special Requests   Final    BOTTLES DRAWN AEROBIC AND ANAEROBIC Blood Culture results may not be optimal due to an inadequate volume of blood received in culture bottles   Culture   Final    NO GROWTH 2 DAYS Performed at [facility], [address], [place], [place] [address]    Report Status PENDING  Incomplete  Urine culture     Status: Abnormal   Collection Time: 12/08/23  1:19 PM   Specimen: Urine, Clean Catch  Result Value Ref Range Status   Specimen Description   Final    URINE, CLEAN CATCH Performed at [facility], [address] [place], [place] [address]    Special Requests   Final    NONE Performed at [facility], [address], [place], [place] [address]    Culture >=100,000 COLONIES/mL KLEBSIELLA PNEUMONIAE (A)  Final   Report Status 12/10/2023 FINAL  Final   Organism ID, Bacteria KLEBSIELLA PNEUMONIAE (A)  Final      Susceptibility   Klebsiella pneumoniae - MIC*    AMPICILLIN RESISTANT Resistant      CEFAZOLIN  (URINE) Value in next row Sensitive      2 SENSITIVEThis is a modified FDA-approved test that has been validated and its performance characteristics determined by the reporting laboratory.  This laboratory is certified under the Clinical Laboratory Improvement Amendments CLIA as qualified to perform high complexity clinical laboratory testing.    CEFEPIME Value in next row Sensitive      2 SENSITIVEThis is a modified FDA-approved test that has been validated and its performance characteristics determined by the reporting laboratory.  This laboratory is certified under the Clinical Laboratory Improvement Amendments CLIA as qualified to perform high complexity clinical laboratory testing.    ERTAPENEM Value in next row Sensitive      2 SENSITIVEThis is a modified FDA-approved test that has been validated  and its performance characteristics determined by the reporting laboratory.  This laboratory is certified under the Clinical Laboratory Improvement Amendments CLIA as qualified to perform high complexity clinical laboratory testing.    CEFTRIAXONE  Value in next row Sensitive      2 SENSITIVEThis is a modified FDA-approved test that has been validated and its performance characteristics determined by the reporting laboratory.  This laboratory is certified under the Clinical Laboratory Improvement Amendments CLIA as qualified to perform high complexity clinical laboratory testing.    CIPROFLOXACIN Value in next row Sensitive      2 SENSITIVEThis is a modified FDA-approved test that has been validated and its performance characteristics determined by the reporting laboratory.  This laboratory is certified under the Clinical Laboratory Improvement Amendments CLIA as qualified to perform high complexity clinical laboratory testing.    GENTAMICIN Value in next row Sensitive      2 SENSITIVEThis is a modified FDA-approved test that has been validated and its performance characteristics determined by the  reporting laboratory.  This laboratory is certified under the Clinical Laboratory Improvement Amendments CLIA as qualified to perform high complexity clinical laboratory testing.    NITROFURANTOIN Value in next row Sensitive      2 SENSITIVEThis is a modified FDA-approved test that has been validated and its performance characteristics determined by the reporting laboratory.  This laboratory is certified under the Clinical Laboratory Improvement Amendments CLIA as qualified to perform high complexity clinical laboratory testing.    TRIMETH/SULFA Value in next row Sensitive      2 SENSITIVEThis is a modified FDA-approved test that has been validated and its performance characteristics determined by the reporting laboratory.  This laboratory is certified under the Clinical Laboratory Improvement Amendments CLIA as qualified to perform high complexity clinical laboratory testing.    AMPICILLIN/SULBACTAM Value in next row Sensitive      2 SENSITIVEThis is a modified FDA-approved test that has been validated and its performance characteristics determined by the reporting laboratory.  This laboratory is certified under the Clinical Laboratory Improvement Amendments CLIA as qualified to perform high complexity clinical laboratory testing.    PIP/TAZO Value in next row Sensitive ug/mL     <=4 SENSITIVEThis is a modified FDA-approved test that has been validated and its performance characteristics determined by the reporting laboratory.  This laboratory is certified under the Clinical Laboratory Improvement Amendments CLIA as qualified to perform high complexity clinical laboratory testing.    MEROPENEM Value in next row Sensitive      <=4 SENSITIVEThis is a modified FDA-approved test that has been validated and its performance characteristics determined by the reporting laboratory.  This laboratory is certified under the Clinical Laboratory Improvement Amendments CLIA as qualified to perform high complexity  clinical laboratory testing.    * >=100,000 COLONIES/mL KLEBSIELLA PNEUMONIAE  Blood Culture (routine x 2)     Status: None (Preliminary result)   Collection Time: 12/08/23  2:37 PM   Specimen: BLOOD RIGHT WRIST  Result Value Ref Range Status   Specimen Description BLOOD RIGHT WRIST  Final   Special Requests   Final    BOTTLES DRAWN AEROBIC AND ANAEROBIC Blood Culture adequate volume   Culture   Final    NO GROWTH 2 DAYS Performed at [facility], [address] [place], [place] [address]    Report Status PENDING  Incomplete         Radiology Studies: No results found.       Scheduled Meds:  Chlorhexidine  Gluconate Cloth  6 each Topical Daily   enoxaparin  (LOVENOX ) injection  40 mg Subcutaneous Q24H   feeding supplement  237 mL Oral TID BM   furosemide   40 mg Intravenous BID   lactulose   20 g Oral BID   levothyroxine   100 mcg Oral Daily   metoprolol  succinate  100 mg Oral Daily   multivitamin with minerals  1 tablet Oral Daily   rifaximin   550 mg Oral BID   spironolactone   50 mg Oral Daily   tamsulosin   0.4 mg Oral Daily   Continuous Infusions:  cefTRIAXone  (ROCEPHIN )  IV 1 g (12/10/23 1132)     LOS: 3 days       Anthony CHRISTELLA Pouch, MD [facility] Pager [phone]  If 7PM-7AM, please contact night-coverage [email or web address] 12/11/2023, 8:43 AM

## 2023-12-12 DIAGNOSIS — K7682 Hepatic encephalopathy: Secondary | ICD-10-CM | POA: Diagnosis not present

## 2023-12-12 LAB — CBC
HCT: 26.3 % — ABNORMAL LOW (ref 36.0–46.0)
Hemoglobin: 9.1 g/dL — ABNORMAL LOW (ref 12.0–15.0)
MCH: 37.3 pg — ABNORMAL HIGH (ref 26.0–34.0)
MCHC: 34.6 g/dL (ref 30.0–36.0)
MCV: 107.8 fL — ABNORMAL HIGH (ref 80.0–100.0)
Platelets: 111 K/uL — ABNORMAL LOW (ref 150–400)
RBC: 2.44 MIL/uL — ABNORMAL LOW (ref 3.87–5.11)
RDW: 17.1 % — ABNORMAL HIGH (ref 11.5–15.5)
WBC: 3.2 K/uL — ABNORMAL LOW (ref 4.0–10.5)
nRBC: 0 % (ref 0.0–0.2)

## 2023-12-12 LAB — COMPREHENSIVE METABOLIC PANEL WITH GFR
ALT: 18 U/L (ref 0–44)
AST: 31 U/L (ref 15–41)
Albumin: 2.9 g/dL — ABNORMAL LOW (ref 3.5–5.0)
Alkaline Phosphatase: 69 U/L (ref 38–126)
Anion gap: 8 (ref 5–15)
BUN: 16 mg/dL (ref 8–23)
CO2: 25 mmol/L (ref 22–32)
Calcium: 8.7 mg/dL — ABNORMAL LOW (ref 8.9–10.3)
Chloride: 96 mmol/L — ABNORMAL LOW (ref 98–111)
Creatinine, Ser: 0.65 mg/dL (ref 0.44–1.00)
GFR, Estimated: >60 mL/min (ref >60)
Glucose, Bld: 111 mg/dL — ABNORMAL HIGH (ref 70–99)
Potassium: 3.6 mmol/L (ref 3.5–5.1)
Sodium: 129 mmol/L — ABNORMAL LOW (ref 135–145)
Total Bilirubin: 1.8 mg/dL — ABNORMAL HIGH (ref 0.0–1.2)
Total Protein: 4.7 g/dL — ABNORMAL LOW (ref 6.5–8.1)

## 2023-12-12 LAB — AMMONIA: Ammonia: 65 umol/L — ABNORMAL HIGH (ref 9–35)

## 2023-12-12 MED ORDER — LACTULOSE 10 GM/15ML PO SOLN
30.0000 g | Freq: Three times a day (TID) | ORAL | Status: DC
  Administered 2023-12-12 – 2023-12-13 (×4): 30 g via ORAL
  Filled 2023-12-12 (×4): qty 60

## 2023-12-12 NOTE — TOC Progression Note (Signed)
 Transition of Care [facility]) - Progression Note    Patient Details  Name: Lauren Lawrence MRN: [id number] Date of Birth: 07-May-1940  Transition of Care [facility]) CM/SW Contact  Alfonso Rummer, LCSW Phone Number: 12/12/2023, 12:21 PM  Clinical Narrative:   Lauren Lawrence Rummer met with pt and daughter Lauren Lawrence in room 106. Pt daughter provided hx and information during follow up visit with case mgmt. Pt daughter is requesting at time of discharge for [name] to return to [facility]. Updated Ball fl2 is completed along with insurance authorization Pending [id number].                       Expected Discharge Plan and Services    Expected discharge: Return to [facility] SNF                                           Social Drivers of Health (SDOH) Interventions SDOH Screenings   Food Insecurity: No Food Insecurity (12/08/2023)  Housing: Unknown (12/08/2023)  Transportation Needs: No Transportation Needs (12/08/2023)  Utilities: Not At Risk (12/08/2023)  Alcohol Screen: Low Risk  (05/14/2022)  Depression (PHQ2-9): Medium Risk (09/02/2023)  Financial Resource Strain: Low Risk  (07/05/2022)  Physical Activity: Insufficiently Active (07/05/2022)  Social Connections: Unknown (12/08/2023)  Stress: No Stress Concern Present (07/05/2022)  Tobacco Use: Low Risk  (12/08/2023)    Readmission Risk Interventions    11/21/2023   10:14 AM  Readmission Risk Prevention Plan  Post Dischage Appt Complete  Medication Screening Complete  Transportation Screening Complete

## 2023-12-12 NOTE — Progress Notes (Signed)
 PROGRESS NOTE    Lauren Lawrence  FMW:[id number] DOB: 1940-12-28 DOA: 12/08/2023 PCP: Lauren Lawrence BRAVO, MD   Assessment & Plan:   Principal Problem:   Encephalopathy Active Problems:   Acute encephalopathy   Cirrhosis (HCC)   UTI (urinary tract infection)  Assessment and Plan: Acute hepatic encephalopathy: likely secondary cirrhosis. Was not taking lactulose  or rifaximin  at home as per med rec. Only having 1 BM daily, so lactulose  dose increased. Ammonia level is labile. At baseline, pt is intermittently confused as per pt's daughter  UTI: possibly secondary to foley. Completed abx course (rocephin ). Urine cx growing klebsiella    Acute on chronic cirrhosis decompensation: was not taking lactulose  or rifaximin  at home as per med rec. Continue on home dose of rifaximin , lasix , aldactone  (aldactone , rifaximin  were started this admission)   Chronic hypoalbuminemia: likely secondary to cirrhosis    Hypothyroidism: continue on home dose of levothyroxine       Indwelling foley catheter: failed outpatient voiding trial on 12/05/23 and another foley was placed. Continue on home dose of flomax     Chronic hyponatremia: likely secondary to cirrhosis. Labile    Moderate to severe protein calorie malnutrition: BMI 23.9. Continue on nutritional supplement    Acute ambulation impairment: likely secondary to recent hip fracture s/p ORIF. PT/OT recs apprec        DVT prophylaxis: lovenox  Code Status: DNR Family Communication: discussed pt's care w/ pt's daughter at bedside and answered her questions  Disposition Plan: likely d/c back to SNF/[facility]  Level of care: Telemetry Medical  Status is: Inpatient Remains inpatient appropriate because: severity of illness    Consultants:    Procedures:   Antimicrobials:   Subjective: Pt is only oriented to self only   Objective: Vitals:   12/11/23 0940 12/11/23 1604 12/11/23 2050 12/12/23 0346  BP: (!) 119/52 (!) 113/51 (!)  131/57 (!) 118/50  Pulse: 76 73 76 73  Resp: 16 16 16 18   Temp: 98.5 F (36.9 C) 98.2 F (36.8 C) 98.4 F (36.9 C) 98.2 F (36.8 C)  TempSrc: Oral Oral    SpO2: 96% 99% 97% 97%  Weight:      Height:        Intake/Output Summary (Last 24 hours) at 12/12/2023 0843 Last data filed at 12/11/2023 2300 Gross per 24 hour  Intake 300 ml  Output 4375 ml  Net -4075 ml   Filed Weights   12/08/23 1158 12/08/23 1638  Weight: 51.3 kg 57.4 kg    Examination:  General exam: appears calm but confused  Respiratory system: diminished breath sounds b/l  Cardiovascular system: S1/S2+. +1 pitting edema of b/l LE Gastrointestinal system: abd is soft, NT, ND & hypoactive bowel sounds Central nervous system: AA&Ox1. Moves all extremities  Psychiatry: Judgement and insight appears poor. Flat mood and affect    Data Reviewed: I have personally reviewed following labs and imaging studies  CBC: Recent Labs  Lab 12/08/23 1300 12/10/23 0633 12/11/23 0536 12/12/23 0356  WBC 7.0 3.4* 3.3* 3.2*  HGB 10.6* 9.1* 9.0* 9.1*  HCT 30.3* 26.1* 25.6* 26.3*  MCV 107.1* 107.4* 106.7* 107.8*  PLT 196 108* 116* 111*   Basic Metabolic Panel: Recent Labs  Lab 12/08/23 1300 12/09/23 0454 12/10/23 0633 12/11/23 0536 12/12/23 0356  NA 128* 129* 132* 131* 129*  K 3.7 3.3* 3.3* 3.6 3.6  CL 97* 97* 101 98 96*  CO2 22 21* 28 24 25   GLUCOSE 99 89 96 105* 111*  BUN 15 15  16 15 16   CREATININE 0.60 0.61 0.55 0.52 0.65  CALCIUM 8.1* 8.4* 8.2* 8.4* 8.7*   GFR: Estimated Creatinine Clearance: 44.2 mL/min (by C-G formula based on SCr of 0.65 mg/dL). Liver Function Tests: Recent Labs  Lab 12/08/23 1300 12/09/23 0454 12/10/23 0633 12/11/23 0536 12/12/23 0356  AST 45* 41 32 30 31  ALT 26 25 19 19 18   ALKPHOS 95 81 59 61 69  BILITOT 2.4* 2.7* 2.6* 2.3* 1.8*  PROT 4.8* 5.4* 4.4* 4.5* 4.7*  ALBUMIN  2.4* 3.4* 2.9* 2.8* 2.9*   No results for input(s): LIPASE, AMYLASE in the last 168 hours. Recent  Labs  Lab 12/08/23 1300 12/09/23 0454 12/10/23 0633 12/11/23 0536 12/12/23 0356  AMMONIA 55* 110* 56* 61* 65*   Coagulation Profile: Recent Labs  Lab 12/08/23 1300  INR 1.4*   Cardiac Enzymes: Recent Labs  Lab 12/08/23 1300  CKTOTAL 106   BNP (last 3 results) No results for input(s): PROBNP in the last 8760 hours. HbA1C: No results for input(s): HGBA1C in the last 72 hours. CBG: No results for input(s): GLUCAP in the last 168 hours. Lipid Profile: No results for input(s): CHOL, HDL, LDLCALC, TRIG, CHOLHDL, LDLDIRECT in the last 72 hours. Thyroid  Function Tests: No results for input(s): TSH, T4TOTAL, FREET4, T3FREE, THYROIDAB in the last 72 hours.  Anemia Panel: No results for input(s): VITAMINB12, FOLATE, FERRITIN, TIBC, IRON , RETICCTPCT in the last 72 hours. Sepsis Labs: Recent Labs  Lab 12/08/23 1300 12/08/23 1600  LATICACIDVEN 1.8 1.6    Recent Results (from the past 240 hours)  Blood Culture (routine x 2)     Status: None (Preliminary result)   Collection Time: 12/08/23  1:00 PM   Specimen: BLOOD  Result Value Ref Range Status   Specimen Description BLOOD LEFT ANTECUBITAL  Final   Special Requests   Final    BOTTLES DRAWN AEROBIC AND ANAEROBIC Blood Culture results may not be optimal due to an inadequate volume of blood received in culture bottles   Culture   Final    NO GROWTH 4 DAYS Performed at [facility], [address], [place], [place] [address]    Report Status PENDING  Incomplete  Urine culture     Status: Abnormal   Collection Time: 12/08/23  1:19 PM   Specimen: Urine, Clean Catch  Result Value Ref Range Status   Specimen Description   Final    URINE, CLEAN CATCH Performed at [facility], [address], [place], [place] [address]    Special Requests   Final    NONE Performed at [facility], [address] [place], [place] [address]    Culture >=100,000  COLONIES/mL KLEBSIELLA PNEUMONIAE (A)  Final   Report Status 12/10/2023 FINAL  Final   Organism ID, Bacteria KLEBSIELLA PNEUMONIAE (A)  Final      Susceptibility   Klebsiella pneumoniae - MIC*    AMPICILLIN RESISTANT Resistant     CEFAZOLIN  (URINE) Value in next row Sensitive      2 SENSITIVEThis is a modified FDA-approved test that has been validated and its performance characteristics determined by the reporting laboratory.  This laboratory is certified under the Clinical Laboratory Improvement Amendments CLIA as qualified to perform high complexity clinical laboratory testing.    CEFEPIME Value in next row Sensitive      2 SENSITIVEThis is a modified FDA-approved test that has been validated and its performance characteristics determined by the reporting laboratory.  This laboratory is certified under the Clinical  Laboratory Improvement Amendments CLIA as qualified to perform high complexity clinical laboratory testing.    ERTAPENEM Value in next row Sensitive      2 SENSITIVEThis is a modified FDA-approved test that has been validated and its performance characteristics determined by the reporting laboratory.  This laboratory is certified under the Clinical Laboratory Improvement Amendments CLIA as qualified to perform high complexity clinical laboratory testing.    CEFTRIAXONE  Value in next row Sensitive      2 SENSITIVEThis is a modified FDA-approved test that has been validated and its performance characteristics determined by the reporting laboratory.  This laboratory is certified under the Clinical Laboratory Improvement Amendments CLIA as qualified to perform high complexity clinical laboratory testing.    CIPROFLOXACIN Value in next row Sensitive      2 SENSITIVEThis is a modified FDA-approved test that has been validated and its performance characteristics determined by the reporting laboratory.  This laboratory is certified under the Clinical Laboratory Improvement Amendments CLIA as  qualified to perform high complexity clinical laboratory testing.    GENTAMICIN Value in next row Sensitive      2 SENSITIVEThis is a modified FDA-approved test that has been validated and its performance characteristics determined by the reporting laboratory.  This laboratory is certified under the Clinical Laboratory Improvement Amendments CLIA as qualified to perform high complexity clinical laboratory testing.    NITROFURANTOIN Value in next row Sensitive      2 SENSITIVEThis is a modified FDA-approved test that has been validated and its performance characteristics determined by the reporting laboratory.  This laboratory is certified under the Clinical Laboratory Improvement Amendments CLIA as qualified to perform high complexity clinical laboratory testing.    TRIMETH/SULFA Value in next row Sensitive      2 SENSITIVEThis is a modified FDA-approved test that has been validated and its performance characteristics determined by the reporting laboratory.  This laboratory is certified under the Clinical Laboratory Improvement Amendments CLIA as qualified to perform high complexity clinical laboratory testing.    AMPICILLIN/SULBACTAM Value in next row Sensitive      2 SENSITIVEThis is a modified FDA-approved test that has been validated and its performance characteristics determined by the reporting laboratory.  This laboratory is certified under the Clinical Laboratory Improvement Amendments CLIA as qualified to perform high complexity clinical laboratory testing.    PIP/TAZO Value in next row Sensitive ug/mL     <=4 SENSITIVEThis is a modified FDA-approved test that has been validated and its performance characteristics determined by the reporting laboratory.  This laboratory is certified under the Clinical Laboratory Improvement Amendments CLIA as qualified to perform high complexity clinical laboratory testing.    MEROPENEM Value in next row Sensitive      <=4 SENSITIVEThis is a modified  FDA-approved test that has been validated and its performance characteristics determined by the reporting laboratory.  This laboratory is certified under the Clinical Laboratory Improvement Amendments CLIA as qualified to perform high complexity clinical laboratory testing.    * >=100,000 COLONIES/mL KLEBSIELLA PNEUMONIAE  Blood Culture (routine x 2)     Status: None (Preliminary result)   Collection Time: 12/08/23  2:37 PM   Specimen: BLOOD RIGHT WRIST  Result Value Ref Range Status   Specimen Description BLOOD RIGHT WRIST  Final   Special Requests   Final    BOTTLES DRAWN AEROBIC AND ANAEROBIC Blood Culture adequate volume   Culture   Final    NO GROWTH 4 DAYS Performed at [facility], [address]  [address] [place], [place] [address]    Report Status PENDING  Incomplete         Radiology Studies: No results found.       Scheduled Meds:  Chlorhexidine  Gluconate Cloth  6 each Topical Daily   enoxaparin  (LOVENOX ) injection  40 mg Subcutaneous Q24H   feeding supplement  237 mL Oral TID BM   furosemide   40 mg Intravenous BID   lactulose   30 g Oral BID   levothyroxine   100 mcg Oral Daily   metoprolol  succinate  100 mg Oral Daily   multivitamin with minerals  1 tablet Oral Daily   rifaximin   550 mg Oral BID   spironolactone   50 mg Oral Daily   tamsulosin   0.4 mg Oral Daily   Continuous Infusions:  cefTRIAXone  (ROCEPHIN )  IV 1 g (12/12/23 0830)     LOS: 4 days       Anthony CHRISTELLA Pouch, MD [facility] Pager [phone]  If 7PM-7AM, please contact night-coverage [email or web address] 12/12/2023, 8:43 AM

## 2023-12-12 NOTE — [place] FL2 (Signed)
 [place]  MEDICAID FL2 LEVEL OF CARE FORM     IDENTIFICATION  Patient Name: Lauren Lawrence Birthdate: Apr 24, 1940 Sex: female Admission Date (Current Location): 12/08/2023  [facility] and [place] Number:  Chiropodist and Address:  [facility], [address] [place], [place] [address]      Provider Number: [id number]  Attending Physician Name and Address:  Trudy Anthony HERO, MD  Relative Name and Phone Number:  Shawneen Deetz [phone]    Current Level of Care: Hospital Recommended Level of Care: Skilled Nursing Facility Prior Approval Number:    Date Approved/Denied:   PASRR Number: [id number]  Discharge Plan: SNF    Current Diagnoses: Patient Active Problem List   Diagnosis Date Noted   Acute encephalopathy 12/08/2023   Cirrhosis (HCC) 12/08/2023   UTI (urinary tract infection) 12/08/2023   Encephalopathy 12/08/2023   Protein-calorie malnutrition, severe 11/22/2023   Displaced intertrochanteric fracture of right femur, initial encounter for closed fracture (HCC) 11/21/2023   Hypothyroidism 11/21/2023   Essential hypertension 11/21/2023   Elevated LFTs 11/21/2023   Hyponatremia 11/21/2023   Chronic back pain 11/21/2023   Displaced fracture of left femoral neck (HCC) 10/14/2022   Hypertensive urgency 10/14/2022   Dementia without behavioral disturbance (HCC) 10/14/2022   Iron  deficiency anemia 05/14/2022   Maculopapular rash, localized 07/03/2021   Memory loss 05/11/2021   Thrombocytopenia (HCC) 07/10/2019   Skin trauma 07/10/2019   Allergic rhinitis 10/17/2014   Cyst of thyroid  10/17/2014   Clinical depression 10/17/2014   Essential (primary) hypertension 10/17/2014   Fatty infiltration of liver 10/17/2014   Acquired hypothyroidism 10/17/2014   Leukopenia 10/17/2014   OP (osteoporosis) 10/17/2014   Enlargement of spleen 10/17/2014   Phlebectasia 10/17/2014   Avitaminosis D 10/17/2014   HLD (hyperlipidemia) 07/25/2014     Orientation RESPIRATION BLADDER Height & Weight     Self  Normal External catheter Weight: 126 lb 8.7 oz (57.4 kg) Height:  5' 1 (154.9 cm)  BEHAVIORAL SYMPTOMS/MOOD NEUROLOGICAL BOWEL NUTRITION STATUS      Continent Diet (regular)  AMBULATORY STATUS COMMUNICATION OF NEEDS Skin   Extensive Assist Verbally Normal                       Personal Care Assistance Level of Assistance  Bathing, Feeding, Dressing Bathing Assistance: Limited assistance Feeding assistance: Limited assistance Dressing Assistance: Limited assistance     Functional Limitations Info  Sight, Hearing, Speech Sight Info: Impaired Hearing Info: Adequate Speech Info: Adequate    SPECIAL CARE FACTORS FREQUENCY  OT (By licensed OT)       OT Frequency: 5 times weekley            Contractures      Additional Factors Info                  Current Medications (12/12/2023):  This is the current hospital active medication list Current Facility-Administered Medications  Medication Dose Route Frequency Provider Last Rate Last Admin   acetaminophen  (TYLENOL ) tablet 650 mg  650 mg Oral Q4H PRN Laurita Cort DASEN, MD       Chlorhexidine  Gluconate Cloth 2 % PADS 6 each  6 each Topical Daily Trudy Anthony HERO, MD   6 each at 12/12/23 9188   enoxaparin  (LOVENOX ) injection 40 mg  40 mg Subcutaneous Q24H Laurita Cort T, MD   40 mg at 12/11/23 2145   feeding supplement (ENSURE PLUS HIGH PROTEIN) liquid 237 mL  237 mL Oral  TID BM Trudy Anthony HERO, MD   237 mL at 12/12/23 9188   furosemide  (LASIX ) injection 40 mg  40 mg Intravenous BID Laurita Manor T, MD   40 mg at 12/12/23 0813   lactulose  (CHRONULAC ) 10 GM/15ML solution 30 g  30 g Oral TID Trudy Anthony HERO, MD       levothyroxine  (SYNTHROID ) tablet 100 mcg  100 mcg Oral Daily Laurita Manor T, MD   100 mcg at 12/12/23 9358   metoprolol  succinate (TOPROL -XL) 24 hr tablet 100 mg  100 mg Oral Daily Laurita Manor T, MD   100 mg at 12/12/23 0813   multivitamin  with minerals tablet 1 tablet  1 tablet Oral Daily Trudy Anthony HERO, MD   1 tablet at 12/12/23 0813   ondansetron  (ZOFRAN ) tablet 4 mg  4 mg Oral Q6H PRN Laurita Manor DASEN, MD       Or   ondansetron  (ZOFRAN ) injection 4 mg  4 mg Intravenous Q6H PRN Laurita Manor T, MD       rifaximin  (XIFAXAN ) tablet 550 mg  550 mg Oral BID Trudy Anthony HERO, MD   550 mg at 12/12/23 0813   senna-docusate (Senokot-S) tablet 1 tablet  1 tablet Oral QHS PRN Laurita Manor DASEN, MD       spironolactone  (ALDACTONE ) tablet 50 mg  50 mg Oral Daily Zhang, Ping T, MD   50 mg at 12/12/23 9186   tamsulosin  (FLOMAX ) capsule 0.4 mg  0.4 mg Oral Daily Laurita Manor T, MD   0.4 mg at 12/11/23 1758     Discharge Medications: Please see discharge summary for a list of discharge medications.  Relevant Imaging Results:  Relevant Lab Results:   Additional Information ssn [id number]  Alfonso Rummer, LCSW

## 2023-12-12 NOTE — TOC Progression Note (Addendum)
 Transition of Care [facility]) - Progression Note    Patient Details  Name: Lauren Lawrence MRN: [id number] Date of Birth: 09/10/1940  Transition of Care [facility]) CM/SW Contact  Dalia GORMAN Fuse, RN Phone Number: 12/12/2023, 10:38 AM  Clinical Narrative:     Therapy recommends SNF, per MD the patient has been to [facility] previously and her daughter would like for her to go back there. TOC outreached to Britney at [facility] and she is from [facility]. The patient will need an updated FL2 and ins auth.  TOC will continue to follow.                    Expected Discharge Plan and Services                                               Social Drivers of Health (SDOH) Interventions SDOH Screenings   Food Insecurity: No Food Insecurity (12/08/2023)  Housing: Unknown (12/08/2023)  Transportation Needs: No Transportation Needs (12/08/2023)  Utilities: Not At Risk (12/08/2023)  Alcohol Screen: Low Risk  (05/14/2022)  Depression (PHQ2-9): Medium Risk (09/02/2023)  Financial Resource Strain: Low Risk  (07/05/2022)  Physical Activity: Insufficiently Active (07/05/2022)  Social Connections: Unknown (12/08/2023)  Stress: No Stress Concern Present (07/05/2022)  Tobacco Use: Low Risk  (12/08/2023)    Readmission Risk Interventions    11/21/2023   10:14 AM  Readmission Risk Prevention Plan  Post Dischage Appt Complete  Medication Screening Complete  Transportation Screening Complete

## 2023-12-12 NOTE — Care Management Important Message (Signed)
 Important Message  Patient Details  Name: Lauren Lawrence MRN: [id number] Date of Birth: October 12, 1940   Important Message Given:  Yes - Medicare IM     Gioia Ranes W, CMA 12/12/2023, 11:16 AM

## 2023-12-13 DIAGNOSIS — I10 Essential (primary) hypertension: Secondary | ICD-10-CM | POA: Diagnosis not present

## 2023-12-13 DIAGNOSIS — E46 Unspecified protein-calorie malnutrition: Secondary | ICD-10-CM | POA: Diagnosis not present

## 2023-12-13 DIAGNOSIS — S72141D Displaced intertrochanteric fracture of right femur, subsequent encounter for closed fracture with routine healing: Secondary | ICD-10-CM | POA: Diagnosis not present

## 2023-12-13 DIAGNOSIS — R339 Retention of urine, unspecified: Secondary | ICD-10-CM | POA: Diagnosis not present

## 2023-12-13 DIAGNOSIS — K7682 Hepatic encephalopathy: Secondary | ICD-10-CM | POA: Diagnosis not present

## 2023-12-13 DIAGNOSIS — E8809 Other disorders of plasma-protein metabolism, not elsewhere classified: Secondary | ICD-10-CM | POA: Diagnosis not present

## 2023-12-13 DIAGNOSIS — E7800 Pure hypercholesterolemia, unspecified: Secondary | ICD-10-CM | POA: Diagnosis not present

## 2023-12-13 DIAGNOSIS — E871 Hypo-osmolality and hyponatremia: Secondary | ICD-10-CM | POA: Diagnosis not present

## 2023-12-13 DIAGNOSIS — F0390 Unspecified dementia without behavioral disturbance: Secondary | ICD-10-CM | POA: Diagnosis not present

## 2023-12-13 DIAGNOSIS — K7030 Alcoholic cirrhosis of liver without ascites: Secondary | ICD-10-CM | POA: Diagnosis not present

## 2023-12-13 DIAGNOSIS — S79929A Unspecified injury of unspecified thigh, initial encounter: Secondary | ICD-10-CM | POA: Diagnosis not present

## 2023-12-13 DIAGNOSIS — F03A3 Unspecified dementia, mild, with mood disturbance: Secondary | ICD-10-CM | POA: Diagnosis not present

## 2023-12-13 DIAGNOSIS — E43 Unspecified severe protein-calorie malnutrition: Secondary | ICD-10-CM | POA: Diagnosis not present

## 2023-12-13 DIAGNOSIS — R338 Other retention of urine: Secondary | ICD-10-CM | POA: Diagnosis not present

## 2023-12-13 DIAGNOSIS — S72002A Fracture of unspecified part of neck of left femur, initial encounter for closed fracture: Secondary | ICD-10-CM | POA: Diagnosis not present

## 2023-12-13 DIAGNOSIS — W19XXXD Unspecified fall, subsequent encounter: Secondary | ICD-10-CM | POA: Diagnosis not present

## 2023-12-13 DIAGNOSIS — K7460 Unspecified cirrhosis of liver: Secondary | ICD-10-CM | POA: Diagnosis not present

## 2023-12-13 DIAGNOSIS — Z743 Need for continuous supervision: Secondary | ICD-10-CM | POA: Diagnosis not present

## 2023-12-13 DIAGNOSIS — E039 Hypothyroidism, unspecified: Secondary | ICD-10-CM | POA: Diagnosis not present

## 2023-12-13 LAB — CBC
HCT: 30.9 % — ABNORMAL LOW (ref 36.0–46.0)
Hemoglobin: 10.6 g/dL — ABNORMAL LOW (ref 12.0–15.0)
MCH: 37.1 pg — ABNORMAL HIGH (ref 26.0–34.0)
MCHC: 34.3 g/dL (ref 30.0–36.0)
MCV: 108 fL — ABNORMAL HIGH (ref 80.0–100.0)
Platelets: 130 K/uL — ABNORMAL LOW (ref 150–400)
RBC: 2.86 MIL/uL — ABNORMAL LOW (ref 3.87–5.11)
RDW: 17 % — ABNORMAL HIGH (ref 11.5–15.5)
WBC: 3.6 K/uL — ABNORMAL LOW (ref 4.0–10.5)
nRBC: 0 % (ref 0.0–0.2)

## 2023-12-13 LAB — COMPREHENSIVE METABOLIC PANEL WITH GFR
ALT: 19 U/L (ref 0–44)
AST: 28 U/L (ref 15–41)
Albumin: 2.9 g/dL — ABNORMAL LOW (ref 3.5–5.0)
Alkaline Phosphatase: 72 U/L (ref 38–126)
Anion gap: 7 (ref 5–15)
BUN: 17 mg/dL (ref 8–23)
CO2: 29 mmol/L (ref 22–32)
Calcium: 9 mg/dL (ref 8.9–10.3)
Chloride: 98 mmol/L (ref 98–111)
Creatinine, Ser: 0.58 mg/dL (ref 0.44–1.00)
GFR, Estimated: >60 mL/min (ref >60)
Glucose, Bld: 112 mg/dL — ABNORMAL HIGH (ref 70–99)
Potassium: 3.7 mmol/L (ref 3.5–5.1)
Sodium: 134 mmol/L — ABNORMAL LOW (ref 135–145)
Total Bilirubin: 1.7 mg/dL — ABNORMAL HIGH (ref 0.0–1.2)
Total Protein: 5 g/dL — ABNORMAL LOW (ref 6.5–8.1)

## 2023-12-13 LAB — CULTURE, BLOOD (ROUTINE X 2)
Culture: NO GROWTH
Culture: NO GROWTH
Special Requests: ADEQUATE

## 2023-12-13 LAB — AMMONIA: Ammonia: 25 umol/L (ref 9–35)

## 2023-12-13 MED ORDER — LACTULOSE 10 GM/15ML PO SOLN: 30.0000 g | Freq: Three times a day (TID) | ORAL | 0 refills | Status: AC

## 2023-12-13 MED ORDER — RIFAXIMIN 550 MG PO TABS: 550.0000 mg | ORAL_TABLET | Freq: Two times a day (BID) | ORAL | 0 refills | Status: AC

## 2023-12-13 MED ORDER — SPIRONOLACTONE 50 MG PO TABS: 50.0000 mg | ORAL_TABLET | Freq: Every day | ORAL | 0 refills | Status: AC

## 2023-12-13 NOTE — Plan of Care (Signed)
  Problem: Education: Goal: Knowledge of General Education information will improve Description: Including pain rating scale, medication(s)/side effects and non-pharmacologic comfort measures Outcome: Progressing   Problem: Health Behavior/Discharge Planning: Goal: Ability to manage health-related needs will improve Outcome: Progressing   Problem: Clinical Measurements: Goal: Ability to maintain clinical measurements within normal limits will improve Outcome: Progressing Goal: Will remain free from infection Outcome: Progressing Goal: Respiratory complications will improve Outcome: Progressing Goal: Cardiovascular complication will be avoided Outcome: Progressing   Problem: Elimination: Goal: Will not experience complications related to bowel motility Outcome: Progressing   Problem: Safety: Goal: Ability to remain free from injury will improve Outcome: Progressing

## 2023-12-13 NOTE — Discharge Summary (Signed)
 Physician Discharge Summary  Lauren Lawrence FMW:[id number] DOB: 08/23/1940 DOA: 12/08/2023  PCP: [name], MD  Admit date: 12/08/2023 Discharge date: 12/13/2023  Admitted From: SNF Disposition:  SNF  Recommendations for Outpatient Follow-up:  Follow up with PCP in 1-2 weeks F/u w/ GI at previously scheduled appointment Will need repeat thyroid  levels in 4-6 weeks  F/u w/ urology w/in 1 week for voiding trial   Home Health: no  Equipment/Devices:  Discharge Condition: stable CODE STATUS:DNR Diet recommendation: Dysphagia II: Gravies added; Yogurt TID meals  Brief/Interim Summary: HPI was taken from Dr. Laurita: Lauren Lawrence is a 83 y.o. female with medical history significant of HTN, HLD, frequent falls with recent hip fracture status post ORIF, cirrhosis, memory loss, moderate to severe protein calorie malnutrition, acute urinary retention on Foley catheter, presented with worsening of confusion, anasarca and hyponatremia.   Patient is confused and unable to provide any history, all history provided by daughter at bedside.  Patient was recently hospitalized after a fall and hip fracture 2 weeks - 10 days ago, patient received full resuscitation during last hospitalization, her weight has significant increased from baseline 95 pounds to 117 pounds in the microblade week.  Subsequently she was discharged to rehab center.  She continued to suffer from anasarca, and eventually she was started on Lasix  3 days ago.  However family only saw a modest decrease of weight from 117 pound to 113 pounds.  Moreover, during last admission patient developed acute urinary retention and a Foley was placed in.  Earlier this week, patient did void trial however she failed and another Foley was placed in.  Family visited patient on Monday and found patient very sleepy and more confused compared to her baseline.  But denied there was not any diarrhea cough or fever or chills.  In addition, daughter also reported  the patient appeared to have significant deconditioning and pain associated with any movement resulted in the severe decrease of daily activity and PT time. ED Course: Temperature 99.1 pulse 90 blood pressure 130/56 O2 saturation 98% room air.  CT head negative for acute findings.  Blood work showed WBC 7.0 hemoglobin 10.6, platelet 196, sodium 128, potassium 3.7, BUN 15 creatinine 0.6 albumin  2.4 AST 45 ALT 26 total bilirubin 2.4.  UA showed WBCs 3+, RBC 1+.  Ammonium level 55.   Patient was given ceftriaxone  x 1 in the ED  Discharge Diagnoses:  Principal Problem:   Encephalopathy Active Problems:   Acute encephalopathy   Cirrhosis (HCC)   UTI (urinary tract infection)  Acute hepatic encephalopathy: likely secondary cirrhosis. Was not taking lactulose  or rifaximin  at home as per med rec. Continue on lactulose , rifaximin . Ammonia level is labile. At baseline, pt is intermittently confused as per pt's daughter   UTI: possibly secondary to foley. Completed abx course (rocephin ). Urine cx growing klebsiella    Acute on chronic cirrhosis decompensation: was not taking lactulose  or rifaximin  at home as per med rec. Continue on on rifaximin , lasix , aldactone  (aldactone , rifaximin  were started this admission). Will need to f/u outpatient w/ GI at previously scheduled appt   Chronic hypoalbuminemia: likely secondary to cirrhosis    Hypothyroidism: continue on home dose of levothyroxine . Elevated TSH, FT4, so repeat levels in 4-6 weeks    Urinary retention: failed outpatient voiding trial on 12/05/23 and another foley was placed. Continue on home dose of flomax . Will need f/u w/ urology for a voiding trial    Chronic hyponatremia: likely secondary to cirrhosis. Labile  Moderate to severe protein calorie malnutrition: BMI 23.9. Continue on nutritional supplement    Acute ambulation impairment: likely secondary to recent hip fracture s/p ORIF. PT/OT recs apprec   Discharge  Instructions  Discharge Instructions     Diet general   Complete by: As directed    Dysphagia II diet: Gravies added; Yogurt TID meals   Discharge instructions   Complete by: As directed    F/u w/ PCP in 1-2 weeks. F/u w/ GI at previously scheduled appointment. Will need repeat thyroid  levels checked in 4-6 weeks by your PCP.   Discharge instructions   Complete by: As directed    F/u w/ urology within 1 week for voiding trial   Increase activity slowly   Complete by: As directed    No wound care   Complete by: As directed    No wound care   Complete by: As directed       Allergies as of 12/13/2023       Reactions   Cat Dander Other (See Comments)   Sinus issues        Medication List     STOP taking these medications    Iron  325 (65 Fe) MG Tabs       TAKE these medications    acetaminophen  325 MG tablet Commonly known as: TYLENOL  Take 650 mg by mouth every 4 (four) hours as needed. General discomfort   Coenzyme Q10 10 MG capsule Take 10 mg by mouth daily.   docusate sodium  100 MG capsule Commonly known as: COLACE Take 1 capsule (100 mg total) by mouth 2 (two) times daily.   enoxaparin  30 MG/0.3ML injection Commonly known as: LOVENOX  Inject 0.3 mLs (30 mg total) into the skin daily for 14 days.   feeding supplement Liqd Take 237 mLs by mouth 2 (two) times daily between meals.   furosemide  40 MG tablet Commonly known as: LASIX  Take 40 mg by mouth daily.   lactulose  10 GM/15ML solution Commonly known as: CHRONULAC  Take 45 mLs (30 g total) by mouth 3 (three) times daily.   levothyroxine  100 MCG tablet Commonly known as: SYNTHROID  Take 1 tablet (100 mcg total) by mouth daily.   lidocaine  4 % Place 1 patch onto the skin every 12 (twelve) hours. Lower back   megestrol  400 MG/10ML suspension Commonly known as: MEGACE  Take 10 mLs (400 mg total) by mouth daily.   metoprolol  succinate 100 MG 24 hr tablet Commonly known as: TOPROL -XL Take 1 tablet  (100 mg total) by mouth daily. Take with or immediately following a meal.   multivitamin with minerals Tabs tablet Take 1 tablet by mouth daily.   ondansetron  4 MG tablet Commonly known as: ZOFRAN  Take 1 tablet (4 mg total) by mouth every 6 (six) hours as needed for nausea.   phenol 1.4 % Liqd Commonly known as: CHLORASEPTIC Use as directed 1 spray in the mouth or throat as needed for throat irritation / pain.   rifaximin  550 MG Tabs tablet Commonly known as: XIFAXAN  Take 1 tablet (550 mg total) by mouth 2 (two) times daily.   risedronate  35 MG tablet Commonly known as: Actonel  Take 1 tablet (35 mg total) by mouth every 7 (seven) days. with water on empty stomach, nothing by mouth or lie down for next 30 minutes.   sodium chloride  1 g tablet Take 1 g by mouth daily. For 7 days   spironolactone  50 MG tablet Commonly known as: ALDACTONE  Take 1 tablet (50 mg total) by mouth  daily. Start taking on: December 14, 2023   tamsulosin  0.4 MG Caps capsule Commonly known as: FLOMAX  Take 1 capsule (0.4 mg total) by mouth daily.        Follow-up Information     Fisher, Nancyann BRAVO, MD Follow up.   Specialty: Family Medicine Why: hospital follow up Contact information: [address] [place] [address] [phone]                Allergies  Allergen Reactions   Cat Dander Other (See Comments)    Sinus issues    Consultations:    Procedures/Studies: US  ASCITES (ABDOMEN LIMITED) Result Date: 12/08/2023 EXAM: LIMITED ABDOMINAL ULTRASOUND FOR ASCITES EVALUATION TECHNIQUE: Limited real-time sonography of all 4 quadrants of the abdomen was performed for evaluation of ascites. COMPARISON: None. CLINICAL HISTORY: Anasarca. FINDINGS: Small volume scattered abdominal ascites. OTHER: Limited visualization of the rest of the abdomen demonstrates no acute abnormality. IMPRESSION: 1. Small volume scattered abdominal ascites. Electronically signed by: Katheleen Faes MD  12/08/2023 04:33 PM EDT RP Workstation: [id number]   CT Head Wo Contrast Result Date: 12/08/2023 CLINICAL DATA:  Delirium EXAM: CT HEAD WITHOUT CONTRAST TECHNIQUE: Contiguous axial images were obtained from the base of the skull through the vertex without intravenous contrast. RADIATION DOSE REDUCTION: This exam was performed according to the departmental dose-optimization program which includes automated exposure control, adjustment of the mA and/or kV according to patient size and/or use of iterative reconstruction technique. COMPARISON:  October 14, 2022 FINDINGS: Brain: Proportional prominence of the ventricles and sulci, consistent with diffuse cerebral parenchymal volume loss. The ventricles otherwise maintained midline position without midline shift. Gray-white differentiation is preserved.Scattered periventricular white matter hypoattenuation, most consistent with changes of mild chronic ischemic microvascular disease.No evidence of acute territorial infarction, extra-axial fluid collection, hemorrhage, or mass lesion. The basilar cisterns are patent without downward herniation. The cerebellar hemispheres and vermis are well formed without mass lesion or focal attenuation abnormality. Vascular: No hyperdense vessel. Calcified atherosclerotic plaque within the cavernous/supraclinoid internal carotid arteries. Skull: Normal. Negative for fracture or focal lesion. Sinuses/Orbits: Minimal layering fluid in the right sphenoid sinus with bilateral mastoid effusions.The globes appear intact. No retrobulbar hematoma. Other: None. IMPRESSION: 1. No acute intracranial abnormality, specifically, no acute hemorrhage, territorial infarction, or intracranial mass. 2. Minimal layering fluid in the right sphenoid sinus with bilateral mastoid effusions. This could reflect changes of early or developing acute sinusitis. Clinical correlation requested. 3. Mild cerebral volume loss with sequelae of mild chronic ischemic  microvascular disease. Electronically Signed   By: Rogelia Myers M.D.   On: 12/08/2023 14:04   DG Chest Port 1 View Result Date: 12/08/2023 CLINICAL DATA:  Possible sepsis.  Tachycardia. EXAM: PORTABLE CHEST 1 VIEW COMPARISON:  11/20/2023 FINDINGS: Poor inspiration. Allowing for that, the heart and mediastinal shadows are normal in the lungs are probably clear. No edema or effusion. IMPRESSION: Poor inspiration. No active disease suspected. Electronically Signed   By: Oneil Officer M.D.   On: 12/08/2023 13:07   DG HIP UNILAT W OR W/O PELVIS 2-3 VIEWS RIGHT Result Date: 11/22/2023 CLINICAL DATA:  Hip fracture, postop. EXAM: DG HIP (WITH OR WITHOUT PELVIS) 2-3V RIGHT COMPARISON:  Preoperative imaging FINDINGS: Femoral intramedullary nail with trans trochanteric and distal locking and screw fixation traverse proximal femur fracture. Improved fracture alignment from preoperative imaging. Recent postsurgical change includes air and edema in the soft tissues. Left hip arthroplasty is partially included in the field of view. IMPRESSION: ORIF of proximal femur fracture without  immediate postoperative complication. Electronically Signed   By: Andrea Gasman M.D.   On: 11/22/2023 16:15   DG HIP UNILAT WITH PELVIS 2-3 VIEWS RIGHT Result Date: 11/22/2023 CLINICAL DATA:  Intertrochanteric right femur fracture. EXAM: DG HIP (WITH OR WITHOUT PELVIS) 2-3V RIGHT COMPARISON:  11/20/2023 FINDINGS: Eight fluoroscopic spot views of the right hip submitted from the operating room. Femoral intramedullary nail with trans trochanteric and distal locking screw fixation traverse intertrochanteric femur fracture. Fluoroscopy time 2 minutes 34 seconds, dose 28.34 mGy. IMPRESSION: Fluoroscopic spot views during ORIF of right intertrochanteric femur fracture. Electronically Signed   By: Andrea Gasman M.D.   On: 11/22/2023 16:14   DG C-Arm 1-60 Min-No Report Result Date: 11/22/2023 Fluoroscopy was utilized by the requesting  physician.  No radiographic interpretation.   DG Chest 1 View Result Date: 11/20/2023 CLINICAL DATA:  Status post fall. EXAM: CHEST  1 VIEW COMPARISON:  Sep 06, 2023 FINDINGS: The heart size and mediastinal contours are within normal limits. Mild, stable, diffuse, chronic appearing increased interstitial lung markings are seen. No acute infiltrate, pleural effusion or pneumothorax is identified. A compression fracture deformity of indeterminate age is seen at the level of L1. IMPRESSION: 1. Chronic appearing increased interstitial lung markings without acute or active cardiopulmonary disease. 2. L1 vertebral body compression fracture of indeterminate age. Correlation with physical examination is recommended to determine the presence of point tenderness. Electronically Signed   By: Suzen Dials M.D.   On: 11/20/2023 23:24   DG Hip Unilat  With Pelvis 2-3 Views Right Result Date: 11/20/2023 CLINICAL DATA:  Status post fall. EXAM: DG HIP (WITH OR WITHOUT PELVIS) 2-3V RIGHT COMPARISON:  None Available. FINDINGS: An acute fracture deformity is seen extending through the inter trochanteric region of the proximal right femur. There is mild dorsal angulation of the fracture apex without evidence of dislocation. An intact left hip replacement is noted. IMPRESSION: Acute intertrochanteric fracture of the proximal right femur. Electronically Signed   By: Suzen Dials M.D.   On: 11/20/2023 23:22   (Echo, Carotid, EGD, Colonoscopy, ERCP)    Subjective: Pt c/o fatigue   Discharge Exam: Vitals:   12/13/23 0357 12/13/23 0759  BP: (!) 141/52 (!) 146/61  Pulse: 72 77  Resp: 14 18  Temp: 98.4 F (36.9 C) 98 F (36.7 C)  SpO2: 100% 99%   Vitals:   12/12/23 1954 12/12/23 2359 12/13/23 0357 12/13/23 0759  BP: (!) 124/54 (!) 134/53 (!) 141/52 (!) 146/61  Pulse: 76 78 72 77  Resp:   14 18  Temp: 98.7 F (37.1 C) 98.6 F (37 C) 98.4 F (36.9 C) 98 F (36.7 C)  TempSrc: Oral Oral    SpO2: 99% 100%  100% 99%  Weight:      Height:        General: Pt is alert, awake, not in acute distress Cardiovascular: S1/S2 +, no rubs, no gallops Respiratory: CTA bilaterally, no wheezing, no rhonchi Abdominal: Soft, NT, ND, bowel sounds + Extremities: +1 b/l LE pitting edema, no cyanosis    The results of significant diagnostics from this hospitalization (including imaging, microbiology, ancillary and laboratory) are listed below for reference.     Microbiology: Recent Results (from the past 240 hours)  Blood Culture (routine x 2)     Status: None   Collection Time: 12/08/23  1:00 PM   Specimen: BLOOD  Result Value Ref Range Status   Specimen Description BLOOD LEFT ANTECUBITAL  Final   Special Requests   Final  BOTTLES DRAWN AEROBIC AND ANAEROBIC Blood Culture results may not be optimal due to an inadequate volume of blood received in culture bottles   Culture   Final    NO GROWTH 5 DAYS Performed at [facility], [address] [place], [place] [address]    Report Status 12/13/2023 FINAL  Final  Urine culture     Status: Abnormal   Collection Time: 12/08/23  1:19 PM   Specimen: Urine, Clean Catch  Result Value Ref Range Status   Specimen Description   Final    URINE, CLEAN CATCH Performed at [facility], [address], [place], [place] [address]    Special Requests   Final    NONE Performed at [facility], [address], [place], [place] [address]    Culture >=100,000 COLONIES/mL KLEBSIELLA PNEUMONIAE (A)  Final   Report Status 12/10/2023 FINAL  Final   Organism ID, Bacteria KLEBSIELLA PNEUMONIAE (A)  Final      Susceptibility   Klebsiella pneumoniae - MIC*    AMPICILLIN RESISTANT Resistant     CEFAZOLIN  (URINE) Value in next row Sensitive      2 SENSITIVEThis is a modified FDA-approved test that has been validated and its performance characteristics determined by the reporting laboratory.  This laboratory is certified under the Clinical  Laboratory Improvement Amendments CLIA as qualified to perform high complexity clinical laboratory testing.    CEFEPIME Value in next row Sensitive      2 SENSITIVEThis is a modified FDA-approved test that has been validated and its performance characteristics determined by the reporting laboratory.  This laboratory is certified under the Clinical Laboratory Improvement Amendments CLIA as qualified to perform high complexity clinical laboratory testing.    ERTAPENEM Value in next row Sensitive      2 SENSITIVEThis is a modified FDA-approved test that has been validated and its performance characteristics determined by the reporting laboratory.  This laboratory is certified under the Clinical Laboratory Improvement Amendments CLIA as qualified to perform high complexity clinical laboratory testing.    CEFTRIAXONE  Value in next row Sensitive      2 SENSITIVEThis is a modified FDA-approved test that has been validated and its performance characteristics determined by the reporting laboratory.  This laboratory is certified under the Clinical Laboratory Improvement Amendments CLIA as qualified to perform high complexity clinical laboratory testing.    CIPROFLOXACIN Value in next row Sensitive      2 SENSITIVEThis is a modified FDA-approved test that has been validated and its performance characteristics determined by the reporting laboratory.  This laboratory is certified under the Clinical Laboratory Improvement Amendments CLIA as qualified to perform high complexity clinical laboratory testing.    GENTAMICIN Value in next row Sensitive      2 SENSITIVEThis is a modified FDA-approved test that has been validated and its performance characteristics determined by the reporting laboratory.  This laboratory is certified under the Clinical Laboratory Improvement Amendments CLIA as qualified to perform high complexity clinical laboratory testing.    NITROFURANTOIN Value in next row Sensitive      2 SENSITIVEThis  is a modified FDA-approved test that has been validated and its performance characteristics determined by the reporting laboratory.  This laboratory is certified under the Clinical Laboratory Improvement Amendments CLIA as qualified to perform high complexity clinical laboratory testing.    TRIMETH/SULFA Value in next row Sensitive      2 SENSITIVEThis is a modified FDA-approved test that has been validated and its performance characteristics  determined by the reporting laboratory.  This laboratory is certified under the Clinical Laboratory Improvement Amendments CLIA as qualified to perform high complexity clinical laboratory testing.    AMPICILLIN/SULBACTAM Value in next row Sensitive      2 SENSITIVEThis is a modified FDA-approved test that has been validated and its performance characteristics determined by the reporting laboratory.  This laboratory is certified under the Clinical Laboratory Improvement Amendments CLIA as qualified to perform high complexity clinical laboratory testing.    PIP/TAZO Value in next row Sensitive ug/mL     <=4 SENSITIVEThis is a modified FDA-approved test that has been validated and its performance characteristics determined by the reporting laboratory.  This laboratory is certified under the Clinical Laboratory Improvement Amendments CLIA as qualified to perform high complexity clinical laboratory testing.    MEROPENEM Value in next row Sensitive      <=4 SENSITIVEThis is a modified FDA-approved test that has been validated and its performance characteristics determined by the reporting laboratory.  This laboratory is certified under the Clinical Laboratory Improvement Amendments CLIA as qualified to perform high complexity clinical laboratory testing.    * >=100,000 COLONIES/mL KLEBSIELLA PNEUMONIAE  Blood Culture (routine x 2)     Status: None   Collection Time: 12/08/23  2:37 PM   Specimen: BLOOD RIGHT WRIST  Result Value Ref Range Status   Specimen Description  BLOOD RIGHT WRIST  Final   Special Requests   Final    BOTTLES DRAWN AEROBIC AND ANAEROBIC Blood Culture adequate volume   Culture   Final    NO GROWTH 5 DAYS Performed at [facility], [address] [place], [place] [address]    Report Status 12/13/2023 FINAL  Final     Labs: BNP (last 3 results) Recent Labs    12/08/23 1300  BNP 66.7   Basic Metabolic Panel: Recent Labs  Lab 12/09/23 0454 12/10/23 0633 12/11/23 0536 12/12/23 0356 12/13/23 0411  NA 129* 132* 131* 129* 134*  K 3.3* 3.3* 3.6 3.6 3.7  CL 97* 101 98 96* 98  CO2 21* 28 24 25 29   GLUCOSE 89 96 105* 111* 112*  BUN 15 16 15 16 17   CREATININE 0.61 0.55 0.52 0.65 0.58  CALCIUM 8.4* 8.2* 8.4* 8.7* 9.0   Liver Function Tests: Recent Labs  Lab 12/09/23 0454 12/10/23 0633 12/11/23 0536 12/12/23 0356 12/13/23 0411  AST 41 32 30 31 28   ALT 25 19 19 18 19   ALKPHOS 81 59 61 69 72  BILITOT 2.7* 2.6* 2.3* 1.8* 1.7*  PROT 5.4* 4.4* 4.5* 4.7* 5.0*  ALBUMIN  3.4* 2.9* 2.8* 2.9* 2.9*   No results for input(s): LIPASE, AMYLASE in the last 168 hours. Recent Labs  Lab 12/09/23 0454 12/10/23 0633 12/11/23 0536 12/12/23 0356 12/13/23 0411  AMMONIA 110* 56* 61* 65* 25   CBC: Recent Labs  Lab 12/08/23 1300 12/10/23 0633 12/11/23 0536 12/12/23 0356 12/13/23 0411  WBC 7.0 3.4* 3.3* 3.2* 3.6*  HGB 10.6* 9.1* 9.0* 9.1* 10.6*  HCT 30.3* 26.1* 25.6* 26.3* 30.9*  MCV 107.1* 107.4* 106.7* 107.8* 108.0*  PLT 196 108* 116* 111* 130*   Cardiac Enzymes: Recent Labs  Lab 12/08/23 1300  CKTOTAL 106   BNP: Invalid input(s): POCBNP CBG: No results for input(s): GLUCAP in the last 168 hours. D-Dimer No results for input(s): DDIMER in the last 72 hours. Hgb A1c No results for input(s): HGBA1C in the last 72 hours. Lipid Profile No results for input(s): CHOL, HDL, LDLCALC, TRIG, CHOLHDL, LDLDIRECT  in the last 72 hours. Thyroid  function studies No results for input(s): TSH,  T4TOTAL, T3FREE, THYROIDAB in the last 72 hours.  Invalid input(s): FREET3 Anemia work up No results for input(s): VITAMINB12, FOLATE, FERRITIN, TIBC, IRON , RETICCTPCT in the last 72 hours. Urinalysis    Component Value Date/Time   COLORURINE AMBER (A) 12/08/2023 1319   APPEARANCEUR CLOUDY (A) 12/08/2023 1319   LABSPEC 1.009 12/08/2023 1319   PHURINE 7.0 12/08/2023 1319   GLUCOSEU NEGATIVE 12/08/2023 1319   HGBUR SMALL (A) 12/08/2023 1319   BILIRUBINUR NEGATIVE 12/08/2023 1319   KETONESUR NEGATIVE 12/08/2023 1319   PROTEINUR NEGATIVE 12/08/2023 1319   NITRITE NEGATIVE 12/08/2023 1319   LEUKOCYTESUR LARGE (A) 12/08/2023 1319   Sepsis Labs Recent Labs  Lab 12/10/23 9366 12/11/23 0536 12/12/23 0356 12/13/23 0411  WBC 3.4* 3.3* 3.2* 3.6*   Microbiology Recent Results (from the past 240 hours)  Blood Culture (routine x 2)     Status: None   Collection Time: 12/08/23  1:00 PM   Specimen: BLOOD  Result Value Ref Range Status   Specimen Description BLOOD LEFT ANTECUBITAL  Final   Special Requests   Final    BOTTLES DRAWN AEROBIC AND ANAEROBIC Blood Culture results may not be optimal due to an inadequate volume of blood received in culture bottles   Culture   Final    NO GROWTH 5 DAYS Performed at [facility], [address], [place], [place] [address]    Report Status 12/13/2023 FINAL  Final  Urine culture     Status: Abnormal   Collection Time: 12/08/23  1:19 PM   Specimen: Urine, Clean Catch  Result Value Ref Range Status   Specimen Description   Final    URINE, CLEAN CATCH Performed at [facility], [address], [place], [place] [address]    Special Requests   Final    NONE Performed at [facility], [address] [place], [place] [address]    Culture >=100,000 COLONIES/mL KLEBSIELLA PNEUMONIAE (A)  Final   Report Status 12/10/2023 FINAL  Final   Organism ID, Bacteria KLEBSIELLA PNEUMONIAE (A)  Final       Susceptibility   Klebsiella pneumoniae - MIC*    AMPICILLIN RESISTANT Resistant     CEFAZOLIN  (URINE) Value in next row Sensitive      2 SENSITIVEThis is a modified FDA-approved test that has been validated and its performance characteristics determined by the reporting laboratory.  This laboratory is certified under the Clinical Laboratory Improvement Amendments CLIA as qualified to perform high complexity clinical laboratory testing.    CEFEPIME Value in next row Sensitive      2 SENSITIVEThis is a modified FDA-approved test that has been validated and its performance characteristics determined by the reporting laboratory.  This laboratory is certified under the Clinical Laboratory Improvement Amendments CLIA as qualified to perform high complexity clinical laboratory testing.    ERTAPENEM Value in next row Sensitive      2 SENSITIVEThis is a modified FDA-approved test that has been validated and its performance characteristics determined by the reporting laboratory.  This laboratory is certified under the Clinical Laboratory Improvement Amendments CLIA as qualified to perform high complexity clinical laboratory testing.    CEFTRIAXONE  Value in next row Sensitive      2 SENSITIVEThis is a modified FDA-approved test that has been validated and its performance characteristics determined by the reporting laboratory.  This laboratory is certified under the Clinical Laboratory Improvement Amendments CLIA as qualified  to perform high complexity clinical laboratory testing.    CIPROFLOXACIN Value in next row Sensitive      2 SENSITIVEThis is a modified FDA-approved test that has been validated and its performance characteristics determined by the reporting laboratory.  This laboratory is certified under the Clinical Laboratory Improvement Amendments CLIA as qualified to perform high complexity clinical laboratory testing.    GENTAMICIN Value in next row Sensitive      2 SENSITIVEThis is a modified  FDA-approved test that has been validated and its performance characteristics determined by the reporting laboratory.  This laboratory is certified under the Clinical Laboratory Improvement Amendments CLIA as qualified to perform high complexity clinical laboratory testing.    NITROFURANTOIN Value in next row Sensitive      2 SENSITIVEThis is a modified FDA-approved test that has been validated and its performance characteristics determined by the reporting laboratory.  This laboratory is certified under the Clinical Laboratory Improvement Amendments CLIA as qualified to perform high complexity clinical laboratory testing.    TRIMETH/SULFA Value in next row Sensitive      2 SENSITIVEThis is a modified FDA-approved test that has been validated and its performance characteristics determined by the reporting laboratory.  This laboratory is certified under the Clinical Laboratory Improvement Amendments CLIA as qualified to perform high complexity clinical laboratory testing.    AMPICILLIN/SULBACTAM Value in next row Sensitive      2 SENSITIVEThis is a modified FDA-approved test that has been validated and its performance characteristics determined by the reporting laboratory.  This laboratory is certified under the Clinical Laboratory Improvement Amendments CLIA as qualified to perform high complexity clinical laboratory testing.    PIP/TAZO Value in next row Sensitive ug/mL     <=4 SENSITIVEThis is a modified FDA-approved test that has been validated and its performance characteristics determined by the reporting laboratory.  This laboratory is certified under the Clinical Laboratory Improvement Amendments CLIA as qualified to perform high complexity clinical laboratory testing.    MEROPENEM Value in next row Sensitive      <=4 SENSITIVEThis is a modified FDA-approved test that has been validated and its performance characteristics determined by the reporting laboratory.  This laboratory is certified under  the Clinical Laboratory Improvement Amendments CLIA as qualified to perform high complexity clinical laboratory testing.    * >=100,000 COLONIES/mL KLEBSIELLA PNEUMONIAE  Blood Culture (routine x 2)     Status: None   Collection Time: 12/08/23  2:37 PM   Specimen: BLOOD RIGHT WRIST  Result Value Ref Range Status   Specimen Description BLOOD RIGHT WRIST  Final   Special Requests   Final    BOTTLES DRAWN AEROBIC AND ANAEROBIC Blood Culture adequate volume   Culture   Final    NO GROWTH 5 DAYS Performed at [facility], [address], [place], [place] [address]    Report Status 12/13/2023 FINAL  Final     Time coordinating discharge: 35 minutes  SIGNED:   Anthony CHRISTELLA Pouch, MD  [facility] 12/13/2023, 1:20 PM Pager   If 7PM-7AM, please contact night-coverage [email or web address]

## 2023-12-13 NOTE — Progress Notes (Signed)
 Physical Therapy Treatment Patient Details Name: Lauren Lawrence MRN: [id number] DOB: Sep 23, 1940 Today's Date: 12/13/2023   History of Present Illness Pt. is a 83 y.o. female with medical history significant of HTN, HLD, frequent falls with recent hip fracture status post R ORIF (8/5), cirrhosis, memory loss, moderate to severe protein calorie malnutrition, acute urinary retention on Foley catheter, presented with worsening of confusion, anasarca and hyponatremia    PT Comments  Pt requiring perianal care d/t BM while in bed. Pt demonstrates rolling with mod A. Able to initiate half of the roll, but requires tactile and verbal cuing to reach for the bed rails and achieve full roll. Once on her side, she requires assistance with maintaining the position. Would benefit from skilled PT to address above deficits and promote optimal return to PLOF.    If plan is discharge home, recommend the following: A lot of help with walking and/or transfers;A lot of help with bathing/dressing/bathroom;Assist for transportation;Help with stairs or ramp for entrance;Supervision due to cognitive status   Can travel by private vehicle     No  Equipment Recommendations  Other (comment) (TBD at next venue of care)    Recommendations for Other Services       Precautions / Restrictions Precautions Precautions: Fall Recall of Precautions/Restrictions: Impaired Restrictions Weight Bearing Restrictions Per Provider Order: Yes RLE Weight Bearing Per Provider Order: Weight bearing as tolerated (Prior ORIF from 11/22/2023)     Mobility  Bed Mobility Overal bed mobility: Needs Assistance Bed Mobility: Rolling Rolling: Mod assist         General bed mobility comments: Pt able to initiate roll halfway however needed assistance with the full range reaching the bed rail. Once on her side, she requires assistance with staying on her side.    Transfers Overall transfer level: Needs assistance                  General transfer comment: NT d/t requiring perianal care after BM in bed.    Ambulation/Gait               General Gait Details: NT d/t requiring perianal care after BM in bed.   Stairs             Wheelchair Mobility     Tilt Bed    Modified Rankin (Stroke Patients Only)       Balance Overall balance assessment: Needs assistance     Sitting balance - Comments: NT d/t requiring perianal care after BM in bed.       Standing balance comment: NT d/t requiring perianal care after BM in bed.                            Communication Communication Communication: Impaired Factors Affecting Communication: Hearing impaired;Reduced clarity of speech (speaks very quietly making it hard to hear her)  Cognition Arousal: Lethargic Behavior During Therapy: Flat affect   PT - Cognitive impairments: History of cognitive impairments                       PT - Cognition Comments: Pt is agreeable to therapy session. Daughters present during session. Following commands: Impaired Following commands impaired: Follows one step commands with increased time    Cueing Cueing Techniques: Verbal cues, Visual cues, Tactile cues  Exercises Other Exercises Other Exercises: linen and gown change    General Comments  Pertinent Vitals/Pain Pain Assessment Pain Assessment: No/denies pain    Home Living                          Prior Function            PT Goals (current goals can now be found in the care plan section) Acute Rehab PT Goals Patient Stated Goal: none stated PT Goal Formulation: Patient unable to participate in goal setting Time For Goal Achievement: 12/24/23 Potential to Achieve Goals: Good Progress towards PT goals: Progressing toward goals    Frequency    Min 3X/week      PT Plan      Co-evaluation              AM-PAC PT 6 Clicks Mobility   Outcome Measure  Help needed turning from your back  to your side while in a flat bed without using bedrails?: A Lot Help needed moving from lying on your back to sitting on the side of a flat bed without using bedrails?: A Lot Help needed moving to and from a bed to a chair (including a wheelchair)?: Total Help needed standing up from a chair using your arms (e.g., wheelchair or bedside chair)?: A Lot Help needed to walk in hospital room?: Total Help needed climbing 3-5 steps with a railing? : Total 6 Click Score: 9    End of Session   Activity Tolerance: Patient limited by fatigue Patient left: in bed;with bed alarm set;with family/visitor present;with call bell/phone within reach Nurse Communication: Mobility status PT Visit Diagnosis: Unsteadiness on feet (R26.81);Muscle weakness (generalized) (M62.81);Other abnormalities of gait and mobility (R26.89)     Time: 9087-9059 PT Time Calculation (min) (ACUTE ONLY): 28 min  Charges:                            Pretty Weltman, SPT    Lauren Lawrence 12/13/2023, 10:52 AM

## 2023-12-13 NOTE — TOC Transition Note (Signed)
 Transition of Care [facility]) - Discharge Note   Patient Details  Name: Lauren Lawrence MRN: [id number] Date of Birth: 21-May-1940  Transition of Care [facility]) CM/SW Contact:  Alfonso Rummer, LCSW Phone Number: 12/13/2023, 3:14 PM   Clinical Narrative:    [name] Rummer met with pt and daughter in room 106A. Transportation to [facility] confirmed via [employer]. Pt will discharge to [facility] room 602.          Patient Goals and CMS Choice            Discharge Placement                 [facility]       Discharge Plan and Services Additional resources added to the After Visit Summary for                                       Social Drivers of Health (SDOH) Interventions SDOH Screenings   Food Insecurity: No Food Insecurity (12/08/2023)  Housing: Unknown (12/08/2023)  Transportation Needs: No Transportation Needs (12/08/2023)  Utilities: Not At Risk (12/08/2023)  Alcohol Screen: Low Risk  (05/14/2022)  Depression (PHQ2-9): Medium Risk (09/02/2023)  Financial Resource Strain: Low Risk  (07/05/2022)  Physical Activity: Insufficiently Active (07/05/2022)  Social Connections: Unknown (12/08/2023)  Stress: No Stress Concern Present (07/05/2022)  Tobacco Use: Low Risk  (12/08/2023)     Readmission Risk Interventions    11/21/2023   10:14 AM  Readmission Risk Prevention Plan  Post Dischage Appt Complete  Medication Screening Complete  Transportation Screening Complete

## 2023-12-13 NOTE — Progress Notes (Signed)
 Occupational Therapy Treatment Patient Details Name: Lauren Lawrence MRN: [id number] DOB: 18-Oct-1940 Today's Date: 12/13/2023   History of present illness Pt. is a 83 y.o. female with medical history significant of HTN, HLD, frequent falls with recent hip fracture status post R ORIF (8/5), cirrhosis, memory loss, moderate to severe protein calorie malnutrition, acute urinary retention on Foley catheter, presented with worsening of confusion, anasarca and hyponatremia   OT comments  Pt seen for OT treatment on this date. Upon arrival to room pt supine in bed with HOB elevated, agreeable to tx. Pt completed grooming tasks at bed level and requires verbal, gestural, and tactile cuing for bed mobility and supine to sit EOB. Completed UB dressing at EOB with Min A to don hospital gown.  Multimodal cuing for hand placement with sit to stand transfers with Min A to RW level and completed side steps at RW level.  At end of session, returned to supine in bed with Max A.   Pt making good progress toward goals, will continue to follow POC. Discharge recommendation remains appropriate.  Per pt's daughter who is present at bedside, pt may d/c to SNF today.       If plan is discharge home, recommend the following:  A lot of help with walking and/or transfers;A lot of help with bathing/dressing/bathroom;Assistance with cooking/housework;Assistance with feeding;Assist for transportation;Supervision due to cognitive status;Direct supervision/assist for financial management   Equipment Recommendations       Recommendations for Other Services      Precautions / Restrictions Precautions Precautions: Fall Recall of Precautions/Restrictions: Impaired Restrictions Weight Bearing Restrictions Per Provider Order: Yes RLE Weight Bearing Per Provider Order: Weight bearing as tolerated Other Position/Activity Restrictions: from prior hip fx, and ORIF repair 11/22/23.       Mobility Bed Mobility Overal bed mobility:  Needs Assistance Bed Mobility: Rolling Rolling: Mod assist   Supine to sit: Max assist Sit to supine: Max assist, HOB elevated, Used rails   General bed mobility comments: Pt able to initiate roll halfway however needed assistance with the full range reaching the bed rail.    Transfers Overall transfer level: Needs assistance Equipment used: Rolling walker (2 wheels) Transfers: Sit to/from Stand Sit to Stand: Min assist, From elevated surface           General transfer comment: Min A sit to stand with cuing for hand placement and Min A for side-steps toward HOB at RW level     Balance Overall balance assessment: Needs assistance Sitting-balance support: Feet supported Sitting balance-Leahy Scale: Fair Sitting balance - Comments: Donned hospital gown while seated EOB   Standing balance support: Bilateral upper extremity supported, During functional activity, Reliant on assistive device for balance Standing balance-Leahy Scale: Poor                             ADL either performed or assessed with clinical judgement   ADL Overall ADL's : Needs assistance/impaired     Grooming: Wash/dry face;Bed level           Upper Body Dressing : Minimal assistance;Sitting       Toilet Transfer: Rolling walker (2 wheels);Minimal assistance Toilet Transfer Details (indicate cue type and reason): Simulation                Extremity/Trunk Assessment Upper Extremity Assessment Upper Extremity Assessment: Generalized weakness       Cervical / Trunk Assessment Cervical / Trunk Assessment: Kyphotic  Vision       Perception     Praxis     Communication Communication Communication: Impaired Factors Affecting Communication: Hearing impaired;Reduced clarity of speech   Cognition Arousal: Lethargic Behavior During Therapy: Flat affect Cognition: Cognition impaired   Orientation impairments: Person                           Following  commands: Impaired Following commands impaired: Follows one step commands with increased time      Cueing   Cueing Techniques: Verbal cues, Visual cues, Tactile cues  Exercises      Shoulder Instructions       General Comments      Pertinent Vitals/ Pain       Pain Assessment Pain Assessment: Faces Faces Pain Scale: Hurts little more Facial Expression: facial grimacing Pain Location: RLE with movement Pain Descriptors / Indicators: Grimacing Pain Intervention(s): Monitored during session, Repositioned  Home Living                                          Prior Functioning/Environment              Frequency  Min 2X/week        Progress Toward Goals  OT Goals(current goals can now be found in the care plan section)  Progress towards OT goals: Progressing toward goals     Plan      Co-evaluation                 AM-PAC OT 6 Clicks Daily Activity     Outcome Measure   Help from another person eating meals?: A Little Help from another person taking care of personal grooming?: A Little Help from another person toileting, which includes using toliet, bedpan, or urinal?: A Lot Help from another person bathing (including washing, rinsing, drying)?: A Lot Help from another person to put on and taking off regular upper body clothing?: A Lot Help from another person to put on and taking off regular lower body clothing?: Total 6 Click Score: 13    End of Session Equipment Utilized During Treatment: Gait belt;Rolling walker (2 wheels)  OT Visit Diagnosis: Unsteadiness on feet (R26.81);Muscle weakness (generalized) (M62.81)   Activity Tolerance Patient limited by lethargy   Patient Left in bed;with bed alarm set;with call bell/phone within reach;with family/visitor present   Nurse Communication Mobility status        Time: 8840-8770 OT Time Calculation (min): 30 min  Charges: OT General Charges $OT Visit: 1 Visit OT  Treatments $Self Care/Home Management : 23-37 mins  Harlene Sharps OTR/L   Harlene LITTIE Sharps 12/13/2023, 12:51 PM

## 2023-12-14 ENCOUNTER — Telehealth: Payer: Self-pay | Admitting: Physician Assistant

## 2023-12-14 DIAGNOSIS — F0390 Unspecified dementia without behavioral disturbance: Secondary | ICD-10-CM | POA: Diagnosis not present

## 2023-12-14 DIAGNOSIS — R339 Retention of urine, unspecified: Secondary | ICD-10-CM | POA: Diagnosis not present

## 2023-12-14 DIAGNOSIS — E871 Hypo-osmolality and hyponatremia: Secondary | ICD-10-CM | POA: Diagnosis not present

## 2023-12-14 DIAGNOSIS — E46 Unspecified protein-calorie malnutrition: Secondary | ICD-10-CM | POA: Diagnosis not present

## 2023-12-14 DIAGNOSIS — K7682 Hepatic encephalopathy: Secondary | ICD-10-CM | POA: Diagnosis not present

## 2023-12-14 DIAGNOSIS — E8809 Other disorders of plasma-protein metabolism, not elsewhere classified: Secondary | ICD-10-CM | POA: Diagnosis not present

## 2023-12-14 DIAGNOSIS — K7460 Unspecified cirrhosis of liver: Secondary | ICD-10-CM | POA: Diagnosis not present

## 2023-12-14 DIAGNOSIS — E039 Hypothyroidism, unspecified: Secondary | ICD-10-CM | POA: Diagnosis not present

## 2023-12-14 DIAGNOSIS — S72141D Displaced intertrochanteric fracture of right femur, subsequent encounter for closed fracture with routine healing: Secondary | ICD-10-CM | POA: Diagnosis not present

## 2023-12-14 NOTE — Telephone Encounter (Signed)
 She is scheduled for new patient visit with ED follow-up/voiding trial with me on 12/27/2023 at 3:20 PM.  She is currently at [facility].  Okay for [employer] to remove her Foley catheter early that morning, around 6 AM.  She may see me that afternoon for PVR.  I recommend she drink about 24 ounces of fluid following Foley removal and void as needed.

## 2023-12-16 DIAGNOSIS — E871 Hypo-osmolality and hyponatremia: Secondary | ICD-10-CM | POA: Diagnosis not present

## 2023-12-16 DIAGNOSIS — R339 Retention of urine, unspecified: Secondary | ICD-10-CM | POA: Diagnosis not present

## 2023-12-16 DIAGNOSIS — E039 Hypothyroidism, unspecified: Secondary | ICD-10-CM | POA: Diagnosis not present

## 2023-12-16 DIAGNOSIS — E46 Unspecified protein-calorie malnutrition: Secondary | ICD-10-CM | POA: Diagnosis not present

## 2023-12-16 DIAGNOSIS — K7682 Hepatic encephalopathy: Secondary | ICD-10-CM | POA: Diagnosis not present

## 2023-12-16 DIAGNOSIS — E8809 Other disorders of plasma-protein metabolism, not elsewhere classified: Secondary | ICD-10-CM | POA: Diagnosis not present

## 2023-12-16 DIAGNOSIS — K7460 Unspecified cirrhosis of liver: Secondary | ICD-10-CM | POA: Diagnosis not present

## 2023-12-16 DIAGNOSIS — S72141D Displaced intertrochanteric fracture of right femur, subsequent encounter for closed fracture with routine healing: Secondary | ICD-10-CM | POA: Diagnosis not present

## 2023-12-16 DIAGNOSIS — F0390 Unspecified dementia without behavioral disturbance: Secondary | ICD-10-CM | POA: Diagnosis not present

## 2023-12-20 DIAGNOSIS — E871 Hypo-osmolality and hyponatremia: Secondary | ICD-10-CM | POA: Diagnosis not present

## 2023-12-20 DIAGNOSIS — K7682 Hepatic encephalopathy: Secondary | ICD-10-CM | POA: Diagnosis not present

## 2023-12-20 DIAGNOSIS — E039 Hypothyroidism, unspecified: Secondary | ICD-10-CM | POA: Diagnosis not present

## 2023-12-20 DIAGNOSIS — S72141D Displaced intertrochanteric fracture of right femur, subsequent encounter for closed fracture with routine healing: Secondary | ICD-10-CM | POA: Diagnosis not present

## 2023-12-20 DIAGNOSIS — F0390 Unspecified dementia without behavioral disturbance: Secondary | ICD-10-CM | POA: Diagnosis not present

## 2023-12-20 DIAGNOSIS — E8809 Other disorders of plasma-protein metabolism, not elsewhere classified: Secondary | ICD-10-CM | POA: Diagnosis not present

## 2023-12-20 DIAGNOSIS — K7460 Unspecified cirrhosis of liver: Secondary | ICD-10-CM | POA: Diagnosis not present

## 2023-12-20 DIAGNOSIS — E46 Unspecified protein-calorie malnutrition: Secondary | ICD-10-CM | POA: Diagnosis not present

## 2023-12-20 DIAGNOSIS — R339 Retention of urine, unspecified: Secondary | ICD-10-CM | POA: Diagnosis not present

## 2023-12-22 ENCOUNTER — Ambulatory Visit: Admitting: Orthopedic Surgery

## 2023-12-22 ENCOUNTER — Other Ambulatory Visit (INDEPENDENT_AMBULATORY_CARE_PROVIDER_SITE_OTHER): Payer: Self-pay

## 2023-12-22 ENCOUNTER — Encounter: Payer: Self-pay | Admitting: Orthopedic Surgery

## 2023-12-22 DIAGNOSIS — S72002A Fracture of unspecified part of neck of left femur, initial encounter for closed fracture: Secondary | ICD-10-CM | POA: Diagnosis not present

## 2023-12-22 DIAGNOSIS — K7682 Hepatic encephalopathy: Secondary | ICD-10-CM | POA: Diagnosis not present

## 2023-12-22 DIAGNOSIS — E43 Unspecified severe protein-calorie malnutrition: Secondary | ICD-10-CM | POA: Diagnosis not present

## 2023-12-22 DIAGNOSIS — E039 Hypothyroidism, unspecified: Secondary | ICD-10-CM | POA: Diagnosis not present

## 2023-12-22 DIAGNOSIS — R338 Other retention of urine: Secondary | ICD-10-CM | POA: Diagnosis not present

## 2023-12-22 DIAGNOSIS — K7030 Alcoholic cirrhosis of liver without ascites: Secondary | ICD-10-CM | POA: Diagnosis not present

## 2023-12-22 DIAGNOSIS — F0390 Unspecified dementia without behavioral disturbance: Secondary | ICD-10-CM | POA: Diagnosis not present

## 2023-12-22 DIAGNOSIS — S72001D Fracture of unspecified part of neck of right femur, subsequent encounter for closed fracture with routine healing: Secondary | ICD-10-CM

## 2023-12-22 NOTE — Progress Notes (Signed)
 POST OP VISIT   Patient: Lauren Lawrence           Date of Birth: 1941/02/25           MRN: [id number] Visit Date: 12/22/2023 Requested by: [name], MD [address] [place],  [place] [address] PCP: [name], MD   Encounter Diagnoses  Name Primary?   Displaced fracture of left femoral neck (HCC) 10/14/22 bipolar replacement Yes   Closed fracture of right hip with routine healing, subsequent encounter 11/22/23 IM nail Right hip    PROCEDURE: Cephalomedullary nail right hip November 22, 2023  Bipolar replacement left hip October 14, 2022  Patient is doing well according to daughter and physical therapist  Leg lengths are equal.  No pain right leg incision looks clean dry intact at 4 weeks  X-rays look good  Follow-up in 8 weeks for x-rays right hip  Chief Complaint  Patient presents with   Post-op Follow-up    Right hip fracture August 10/07/2023  Left hip fracture October 14, 2022    Allergies  Allergen Reactions   Cat Dander Other (See Comments)    Sinus issues      Current Outpatient Medications:    acetaminophen  (TYLENOL ) 325 MG tablet, Take 650 mg by mouth every 4 (four) hours as needed. General discomfort, Disp: , Rfl:    Coenzyme Q10 10 MG capsule, Take 10 mg by mouth daily., Disp: , Rfl:    docusate sodium  (COLACE) 100 MG capsule, Take 1 capsule (100 mg total) by mouth 2 (two) times daily., Disp: 10 capsule, Rfl: 0   enoxaparin  (LOVENOX ) 30 MG/0.3ML injection, Inject 0.3 mLs (30 mg total) into the skin daily for 14 days., Disp: 4.2 mL, Rfl: 0   feeding supplement (ENSURE PLUS HIGH PROTEIN) LIQD, Take 237 mLs by mouth 2 (two) times daily between meals., Disp: 14220 mL, Rfl: 0   furosemide  (LASIX ) 40 MG tablet, Take 40 mg by mouth daily., Disp: , Rfl:    lactulose  (CHRONULAC ) 10 GM/15ML solution, Take 45 mLs (30 g total) by mouth 3 (three) times daily., Disp: 4050 mL, Rfl: 0   levothyroxine  (SYNTHROID ) 100 MCG tablet, Take 1 tablet (100 mcg  total) by mouth daily., Disp: 90 tablet, Rfl: 1   lidocaine  4 %, Place 1 patch onto the skin every 12 (twelve) hours. Lower back, Disp: , Rfl:    megestrol  (MEGACE ) 400 MG/10ML suspension, Take 10 mLs (400 mg total) by mouth daily., Disp: 240 mL, Rfl: 0   metoprolol  succinate (TOPROL -XL) 100 MG 24 hr tablet, Take 1 tablet (100 mg total) by mouth daily. Take with or immediately following a meal., Disp: 90 tablet, Rfl: 2   Multiple Vitamin (MULTIVITAMIN WITH MINERALS) TABS tablet, Take 1 tablet by mouth daily., Disp: , Rfl:    ondansetron  (ZOFRAN ) 4 MG tablet, Take 1 tablet (4 mg total) by mouth every 6 (six) hours as needed for nausea., Disp: 20 tablet, Rfl: 0   phenol (CHLORASEPTIC) 1.4 % LIQD, Use as directed 1 spray in the mouth or throat as needed for throat irritation / pain., Disp: 118 mL, Rfl: 0   rifaximin  (XIFAXAN ) 550 MG TABS tablet, Take 1 tablet (550 mg total) by mouth 2 (two) times daily., Disp: 60 tablet, Rfl: 0   risedronate  (ACTONEL ) 35 MG tablet, Take 1 tablet (35 mg total) by mouth every 7 (seven) days. with water on empty stomach, nothing by mouth or lie down for next 30 minutes.,  Disp: 12 tablet, Rfl: 4   sodium chloride  1 g tablet, Take 1 g by mouth daily. For 7 days, Disp: , Rfl:    spironolactone  (ALDACTONE ) 50 MG tablet, Take 1 tablet (50 mg total) by mouth daily., Disp: 30 tablet, Rfl: 0   tamsulosin  (FLOMAX ) 0.4 MG CAPS capsule, Take 1 capsule (0.4 mg total) by mouth daily., Disp: 30 capsule, Rfl: 1     DG HIP UNILAT WITH PELVIS 2-3 VIEWS LEFT Result Date: 12/22/2023 AP pelvis AP lateral left hip Bipolar replacement left hip looks stable Right hip fracture looks like it is healing well implants in good position    I have reviewed the patient's history and given the presence of a fragility fracture, I have deemed the necessity of a osteoporosis management referral or confirmed that the patient is currently enrolled in a osteoporosis treatment program.  Per [facility] clinic  policy, our goal is ensure optimal postoperative pain control with a multimodal pain management strategy. For all [facility] patients, our goal is to wean post-operative narcotic medications by 6 weeks post-operatively. If this is not possible due to utilization of pain medication prior to surgery, your [facility] doctor will support your acute post-operative pain control for the first 6 weeks postoperatively, with a plan to transition you back to your primary pain team following that. Maralee will work to ensure a Therapist, occupational.

## 2023-12-22 NOTE — Progress Notes (Signed)
   There were no vitals taken for this visit.  There is no height or weight on file to calculate BMI.  Chief Complaint  Patient presents with   Post-op Follow-up    Left hip     Encounter Diagnosis  Name Primary?   Displaced fracture of left femoral neck (HCC) 10/14/22 bipolar replacement Yes    DOI/DOS/ Date: 10/08/23  Improved / is standing and walking

## 2023-12-23 DIAGNOSIS — E46 Unspecified protein-calorie malnutrition: Secondary | ICD-10-CM | POA: Diagnosis not present

## 2023-12-23 DIAGNOSIS — E871 Hypo-osmolality and hyponatremia: Secondary | ICD-10-CM | POA: Diagnosis not present

## 2023-12-23 DIAGNOSIS — E039 Hypothyroidism, unspecified: Secondary | ICD-10-CM | POA: Diagnosis not present

## 2023-12-23 DIAGNOSIS — K7460 Unspecified cirrhosis of liver: Secondary | ICD-10-CM | POA: Diagnosis not present

## 2023-12-23 DIAGNOSIS — S72141D Displaced intertrochanteric fracture of right femur, subsequent encounter for closed fracture with routine healing: Secondary | ICD-10-CM | POA: Diagnosis not present

## 2023-12-23 DIAGNOSIS — R339 Retention of urine, unspecified: Secondary | ICD-10-CM | POA: Diagnosis not present

## 2023-12-23 DIAGNOSIS — E43 Unspecified severe protein-calorie malnutrition: Secondary | ICD-10-CM | POA: Diagnosis not present

## 2023-12-23 DIAGNOSIS — K7682 Hepatic encephalopathy: Secondary | ICD-10-CM | POA: Diagnosis not present

## 2023-12-23 DIAGNOSIS — E8809 Other disorders of plasma-protein metabolism, not elsewhere classified: Secondary | ICD-10-CM | POA: Diagnosis not present

## 2023-12-24 ENCOUNTER — Other Ambulatory Visit: Payer: Self-pay | Admitting: Family Medicine

## 2023-12-24 DIAGNOSIS — I10 Essential (primary) hypertension: Secondary | ICD-10-CM

## 2023-12-26 DIAGNOSIS — N390 Urinary tract infection, site not specified: Secondary | ICD-10-CM | POA: Diagnosis not present

## 2023-12-26 DIAGNOSIS — R339 Retention of urine, unspecified: Secondary | ICD-10-CM | POA: Diagnosis not present

## 2023-12-26 DIAGNOSIS — S72141D Displaced intertrochanteric fracture of right femur, subsequent encounter for closed fracture with routine healing: Secondary | ICD-10-CM | POA: Diagnosis not present

## 2023-12-26 DIAGNOSIS — E43 Unspecified severe protein-calorie malnutrition: Secondary | ICD-10-CM | POA: Diagnosis not present

## 2023-12-26 DIAGNOSIS — K7460 Unspecified cirrhosis of liver: Secondary | ICD-10-CM | POA: Diagnosis not present

## 2023-12-26 DIAGNOSIS — E871 Hypo-osmolality and hyponatremia: Secondary | ICD-10-CM | POA: Diagnosis not present

## 2023-12-26 DIAGNOSIS — E8809 Other disorders of plasma-protein metabolism, not elsewhere classified: Secondary | ICD-10-CM | POA: Diagnosis not present

## 2023-12-26 DIAGNOSIS — E039 Hypothyroidism, unspecified: Secondary | ICD-10-CM | POA: Diagnosis not present

## 2023-12-26 DIAGNOSIS — K7682 Hepatic encephalopathy: Secondary | ICD-10-CM | POA: Diagnosis not present

## 2023-12-27 ENCOUNTER — Ambulatory Visit: Admitting: Physician Assistant

## 2023-12-27 DIAGNOSIS — E871 Hypo-osmolality and hyponatremia: Secondary | ICD-10-CM | POA: Diagnosis not present

## 2023-12-28 DIAGNOSIS — K7460 Unspecified cirrhosis of liver: Secondary | ICD-10-CM | POA: Diagnosis not present

## 2023-12-28 DIAGNOSIS — E039 Hypothyroidism, unspecified: Secondary | ICD-10-CM | POA: Diagnosis not present

## 2023-12-28 DIAGNOSIS — K7682 Hepatic encephalopathy: Secondary | ICD-10-CM | POA: Diagnosis not present

## 2023-12-28 DIAGNOSIS — R339 Retention of urine, unspecified: Secondary | ICD-10-CM | POA: Diagnosis not present

## 2023-12-28 DIAGNOSIS — E8809 Other disorders of plasma-protein metabolism, not elsewhere classified: Secondary | ICD-10-CM | POA: Diagnosis not present

## 2023-12-28 DIAGNOSIS — S72141D Displaced intertrochanteric fracture of right femur, subsequent encounter for closed fracture with routine healing: Secondary | ICD-10-CM | POA: Diagnosis not present

## 2023-12-28 DIAGNOSIS — E871 Hypo-osmolality and hyponatremia: Secondary | ICD-10-CM | POA: Diagnosis not present

## 2023-12-28 DIAGNOSIS — E43 Unspecified severe protein-calorie malnutrition: Secondary | ICD-10-CM | POA: Diagnosis not present

## 2023-12-28 DIAGNOSIS — N390 Urinary tract infection, site not specified: Secondary | ICD-10-CM | POA: Diagnosis not present

## 2023-12-30 ENCOUNTER — Telehealth: Payer: Self-pay

## 2023-12-30 NOTE — Telephone Encounter (Signed)
 Copied from CRM #[id number]. Topic: General - Other >> Dec 30, 2023 12:18 PM Rachelle R wrote: Reason for CRM: Shona from [facility] calling to confirm that Dr Gasper is going to sign off on the plan of care after the patient has been assessed.   Shona can be reached at [phone]

## 2023-12-30 NOTE — Transitions of Care (Post Inpatient/ED Visit) (Signed)
   12/30/2023  Name: Lauren Lawrence Avis MRN: [id number] DOB: 10/29/1940  Today's TOC FU Call Status: Today's TOC FU Call Status:: Successful TOC FU Call Completed TOC FU Call Complete Date: 12/30/23 Patient's Name and Date of Birth confirmed.  Transition Care Management Follow-up Telephone Call Date of Discharge: 12/29/23 Discharge Facility: Other ([facility]) Name of Other ([facility]) Discharge Facility: [facility] Type of Discharge: Inpatient Admission Primary Inpatient Discharge Diagnosis:: dementia How have you been since you were released from the hospital?: Same Any questions or concerns?: No  Items Reviewed: Did you receive and understand the discharge instructions provided?: Yes Medications obtained,verified, and reconciled?: Yes (Medications Reviewed) Any new allergies since your discharge?: No Dietary orders reviewed?: Yes Do you have support at home?: Yes People in Home [RPT]: child(ren), adult  Medications Reviewed Today: Medications Reviewed Today   Medications were not reviewed in this encounter     Home Care and Equipment/Supplies: Were Home Health Services Ordered?: Yes Name of Home Health Agency:: unknown Has Agency set up a time to come to your home?: No Any new equipment or medical supplies ordered?: NA  Functional Questionnaire: Do you need assistance with bathing/showering or dressing?: Yes Do you need assistance with meal preparation?: Yes Do you need assistance with eating?: No Do you have difficulty maintaining continence: Yes Do you need assistance with getting out of bed/getting out of a chair/moving?: No Do you have difficulty managing or taking your medications?: Yes  Follow up appointments reviewed: PCP Follow-up appointment confirmed?: No (will call back to schedule) MD Provider Line Number:[phone] Given: No Specialist Hospital Follow-up appointment confirmed?: NA Do you need transportation to your follow-up appointment?: No Do you  understand care options if your condition(s) worsen?: Yes-patient verbalized understanding    SIGNATURE Julian Lemmings, LPN [facility] Nurse Health Advisor Direct Dial [phone]

## 2024-01-02 ENCOUNTER — Telehealth: Payer: Self-pay

## 2024-01-02 NOTE — Telephone Encounter (Signed)
 Copied from CRM [id number]. Topic: Clinical - Home Health Verbal Orders >> Jan 02, 2024  2:21 PM Precious C wrote: Caller/Agency: Josh/ [facility] Callback Number: [phone] Service Requested: Occupational Therapy and Physical Therapy   Any new concerns about the patient? Yes, pt had a unspecified fracture of right femur

## 2024-01-03 DIAGNOSIS — I10 Essential (primary) hypertension: Secondary | ICD-10-CM | POA: Diagnosis not present

## 2024-01-03 DIAGNOSIS — F0284 Dementia in other diseases classified elsewhere, unspecified severity, with anxiety: Secondary | ICD-10-CM | POA: Diagnosis not present

## 2024-01-03 DIAGNOSIS — S72141D Displaced intertrochanteric fracture of right femur, subsequent encounter for closed fracture with routine healing: Secondary | ICD-10-CM | POA: Diagnosis not present

## 2024-01-03 DIAGNOSIS — G309 Alzheimer's disease, unspecified: Secondary | ICD-10-CM | POA: Diagnosis not present

## 2024-01-03 DIAGNOSIS — F0283 Dementia in other diseases classified elsewhere, unspecified severity, with mood disturbance: Secondary | ICD-10-CM | POA: Diagnosis not present

## 2024-01-03 DIAGNOSIS — K7030 Alcoholic cirrhosis of liver without ascites: Secondary | ICD-10-CM | POA: Diagnosis not present

## 2024-01-03 DIAGNOSIS — I8510 Secondary esophageal varices without bleeding: Secondary | ICD-10-CM | POA: Diagnosis not present

## 2024-01-03 DIAGNOSIS — K7682 Hepatic encephalopathy: Secondary | ICD-10-CM | POA: Diagnosis not present

## 2024-01-03 DIAGNOSIS — F32A Depression, unspecified: Secondary | ICD-10-CM | POA: Diagnosis not present

## 2024-01-03 NOTE — Telephone Encounter (Signed)
 Ok for home PT/OT, but I have not seen her for this problem so she will need to be seen in the office within 30 days

## 2024-01-03 NOTE — Telephone Encounter (Signed)
 Josh with [facility] calling to follow up on order request.   Requesting callback: [phone]

## 2024-01-03 NOTE — Telephone Encounter (Signed)
 Advised

## 2024-01-05 ENCOUNTER — Telehealth: Payer: Self-pay

## 2024-01-05 DIAGNOSIS — R6251 Failure to thrive (child): Secondary | ICD-10-CM

## 2024-01-05 DIAGNOSIS — E43 Unspecified severe protein-calorie malnutrition: Secondary | ICD-10-CM

## 2024-01-05 NOTE — Telephone Encounter (Signed)
 Copied from CRM [id number]. Topic: Referral - Question >> Jan 05, 2024  3:07 PM Carlatta H wrote: Reason for CRM: Patient daughter call to inquire about getting a referral for hospice//Please call patient daughter tanya [phone]//

## 2024-01-06 NOTE — Addendum Note (Signed)
 Addended by: GASPER GOLAS E on: 01/06/2024 12:11 PM   Modules accepted: Orders

## 2024-01-09 DIAGNOSIS — R269 Unspecified abnormalities of gait and mobility: Secondary | ICD-10-CM | POA: Diagnosis not present

## 2024-01-09 DIAGNOSIS — R2689 Other abnormalities of gait and mobility: Secondary | ICD-10-CM | POA: Diagnosis not present

## 2024-01-09 DIAGNOSIS — R630 Anorexia: Secondary | ICD-10-CM | POA: Diagnosis not present

## 2024-01-09 DIAGNOSIS — G309 Alzheimer's disease, unspecified: Secondary | ICD-10-CM | POA: Diagnosis not present

## 2024-01-09 DIAGNOSIS — G479 Sleep disorder, unspecified: Secondary | ICD-10-CM | POA: Diagnosis not present

## 2024-01-09 DIAGNOSIS — F0280 Dementia in other diseases classified elsewhere without behavioral disturbance: Secondary | ICD-10-CM | POA: Diagnosis not present

## 2024-01-13 DIAGNOSIS — S72141D Displaced intertrochanteric fracture of right femur, subsequent encounter for closed fracture with routine healing: Secondary | ICD-10-CM | POA: Diagnosis not present

## 2024-01-24 ENCOUNTER — Ambulatory Visit

## 2024-02-14 ENCOUNTER — Other Ambulatory Visit: Payer: Self-pay | Admitting: Family Medicine

## 2024-02-14 DIAGNOSIS — I10 Essential (primary) hypertension: Secondary | ICD-10-CM

## 2024-02-16 ENCOUNTER — Encounter: Admitting: Orthopedic Surgery

## 2024-02-18 DEATH — deceased
